# Patient Record
Sex: Female | Born: 1980 | Race: White | Hispanic: No | Marital: Married | Smoking: Current every day smoker
Health system: Southern US, Community
[De-identification: ages and names within clinical notes are randomized; demographics above are authoritative.]

## PROBLEM LIST (undated history)

## (undated) ENCOUNTER — Inpatient Hospital Stay (HOSPITAL_COMMUNITY): Payer: Self-pay

## (undated) DIAGNOSIS — I10 Essential (primary) hypertension: Secondary | ICD-10-CM

## (undated) DIAGNOSIS — F419 Anxiety disorder, unspecified: Secondary | ICD-10-CM

## (undated) DIAGNOSIS — F32A Depression, unspecified: Secondary | ICD-10-CM

## (undated) DIAGNOSIS — Z3490 Encounter for supervision of normal pregnancy, unspecified, unspecified trimester: Secondary | ICD-10-CM

## (undated) DIAGNOSIS — F909 Attention-deficit hyperactivity disorder, unspecified type: Secondary | ICD-10-CM

## (undated) DIAGNOSIS — N898 Other specified noninflammatory disorders of vagina: Principal | ICD-10-CM

## (undated) DIAGNOSIS — D0512 Intraductal carcinoma in situ of left breast: Secondary | ICD-10-CM

## (undated) DIAGNOSIS — F329 Major depressive disorder, single episode, unspecified: Secondary | ICD-10-CM

## (undated) DIAGNOSIS — Z87898 Personal history of other specified conditions: Secondary | ICD-10-CM

## (undated) DIAGNOSIS — B379 Candidiasis, unspecified: Secondary | ICD-10-CM

## (undated) HISTORY — DX: Candidiasis, unspecified: B37.9

## (undated) HISTORY — DX: Personal history of other specified conditions: Z87.898

## (undated) HISTORY — DX: Other specified noninflammatory disorders of vagina: N89.8

## (undated) HISTORY — DX: Essential (primary) hypertension: I10

## (undated) HISTORY — PX: CHOLECYSTECTOMY: SHX55

## (undated) HISTORY — DX: Encounter for supervision of normal pregnancy, unspecified, unspecified trimester: Z34.90

---

## 2001-06-03 ENCOUNTER — Emergency Department (HOSPITAL_COMMUNITY): Admission: EM | Admit: 2001-06-03 | Discharge: 2001-06-03 | Payer: Self-pay | Admitting: Internal Medicine

## 2001-06-03 ENCOUNTER — Inpatient Hospital Stay (HOSPITAL_COMMUNITY): Admission: EM | Admit: 2001-06-03 | Discharge: 2001-06-04 | Payer: Self-pay | Admitting: Emergency Medicine

## 2001-06-03 ENCOUNTER — Ambulatory Visit (HOSPITAL_COMMUNITY): Admission: RE | Admit: 2001-06-03 | Discharge: 2001-06-03 | Payer: Self-pay | Admitting: Internal Medicine

## 2001-06-03 ENCOUNTER — Encounter: Payer: Self-pay | Admitting: Internal Medicine

## 2006-06-26 ENCOUNTER — Emergency Department (HOSPITAL_COMMUNITY): Admission: EM | Admit: 2006-06-26 | Discharge: 2006-06-26 | Payer: Self-pay | Admitting: Emergency Medicine

## 2006-12-31 ENCOUNTER — Emergency Department (HOSPITAL_COMMUNITY): Admission: EM | Admit: 2006-12-31 | Discharge: 2007-01-01 | Payer: Self-pay | Admitting: Emergency Medicine

## 2007-06-21 ENCOUNTER — Ambulatory Visit: Payer: Self-pay | Admitting: Obstetrics and Gynecology

## 2007-06-21 ENCOUNTER — Inpatient Hospital Stay (HOSPITAL_COMMUNITY): Admission: AD | Admit: 2007-06-21 | Discharge: 2007-06-22 | Payer: Self-pay | Admitting: Obstetrics & Gynecology

## 2007-07-12 ENCOUNTER — Inpatient Hospital Stay (HOSPITAL_COMMUNITY): Admission: AD | Admit: 2007-07-12 | Discharge: 2007-07-13 | Payer: Self-pay | Admitting: Obstetrics and Gynecology

## 2007-07-14 ENCOUNTER — Inpatient Hospital Stay (HOSPITAL_COMMUNITY): Admission: AD | Admit: 2007-07-14 | Discharge: 2007-07-15 | Payer: Self-pay | Admitting: Obstetrics and Gynecology

## 2007-07-22 ENCOUNTER — Ambulatory Visit: Payer: Self-pay | Admitting: Family

## 2007-07-22 ENCOUNTER — Inpatient Hospital Stay (HOSPITAL_COMMUNITY): Admission: AD | Admit: 2007-07-22 | Discharge: 2007-07-24 | Payer: Self-pay | Admitting: Obstetrics & Gynecology

## 2007-08-03 ENCOUNTER — Inpatient Hospital Stay (HOSPITAL_COMMUNITY): Admission: AD | Admit: 2007-08-03 | Discharge: 2007-08-06 | Payer: Self-pay | Admitting: Obstetrics & Gynecology

## 2007-08-03 ENCOUNTER — Ambulatory Visit: Payer: Self-pay | Admitting: *Deleted

## 2007-08-04 ENCOUNTER — Encounter: Payer: Self-pay | Admitting: Obstetrics & Gynecology

## 2009-05-06 ENCOUNTER — Other Ambulatory Visit: Admission: RE | Admit: 2009-05-06 | Discharge: 2009-05-06 | Payer: Self-pay | Admitting: Obstetrics and Gynecology

## 2009-07-05 ENCOUNTER — Emergency Department (HOSPITAL_COMMUNITY): Admission: EM | Admit: 2009-07-05 | Discharge: 2009-07-06 | Payer: Self-pay | Admitting: Emergency Medicine

## 2009-08-17 ENCOUNTER — Ambulatory Visit: Payer: Self-pay | Admitting: Obstetrics and Gynecology

## 2009-08-17 ENCOUNTER — Inpatient Hospital Stay (HOSPITAL_COMMUNITY): Admission: AD | Admit: 2009-08-17 | Discharge: 2009-08-17 | Payer: Self-pay | Admitting: Obstetrics & Gynecology

## 2009-10-26 ENCOUNTER — Inpatient Hospital Stay (HOSPITAL_COMMUNITY): Admission: AD | Admit: 2009-10-26 | Discharge: 2009-10-26 | Payer: Self-pay | Admitting: Obstetrics and Gynecology

## 2009-11-22 ENCOUNTER — Ambulatory Visit: Payer: Self-pay | Admitting: Advanced Practice Midwife

## 2009-11-22 ENCOUNTER — Inpatient Hospital Stay (HOSPITAL_COMMUNITY): Admission: AD | Admit: 2009-11-22 | Discharge: 2009-11-23 | Payer: Self-pay | Admitting: Obstetrics & Gynecology

## 2009-11-30 ENCOUNTER — Emergency Department (HOSPITAL_COMMUNITY): Admission: EM | Admit: 2009-11-30 | Discharge: 2009-12-01 | Payer: Self-pay | Admitting: Emergency Medicine

## 2009-12-08 ENCOUNTER — Ambulatory Visit: Payer: Self-pay | Admitting: Obstetrics and Gynecology

## 2009-12-08 ENCOUNTER — Inpatient Hospital Stay (HOSPITAL_COMMUNITY): Admission: AD | Admit: 2009-12-08 | Discharge: 2009-12-08 | Payer: Self-pay | Admitting: Obstetrics & Gynecology

## 2009-12-15 ENCOUNTER — Inpatient Hospital Stay (HOSPITAL_COMMUNITY): Admission: AD | Admit: 2009-12-15 | Discharge: 2009-12-15 | Payer: Self-pay | Admitting: Obstetrics & Gynecology

## 2009-12-20 ENCOUNTER — Inpatient Hospital Stay (HOSPITAL_COMMUNITY): Admission: AD | Admit: 2009-12-20 | Discharge: 2009-12-22 | Payer: Self-pay | Admitting: Obstetrics and Gynecology

## 2009-12-20 ENCOUNTER — Ambulatory Visit: Payer: Self-pay | Admitting: Obstetrics and Gynecology

## 2010-08-30 LAB — URINE MICROSCOPIC-ADD ON

## 2010-08-30 LAB — BASIC METABOLIC PANEL
CO2: 24 mEq/L (ref 19–32)
Calcium: 8.8 mg/dL (ref 8.4–10.5)
Chloride: 106 mEq/L (ref 96–112)
GFR calc Af Amer: >60 mL/min (ref >60)
Sodium: 137 mEq/L (ref 135–145)

## 2010-08-30 LAB — RH IMMUNE GLOB WKUP(>/=20WKS)(NOT WOMEN'S HOSP): Fetal Screen: NEGATIVE

## 2010-08-30 LAB — URINALYSIS, ROUTINE W REFLEX MICROSCOPIC
Bilirubin Urine: NEGATIVE
Bilirubin Urine: NEGATIVE
Bilirubin Urine: NEGATIVE
Glucose, UA: NEGATIVE mg/dL
Glucose, UA: 100 mg/dL — AB
Ketones, ur: NEGATIVE mg/dL
Nitrite: NEGATIVE
Protein, ur: NEGATIVE mg/dL
Protein, ur: NEGATIVE mg/dL
Specific Gravity, Urine: 1.01 (ref 1.005–1.030)
Specific Gravity, Urine: 1.025 (ref 1.005–1.030)
Urobilinogen, UA: 0.2 mg/dL (ref 0.0–1.0)
Urobilinogen, UA: 0.2 mg/dL (ref 0.0–1.0)
Urobilinogen, UA: 0.2 mg/dL (ref 0.0–1.0)
pH: 6.5 (ref 5.0–8.0)
pH: 6.5 (ref 5.0–8.0)

## 2010-08-30 LAB — CBC
HCT: 36.8 % (ref 36.0–46.0)
Hemoglobin: 12.1 g/dL (ref 12.0–15.0)
Hemoglobin: 11.1 g/dL — ABNORMAL LOW (ref 12.0–15.0)
MCH: 27.6 pg (ref 26.0–34.0)
MCHC: 32.8 g/dL (ref 30.0–36.0)
MCHC: 33.1 g/dL (ref 30.0–36.0)
MCV: 84.2 fL (ref 78.0–100.0)
MCV: 84.6 fL (ref 78.0–100.0)
Platelets: 287 10*3/uL (ref 150–400)
RBC: 4.37 MIL/uL (ref 3.87–5.11)
RBC: 3.98 MIL/uL (ref 3.87–5.11)
RDW: 16.2 % — ABNORMAL HIGH (ref 11.5–15.5)
WBC: 11.5 10*3/uL — ABNORMAL HIGH (ref 4.0–10.5)
WBC: 14.1 10*3/uL — ABNORMAL HIGH (ref 4.0–10.5)

## 2010-08-30 LAB — DIFFERENTIAL
Basophils Relative: 0 % (ref 0–1)
Lymphs Abs: 2.6 10*3/uL (ref 0.7–4.0)
Monocytes Absolute: 1 10*3/uL (ref 0.1–1.0)
Monocytes Relative: 7 % (ref 3–12)
Neutro Abs: 10.4 10*3/uL — ABNORMAL HIGH (ref 1.7–7.7)

## 2010-08-30 LAB — RPR: RPR Ser Ql: NONREACTIVE

## 2010-08-31 LAB — URINALYSIS, ROUTINE W REFLEX MICROSCOPIC
Leukocytes, UA: NEGATIVE
Nitrite: NEGATIVE
Protein, ur: NEGATIVE mg/dL
Specific Gravity, Urine: 1.015 (ref 1.005–1.030)
Urobilinogen, UA: 1 mg/dL (ref 0.0–1.0)

## 2010-08-31 LAB — CBC
HCT: 32.8 % — ABNORMAL LOW (ref 36.0–46.0)
MCHC: 34.2 g/dL (ref 30.0–36.0)
MCV: 87.1 fL (ref 78.0–100.0)
Platelets: 275 10*3/uL (ref 150–400)

## 2010-08-31 LAB — URINE MICROSCOPIC-ADD ON

## 2010-09-06 LAB — CBC
Hemoglobin: 11.7 g/dL — ABNORMAL LOW (ref 12.0–15.0)
MCHC: 33.6 g/dL (ref 30.0–36.0)
Platelets: 338 10*3/uL (ref 150–400)
RDW: 13.4 % (ref 11.5–15.5)

## 2010-09-06 LAB — GLUCOSE, CAPILLARY: Glucose-Capillary: 92 mg/dL (ref 70–99)

## 2010-10-27 NOTE — Op Note (Signed)
[name], Marissa Reese          ACCOUNT NO.:  [id number]   MEDICAL RECORD NO.:  [id number]          PATIENT TYPE:  EMS   LOCATION:  MAJO                         FACILITY:  [facility]   PHYSICIAN:  Lucky Cowboy, MD         DATE OF BIRTH:  02-28-1981   DATE OF PROCEDURE:  12/31/2006  DATE OF DISCHARGE:                               OPERATIVE REPORT   PREOPERATIVE DIAGNOSIS:  Complex stellate forehead laceration.   POSTOPERATIVE DIAGNOSIS:  Complex stellate forehead laceration.   PROCEDURE:  Closure, complex stellate forehead laceration.   SURGEON:  Lucky Cowboy, MD   ANESTHESIA:  General.   ESTIMATED BLOOD LOSS:  Less than 30 mL.   SPECIMENS:  None.   COMPLICATIONS:  None.   INDICATIONS:  This patient is a 30 year old female who was hit in the  forehead with a foul ball at the [employer] game in the fourth inning  tonight.  There was no loss of consciousness.  She was sitting past  first base.  Dr. Margarita Grizzle, emergency room physician, requests  closure.   FINDINGS:  The wound was 2.5 to the right and 2.2 to the left, with an  inferior limb of 2 cm.  This made a Y-shaped laceration.   PROCEDURE:  The patient was seen in the emergency room.  The procedure  and anticipated scarring level was discussed with her.  The area was  first injected with 1% lidocaine with 1:100,000 of epinephrine.  The  total amount was 6 mL.  The wound was prepped with Betadine and draped  in the usual sterile fashion.  Saline irrigation was performed.  Monocryl 4-0 was then used in a simple interrupted buried fashion to  reapproximate the frontalis muscle.  In one location 4-0 Vicryl was  used.  The skin was closed in a simple interrupted fashion using 5-0  Prolene.  Bacitracin was applied.  The patient was discharged home.   DISCHARGE INSTRUCTIONS:  Tylenol as needed.  Check with obstetrician as  she is [redacted] weeks pregnant.  She needs to check with regard to the dose.  Keep wound dry until suture  removal.  Apply bacitracin ointment t.i.d.  elevate head and apply ice for the next 24 hours.  She will call the  office for suture removal in 5 days.      Lucky Cowboy, MD  Electronically Signed     SJ/MEDQ  D:  12/31/2006  T:  01/01/2007  Job:  [id number]   cc:   [facility]  Clifton Custard

## 2010-10-27 NOTE — H&P (Signed)
NAMEPIXIE, BURGENER          ACCOUNT NO.:  [id number]   MEDICAL RECORD NO.:  [id number]          PATIENT TYPE:  INP   LOCATION:  9151                          FACILITY:  [facility]   PHYSICIAN:  Tilda Burrow, M.D. DATE OF BIRTH:  12-Mar-1981   DATE OF ADMISSION:  07/22/2007  DATE OF DISCHARGE:  07/24/2007                              HISTORY & PHYSICAL   ADMISSION DIAGNOSES:  1. Pregnancy at [redacted] weeks gestation.  2. Gestational hypertension.  3. Cervical favorability.  4. Medically indicate induction of labor.   HISTORY OF PRESENT ILLNESS:  This 30 year old female gravida 1, para 0,  has been followed through our office with a pregnancy course notable for  the complication of gestational hypertension which was first noted at [redacted]  weeks gestation when blood pressures went from the first trimester  values of 112-120/60-72 to a high of 152/88 followed by blood pressures  of 140/80, 150/90, 158/100, etc., though none of them were accompanied  by proteinuria, headaches, scatoma or right upper quadrant pain.  She  has had 24 hour urine collections for total protein which have ranged  from 164 mg to 210 mg per day.  Early February, she did have  intermittent headaches and blurry vision, but liver function tests and  PIH labs remained normal.  At home, her blood pressures are consistently  90 diastolic.  The cervix has improved significantly.  It is now 2-3 cm,  20%, -2.  Case has been discussed by Dr. Silas Flood and myself.  I agree with  his opinion that delivery at this time is indicated.  The patient is  therefore scheduled for Foley bulb cervical ripening on Sunday, August 06, 2007 at which time she is [redacted] weeks gestation for Foley bulb cervical  ripening and pitocin induction of labor.  She has been seen in the  [facility] [place] once this week to rule out labor.   PAST MEDICAL HISTORY:  Benign.   PAST SURGICAL HISTORY:  Negative.   ALLERGIES:  NONE.   SOCIAL HISTORY:   Cigarettes:  1-2 cigarettes per day.  Alcohol and  recreational drugs denied.  She is married and unemployed.   LABORATORY DATA:  Prenatal labs as mentioned earlier include blood type  A- for which RhoGAM was managed through June 13, 2007, rubella  immune antigen present, hemoglobin 14, hematocrit 42.  Hepatitis, HIV,  RPR, GC and chlamydia all negative.  Repeat RPR negative x 2.  Glucose  tolerance test 138 mg percent.  As previously mentioned, PIH labs have  been checked multiple times over the past several weeks and have shown  normal proteinuria and normal liver function tests.  Platelets were  335,000 at last check.   PLAN:  Admit for induction at [redacted] weeks gestation due to concerns  associated with gestational hypertension progressing as she reaches her  due date.      Tilda Burrow, M.D.  Electronically Signed     JVF/MEDQ  D:  08/02/2007  T:  08/03/2007  Job:  [id number]   cc:   [facility]

## 2010-10-30 NOTE — Consult Note (Signed)
[facility]  Patient:    Marissa Reese, Marissa Reese Visit Number: [id number] MRN: [id number]          Service Type: EMS Location: ED Attending Physician:  Cassell Smiles. Dictated by:   Langley Gauss, M.D. Proc. Date: 06/03/01 Admit Date:  06/03/2001 Discharge Date: 06/03/2001   CC:         Patrica Duel, M.D.   Consultation Report  BRIEF HISTORY:  The patient is a 30 year old with a last menstrual period of May 14, 2001, not utilizing any birth control who presented to [facility] emergency room with the complaints of fever and flank pain.  She stated to me that this had been occurring x 5 days duration.  One episode of vomiting the day prior to admission.  Patients initial evaluation in the emergency room supported the diagnosis of pyelonephritis, however, notable also was a positive serum qualitative beta hCG which was obtained.  Thus the patient required a pelvic ultrasound to evaluate the status of the pregnancy in addition to a renal and gallbladder ultrasound.  Due to the nonavailability of [facility] to perform or provide any of these services on an after hours basis the patient was referred by ambulance to [facility] in [place], at which time the ultrasounds were performed as requested and then she was then returned to [facility] emergency room for continued care.  Review of the ER record indicates that the patient was placed in a treatment room at 01:25 a.m. at 03:00 due to the obtaining of a positive qualitative beta hCG. Dr. Sherwood Gambler discussed pregnancy with the family and possible infection of some kind.  At 03:10 Dr. Sherwood Gambler talked with radiology at [facility] to see about getting an ultrasound performed in the a.m.  At 06:32 communications to transfer the patient to [facility] for ultrasound and transfer back to [facility] were arranged.  The patient did complete paper work  for transfer to [facility] and back.  The patient had received IV fluids during the night as well as pain medication and thus pain was markedly diminished at this time.  The patient returned from [facility] at 10:30 a.m.  Travel from [facility] to [facility] 09:53 with arrival at 10:23.  The times states previously upon review indicate that the patient actually returned and arrived at [facility] emergency room at 10:23 a.m. with the report of a negative gallbladder and renal ultrasound and a hemorrhagic corpus luteum only identified on the pelvic ultrasound with no findings of pregnancy.  LABORATORY DATA:  Pertinent laboratory studies at this time included the previously mentioned qualitative serum hCG June 03, 2001, at 01:49 reported as positive.  Of note, at the same time June 03, 2001, at 01:49 a.m. collection qualitative beta hCG was reported as less than 2.  This was ordered at 10:31 a.m. on the blood which had been collected at 01:49 a.m.  Thus the two tests were run on the same blood sample with conflicting results. At that point in time when Dr. Nobie Putnam evaluated the patient in the emergency room it became somewhat apparent that she was not pregnant.  Prior to my seeing the patient on the third floor of the hospital another qualitative beta hCG was performed June 03, 2001, at 12:41, quantitative beta hCG was again less than 2.0.  This was reported at 13:23.  Due to the previously mentioned positive qualitative beta hCG now  conflicting results I was consulted to see the patient by Dr. Patrica Duel.  The patient was admitted by him with the diagnosis of pyelonephritis and sent to the third floor.  PAST MEDICAL HISTORY:  She denies any prior medical or surgical history.  She denies any medical illnesses.  CURRENT MEDICATIONS:  None.  ALLERGIES:  The patient states no known drug allergies.  SOCIAL HISTORY:  She is a  nonsmoker.  PHYSICAL EXAMINATION:  GENERAL:  Revealed a very lethargic sleepy patient on the third floor of the hospital.  VITAL SIGNS:  T-max had been 102.3, pulse of 101, respiratory rate 18, blood pressure 136/76.  HEENT:  Reveals no adenopathy.  NECK:  Supple.  Thyroid is nonpalpable.  Mucous membranes moist.  LUNGS/CARDIOVASCULAR:  Clear to auscultation.  Regular rate and rhythm of the heart.  No murmurs or gallops identified.  ABDOMEN:  Abdomen soft, nontender.  Likewise, pelvic area is soft and nontender with no pelvic or abdominal masses identified.  Very mild right adnexal tenderness suprapubically.  Right flank pain is identified.  LABORATORY DATA:  Quantitative and qualitative beta hCGs as discussed previously.  White blood count 10,500, 77 segs, 11 lymphs, 0 bands. Hemoglobin 13.5, hematocrit 38.7, electrolytes essentially within normal limits.  Urinalysis pertinent for rare epithelial cells.  Many bacteria. Trace esterase.  ASSESSMENT:  Initial presentation of the patient was very concerning for ectopic pregnancy particularly when the qualitative beta hCG was reported as positive.  The patient thereafter had a ultrasound revealing a hemorrhagic corpus luteum and no intrauterine pregnancy.  Again, markedly elevating our concern of an ectopic pregnancy, however, the quantitative beta hCG performed on the same blood obtained in the emergency room was now reported as less than 2.  Thus, completely ruling out an ectopic pregnancy.  However, due to conflicting results quantitative beta hCG was again requested by Dr. Patrica Duel and this was done on a new blood sample and again, was reported as less than 2.  Thus, predominance of the data specifically 2 quantitatives which were negative and 1 qualitative of positive indicates most likely patient is not pregnant, thus completely ruling out the ectopic pregnancy.  Patient, however,  is noted to have right  pyelonephritis with the nausea and vomiting and unable to tolerate p.o. thus she was admitted by Dr. Patrica Duel for intravenous fluid hydration as well as treatment with treatment with Rocephin.  When I was consulted and saw the patient on June 03, 2001, I agreed with that diagnosis and informed the patient of the conflicting laboratory results and gave her my final conclusion that she was not pregnant.  The patient did require treatment at that time for flank pain with p.o. Tylox for which she did very well.  The patient did well throughout the evening.  Did very well taking the p.o. Tylox for pain relief.  She was noted to have fever and chills that evening when I evaluated her.  HOSPITAL DAY #1: Subjective:  The patient is noted to be sleeping very soundly in the room stating that her pain is markedly improved with minimal right flank pain and ability to sleep very easily.  The patient denies any difficulty with voiding.  She denies any vaginal bleeding or vaginal discharge.  OBJECTIVE:  Temperature at 04:00 97.5 on June 03, 2001.  On December 22,2002, temperature 98.7, pulse 72, respiratory rate 18, blood pressure 101/59.  ABDOMEN: Soft, nontender.  No pelvic masses, no pelvic tenderness or abdominal tenderness elicited.  Markedly decreased  right flank pain.  LABORATORY DATA:  Accu-Chek 90.  I had requested that this a.m. a qualitative beta hCG be performed and if this was positive a quantitative beta hCG to be performed, however, these orders were not followed, rather only the results for a quantitative beta hCG are available which again reveal this to be less than 2.  Thus the third negative pregnancy test reveals the predominance of the available data to support the diagnosis that the patient is not pregnant thus intrauterine pregnancy and no possibility of an ectopic pregnancy.  Thus the diagnosis remains pyelonephritis.  Patient responding very well to the  IV Rocephin as well as the intravenous fluids for hydration therapy and flushing of the kidneys.  From a gynecological or obstetrical standpoint the patient is ready for discharge when her pyelonephritis is treatable as an outpatient.  Patient can follow up in our office if she desires for ongoing gynecological care and discussion of birth control if she would like to initiate its use.  I thank Dr. Nobie Putnam for asking me to consult on Jeremy Mclamb. Dictated by:   Langley Gauss, M.D. Attending Physician:  Cassell Smiles DD:  06/04/01 TD:  06/05/01 Job: [id number]

## 2010-10-30 NOTE — H&P (Signed)
[facility]  Patient:    Marissa Reese, Marissa Reese Visit Number: [id number] MRN: [id number]          Service Type: MED Location: 3A A313 01 Attending Physician:  Herbert Seta Dictated by:   Patrica Duel, M.D. Admit Date:  06/03/2001                           History and Physical  CHIEF COMPLAINT:  Fever and flank pain.  HISTORY OF PRESENT ILLNESS:  This is a 30 year old female with a completely negative past history.  The patient presented to the emergency department in the early morning hours complaining of right flank and back pain x 5 days.  She also had one episode of vomiting the day prior to admission and fever.  Preliminary evaluation revealed minimal pyuria but associated with a few rare epithelial cells and many bacteria on urinalysis.  She also had a minimal leukocytosis and a positive serum hCG.  Dr. Sherwood Gambler noticed a significant amount of right CVA tenderness.  She was sent to [facility] for emergent ultrasound of her liver, kidneys, gallbladder, and pelvis.  A verbal report received by Dr. Hulan Saas reported a hemorrhagic ovarian cyst on the right only.  Kidneys, ureters, liver, and gallbladder normal.  A quantitative hCG has been done which is less than 2, which brings in question the positive serum pregnancy test.  The patient denies significant urinary tract symptoms, vaginal discharge, chest pain, shortness of breath, headache, any neurologic deficits, syncope, melena, hematemesis, hematochezia.  The patient is admitted for apparent pyelonephritis and gynecologic evaluation.  PAST MEDICAL HISTORY:  As noted above.  ALLERGIES:  None known.  FAMILY HISTORY:  Noncontributory.  REVIEW OF SYSTEMS:  Negative except as mentioned.  PHYSICAL EXAMINATION:  GENERAL:  Very pleasant, alert female in no acute distress.  VITAL SIGNS:  At presentation temperature was 102.9, pulse 112, respirations 24, blood pressure 133/74, O2  saturation 98%.  Currently, temperature is 100 degrees, and other vital signs are stable.  HEENT:  Normocephalic, atraumatic.  Pupils are equal.  Ears, nose, throat benign.  NECK:  Supple.  No bruits, thyromegaly, or lymphadenopathy.  LUNGS:  Clear to A&P.  HEART:  Heart sounds are normal.  No murmurs, rubs, or gallops.  ABDOMEN:  Soft.  There is significant right CVA tenderness.  There is mild tenderness in the right suprapubic region.  PELVIC:  Deferred to Dr. Lisette Grinder.  EXTREMITIES:  No clubbing, cyanosis, or edema.  NEUROLOGIC:  Within normal limits.  LABORATORY DATA:  White count 10,500 with 77 segs, 11 lymphs, no bands.  H&H 13.5, 38.7.  Sodium 134, potassium 3.4, glucose 120, BUN 4, calcium 8.3, total protein 7.6, albumin 3.5.  Normal liver functions.  Urinalysis as noted. There is a small amount of hemoglobin, negative bilirubin.  The hCGs are as noted above.  ASSESSMENT:  Probable pyelonephritis with conflicting beta hCG results. Repeat quantitative hCG is currently pending.  Dr. Lisette Grinder has been notified.  PLAN:  Admit for IV Rocephin empirically.  Appropriate cultures have been obtained.  Will follow and treat expectantly. Dictated by:   Patrica Duel, M.D. Attending Physician:  Herbert Seta DD:  06/03/01 TD:  06/03/01 Job: [id number]

## 2010-10-30 NOTE — Discharge Summary (Signed)
NAMESHERREL, PLOCH          ACCOUNT NO.:  [id number]   MEDICAL RECORD NO.:  [id number]          PATIENT TYPE:  INP   LOCATION:  9151                          FACILITY:  [facility]   PHYSICIAN:  Lazaro Arms, M.D.   DATE OF BIRTH:  November 30, 1980   DATE OF ADMISSION:  07/22/2007  DATE OF DISCHARGE:  07/24/2007                               DISCHARGE SUMMARY   DISCHARGE DIAGNOSES:  1. Intrauterine pregnancy at 35+ weeks gestation.  2. Pregnancy-induced pure gestational hypertension.  3. False labor.   Please refer to the antepartum chart and the history and physical note  for details from this hospital.   HOSPITAL COURSE:  The patient was admitted having contractions fairly  regularly, but she got some pain medicine, they actually stopped and  with IV hydration, they also stopped.  Her cervix was checked multiple  times, it did not change.  She remained 2-3, 75% effaced and -2.  Her  blood pressure was elevated, which it had been, but her proteinuria has  been insignificant.  Her laboratory data was also normal.  We followed  her for now for 6-8 weeks with what appears to be true gestational  hypertension at this point.  Keep looking for the development of  preeclampsia, but she is not at that point yet.  As a result, she was  discharged to home.  She is to follow up in the office in 2 days.  She  will bring another 24-hour urine at that time.  She was given  instruction and precautions to return prior to that time.      Lazaro Arms, M.D.  Electronically Signed     LHE/MEDQ  D:  08/30/2007  T:  08/31/2007  Job:  [id number]

## 2010-10-30 NOTE — Discharge Summary (Signed)
[facility]  Patient:    Marissa Reese, Marissa Reese Visit Number: [id number] MRN: [id number]          Service Type: EMS Location: ED Attending Physician:  Cassell Smiles. Dictated by:   Patrica Duel, M.D. Admit Date:  06/03/2001 Discharge Date: 06/03/2001                             Discharge Summary  DISCHARGE DIAGNOSES: 1. Pyelonephritis, marked improvement. 2. Hemorrhagic ovarian cyst on right documented by ultrasound. 3. False positive pregnancy test.  HISTORY OF PRESENT ILLNESS:  For details regarding admission, please refer to admitting note.  Briefly, this 30 year old female with a completely benign past history presented to the emergency department complaining of a five day history of right flank and back pain.  She had had one episode of nausea and vomiting the day prior.  She also had intermittent fever.  The preliminary evaluation revealed minimal pyuria, but many bacteria on UA.  She had minimal leukocytosis and a positive serum hCG.  The emergency room physician noted a significant amount of right CVA tenderness.  Due to the unavailability of ultrasound capabilities during the weekend hours, the patient was transferred to [facility] for a STAT ultrasound of her liver, kidneys, and pelvis.  The results were entirely benign except for a right hemorrhagic ovarian cyst.  She was brought back to the emergency room by ambulance.  Of note, the serum pregnancy test was positive initially.  A quantitative hCG was obtained which was less than 2.  This was repeated and verified.  Dr. Lisette Grinder was contacted who felt the patient was not pregnant.  HOSPITAL COURSE:  Patient did very well with IV Rocephin and fluids and currently is eating and strongly requesting discharge.  Aside from minimal right CVA tenderness, the patient is doing well.  She has no fever.  Vital signs are stable.  She is ready for discharge to be followed as an outpatient and  readmitted if necessary.  DISPOSITION:  Cipro 500 mg p.o. b.i.d.  Phenergan p.r.n.  Fluids.  She is to return promptly to the hospital or call me should any further problems arise. Follow up one week in the office otherwise. Dictated by:   Patrica Duel, M.D. Attending Physician:  Cassell Smiles DD:  06/04/01 TD:  06/05/01 Job: [id number]

## 2010-12-13 ENCOUNTER — Emergency Department (HOSPITAL_COMMUNITY)
Admission: EM | Admit: 2010-12-13 | Discharge: 2010-12-13 | Disposition: A | Discharge status: Home / Self Care | Payer: BC Managed Care – PPO | Attending: Emergency Medicine | Admitting: Emergency Medicine

## 2010-12-13 ENCOUNTER — Emergency Department (HOSPITAL_COMMUNITY): Payer: BC Managed Care – PPO

## 2010-12-13 DIAGNOSIS — R51 Headache: Secondary | ICD-10-CM | POA: Insufficient documentation

## 2010-12-13 DIAGNOSIS — R079 Chest pain, unspecified: Secondary | ICD-10-CM | POA: Insufficient documentation

## 2011-02-04 ENCOUNTER — Emergency Department (HOSPITAL_COMMUNITY): Payer: BC Managed Care – PPO

## 2011-02-04 ENCOUNTER — Emergency Department (HOSPITAL_COMMUNITY)
Admission: EM | Admit: 2011-02-04 | Discharge: 2011-02-05 | Disposition: A | Discharge status: Home / Self Care | Payer: BC Managed Care – PPO | Attending: Emergency Medicine | Admitting: Emergency Medicine

## 2011-02-04 ENCOUNTER — Encounter: Payer: Self-pay | Admitting: Emergency Medicine

## 2011-02-04 DIAGNOSIS — R509 Fever, unspecified: Secondary | ICD-10-CM | POA: Insufficient documentation

## 2011-02-04 DIAGNOSIS — R1011 Right upper quadrant pain: Secondary | ICD-10-CM | POA: Insufficient documentation

## 2011-02-04 DIAGNOSIS — R112 Nausea with vomiting, unspecified: Secondary | ICD-10-CM | POA: Insufficient documentation

## 2011-02-04 DIAGNOSIS — F17200 Nicotine dependence, unspecified, uncomplicated: Secondary | ICD-10-CM | POA: Insufficient documentation

## 2011-02-04 DIAGNOSIS — R110 Nausea: Secondary | ICD-10-CM

## 2011-02-04 LAB — CBC
HCT: 44.3 % (ref 36.0–46.0)
Hemoglobin: 14.3 g/dL (ref 12.0–15.0)
MCV: 89.9 fL (ref 78.0–100.0)
WBC: 10.9 10*3/uL — ABNORMAL HIGH (ref 4.0–10.5)

## 2011-02-04 LAB — URINALYSIS, ROUTINE W REFLEX MICROSCOPIC
Bilirubin Urine: NEGATIVE
Nitrite: NEGATIVE
Protein, ur: NEGATIVE mg/dL
Specific Gravity, Urine: 1.01 (ref 1.005–1.030)
Urobilinogen, UA: 0.2 mg/dL (ref 0.0–1.0)

## 2011-02-04 LAB — URINE MICROSCOPIC-ADD ON

## 2011-02-04 LAB — COMPREHENSIVE METABOLIC PANEL
Alkaline Phosphatase: 130 U/L — ABNORMAL HIGH (ref 39–117)
BUN: 7 mg/dL (ref 6–23)
Chloride: 103 mEq/L (ref 96–112)
Creatinine, Ser: 0.68 mg/dL (ref 0.50–1.10)
GFR calc Af Amer: >60 mL/min (ref >60)
GFR calc non Af Amer: >60 mL/min (ref >60)
Glucose, Bld: 86 mg/dL (ref 70–99)
Potassium: 4 mEq/L (ref 3.5–5.1)
Total Bilirubin: 0.3 mg/dL (ref 0.3–1.2)

## 2011-02-04 LAB — PREGNANCY, URINE: Preg Test, Ur: NEGATIVE

## 2011-02-04 MED ORDER — SODIUM CHLORIDE 0.9 % IV BOLUS (SEPSIS)
1000.0000 mL | Freq: Once | INTRAVENOUS | Status: AC
  Administered 2011-02-04: 1000 mL via INTRAVENOUS

## 2011-02-04 MED ORDER — ACETAMINOPHEN 325 MG PO TABS
975.0000 mg | ORAL_TABLET | Freq: Four times a day (QID) | ORAL | Status: DC | PRN
  Administered 2011-02-04: 975 mg via ORAL
  Filled 2011-02-04: qty 3

## 2011-02-04 MED ORDER — ONDANSETRON HCL 4 MG/2ML IJ SOLN
4.0000 mg | Freq: Once | INTRAMUSCULAR | Status: AC
  Administered 2011-02-04: 4 mg via INTRAVENOUS
  Filled 2011-02-04: qty 2

## 2011-02-04 NOTE — ED Notes (Signed)
Pt also complaining of headache - offered ice pack but pt states she'll just wait for MD

## 2011-02-04 NOTE — ED Notes (Signed)
Patient c/o fever since last night; states grandmother gave her Motrin and she vomited after taking it.

## 2011-02-04 NOTE — ED Provider Notes (Signed)
History   Chart scribed for Lyanne Co, MD by Lyanne Co; the patient was seen in room APA03/APA03; this patient's care was started at 10:23 PM.    CSN: [id number] Arrival date & time: 02/04/2011  9:18 PM  Chief Complaint  Patient presents with  . Fever  . Emesis   HPI Marissa Reese is a 30 y.o. female who presents to the Emergency Department complaining of persistent fever. Pt reports fever yesterday, highest of 104 last night, with associated back pain and decreased appetite; approx 2 hours ago nausea onset with several episodes of nonbloody and nonbilious vomiting and now also c/o headache. Has tried motrin with no relief. No diarrhea, dysuria, abd pain. Reports productive cough x 1 week. No cp or sob. No rash. Nothing improves symptoms. Nothing worsens  History reviewed. No pertinent past medical history.  History reviewed. No pertinent past surgical history.  No family history on file.  History  Substance Use Topics  . Smoking status: Current Everyday Smoker -- 1.0 packs/day    Types: Cigarettes  . Smokeless tobacco: Not on file  . Alcohol Use: Yes     occ    OB History    Grav Para Term Preterm Abortions TAB SAB Ect Mult Living                  Review of Systems 10 Systems reviewed and are negative for acute change except as noted in the HPI.  Physical Exam  BP 112/65  Pulse 93  Temp(Src) 99.5 F (37.5 C) (Oral)  Resp 19  Ht 5' (1.524 m)  Wt 150 lb (68.04 kg)  BMI 29.30 kg/m2  SpO2 98%  LMP 01/28/2011  Physical Exam  Nursing note and vitals reviewed. Constitutional: She is oriented to person, place, and time. She appears well-developed and well-nourished. No distress.  HENT:  Head: Normocephalic.  Mouth/Throat: Mucous membranes are normal.  Eyes: EOM are normal.       Normal appearance  Neck: Normal range of motion. Neck supple.  Cardiovascular: Normal rate and regular rhythm.  Exam reveals no gallop and no friction rub.   No murmur  heard. Pulmonary/Chest: Effort normal and breath sounds normal. She has no wheezes. She has no rhonchi. She has no rales.  Abdominal: Soft. There is no tenderness.       Tenderness RUQ; without guarding or rebound  Musculoskeletal: Normal range of motion. She exhibits no edema and no tenderness.       Normal appearance  Neurological: She is alert and oriented to person, place, and time.       Motor intact in all extremities  Skin: Skin is warm and dry. No rash noted.       Color normal  Psychiatric: She has a normal mood and affect.   Procedures - none  OTHER DATA REVIEWED: Nursing notes and vital signs reviewed. Prior records reviewed.   LABS / RADIOLOGY: I reviewed all labs and imaging completed today. I personally reviewed the images.   Dg Chest 2 View  02/04/2011  *RADIOLOGY REPORT*  Clinical Data: Persistent fever; back pain and decreased appetite. Nausea and vomiting.  Productive cough.  CHEST - 2 VIEW  Comparison: Chest radiograph performed 12/13/2010  Findings: The lungs are well-aerated.  Chronic peribronchial thickening is noted.  There is no evidence of focal opacification, pleural effusion or pneumothorax.  The heart is normal in size; the mediastinal contour is within normal limits.  No acute osseous abnormalities are seen.  IMPRESSION:  Chronic peribronchial thickening noted; lungs otherwise clear.  Original Report Authenticated By: Tonia Ghent, M.D.    Results for orders placed during the hospital encounter of 02/04/11  URINALYSIS, ROUTINE W REFLEX MICROSCOPIC      Component Value Range   Color, Urine AMBER (*) YELLOW    Appearance CLEAR  CLEAR    Specific Gravity, Urine 1.010  1.005 - 1.030    pH 7.0  5.0 - 8.0    Glucose, UA NEGATIVE  NEGATIVE (mg/dL)   Hgb urine dipstick MODERATE (*) NEGATIVE    Bilirubin Urine NEGATIVE  NEGATIVE    Ketones, ur NEGATIVE  NEGATIVE (mg/dL)   Protein, ur NEGATIVE  NEGATIVE (mg/dL)   Urobilinogen, UA 0.2  0.0 - 1.0 (mg/dL)    Nitrite NEGATIVE  NEGATIVE    Leukocytes, UA SMALL (*) NEGATIVE   URINE MICROSCOPIC-ADD ON      Component Value Range   Squamous Epithelial / LPF MANY (*) RARE    WBC, UA 3-6  <3 (WBC/hpf)   RBC / HPF 7-10  <3 (RBC/hpf)   Bacteria, UA MANY (*) RARE   PREGNANCY, URINE      Component Value Range   Preg Test, Ur NEGATIVE    CBC      Component Value Range   WBC 10.9 (*) 4.0 - 10.5 (K/uL)   RBC 4.93  3.87 - 5.11 (MIL/uL)   Hemoglobin 14.3  12.0 - 15.0 (g/dL)   HCT 72.5  36.6 - 44.0 (%)   MCV 89.9  78.0 - 100.0 (fL)   MCH 29.0  26.0 - 34.0 (pg)   MCHC 32.3  30.0 - 36.0 (g/dL)   RDW 34.7  42.5 - 95.6 (%)   Platelets 325  150 - 400 (K/uL)  COMPREHENSIVE METABOLIC PANEL      Component Value Range   Sodium 140  135 - 145 (mEq/L)   Potassium 4.0  3.5 - 5.1 (mEq/L)   Chloride 103  96 - 112 (mEq/L)   CO2 27  19 - 32 (mEq/L)   Glucose, Bld 86  70 - 99 (mg/dL)   BUN 7  6 - 23 (mg/dL)   Creatinine, Ser 3.87  0.50 - 1.10 (mg/dL)   Calcium 9.4  8.4 - 56.4 (mg/dL)   Total Protein 8.0  6.0 - 8.3 (g/dL)   Albumin 4.0  3.5 - 5.2 (g/dL)   AST 15  0 - 37 (U/L)   ALT 18  0 - 35 (U/L)   Alkaline Phosphatase 130 (*) 39 - 117 (U/L)   Total Bilirubin 0.3  0.3 - 1.2 (mg/dL)   GFR calc non Af Amer >60  >60 (mL/min)   GFR calc Af Amer >60  >60 (mL/min)  LIPASE, BLOOD      Component Value Range   Lipase 32  11 - 59 (U/L)     MDM: Pt with fever and nausea and vomiting. No significant complaints of abdominal pain, however the pt is tender in her RUQ. CT scan pending to evaluate for cholecystitis, however, her labs look pretty good. My suspicion is low, however without another source for the fever I think this is a very reasonable. Pt reports she feels much better now (12:40 AM). She looks much better. Will recheck her vitals at this time. Pt has returned from CT. Will await results  Care to Dr Colon Branch at approx 1250pm   SCRIBE ATTESTATION: I personally performed the services described in this  documentation, which was scribed in my presence. The recorded  information has been reviewed and considered. No att. providers found        Lyanne Co, MD 02/05/11 [phone]

## 2011-02-05 ENCOUNTER — Emergency Department (HOSPITAL_COMMUNITY): Payer: BC Managed Care – PPO

## 2011-02-05 MED ORDER — SODIUM CHLORIDE 0.9 % IV BOLUS (SEPSIS)
1000.0000 mL | Freq: Once | INTRAVENOUS | Status: AC
  Administered 2011-02-05: 1000 mL via INTRAVENOUS

## 2011-02-05 MED ORDER — IOHEXOL 300 MG/ML  SOLN
100.0000 mL | Freq: Once | INTRAMUSCULAR | Status: AC | PRN
  Administered 2011-02-05: 100 mL via INTRAVENOUS

## 2011-02-05 MED ORDER — ONDANSETRON HCL 4 MG PO TABS: 4.0000 mg | ORAL_TABLET | Freq: Four times a day (QID) | ORAL | Status: AC

## 2011-02-05 NOTE — Progress Notes (Signed)
61 Assumed care/disposition of patient. Patient presented with fever. PE significant for mild RUQ abdominal pain. CT to r/o abscess or possible GB dz. CT negative. Reviewed results with patient.Pt feels improved after observation and/or treatment in ED.

## 2011-02-06 ENCOUNTER — Encounter (HOSPITAL_COMMUNITY): Payer: Self-pay

## 2011-02-06 ENCOUNTER — Emergency Department (HOSPITAL_COMMUNITY)
Admission: EM | Admit: 2011-02-06 | Discharge: 2011-02-07 | Disposition: A | Discharge status: Home / Self Care | Payer: BC Managed Care – PPO | Attending: Emergency Medicine | Admitting: Emergency Medicine

## 2011-02-06 DIAGNOSIS — IMO0001 Reserved for inherently not codable concepts without codable children: Secondary | ICD-10-CM | POA: Insufficient documentation

## 2011-02-06 DIAGNOSIS — F17200 Nicotine dependence, unspecified, uncomplicated: Secondary | ICD-10-CM | POA: Insufficient documentation

## 2011-02-06 DIAGNOSIS — F341 Dysthymic disorder: Secondary | ICD-10-CM | POA: Insufficient documentation

## 2011-02-06 DIAGNOSIS — R1110 Vomiting, unspecified: Secondary | ICD-10-CM | POA: Insufficient documentation

## 2011-02-06 DIAGNOSIS — I10 Essential (primary) hypertension: Secondary | ICD-10-CM | POA: Insufficient documentation

## 2011-02-06 DIAGNOSIS — R509 Fever, unspecified: Secondary | ICD-10-CM | POA: Insufficient documentation

## 2011-02-06 HISTORY — DX: Depression, unspecified: F32.A

## 2011-02-06 HISTORY — DX: Major depressive disorder, single episode, unspecified: F32.9

## 2011-02-06 HISTORY — DX: Essential (primary) hypertension: I10

## 2011-02-06 HISTORY — DX: Anxiety disorder, unspecified: F41.9

## 2011-02-06 LAB — DIFFERENTIAL
Lymphs Abs: 1.5 10*3/uL (ref 0.7–4.0)
Monocytes Relative: 10 % (ref 3–12)
Neutro Abs: 7.6 10*3/uL (ref 1.7–7.7)
Neutrophils Relative %: 75 % (ref 43–77)

## 2011-02-06 LAB — CBC
Hemoglobin: 13.3 g/dL (ref 12.0–15.0)
Platelets: 292 10*3/uL (ref 150–400)
RBC: 4.58 MIL/uL (ref 3.87–5.11)
WBC: 10.2 10*3/uL (ref 4.0–10.5)

## 2011-02-06 LAB — URINALYSIS, ROUTINE W REFLEX MICROSCOPIC
Bilirubin Urine: NEGATIVE
Nitrite: NEGATIVE
Specific Gravity, Urine: 1.02 (ref 1.005–1.030)
pH: 7 (ref 5.0–8.0)

## 2011-02-06 LAB — URINE MICROSCOPIC-ADD ON

## 2011-02-06 LAB — URINE CULTURE

## 2011-02-06 MED ORDER — SODIUM CHLORIDE 0.9 % IV BOLUS (SEPSIS)
1000.0000 mL | Freq: Once | INTRAVENOUS | Status: AC
  Administered 2011-02-06: 1000 mL via INTRAVENOUS

## 2011-02-06 MED ORDER — ONDANSETRON 4 MG PO TBDP
4.0000 mg | ORAL_TABLET | Freq: Once | ORAL | Status: AC
  Administered 2011-02-06: 4 mg via ORAL
  Filled 2011-02-06: qty 1

## 2011-02-06 MED ORDER — ACETAMINOPHEN 325 MG PO TABS: 650.0000 mg | ORAL_TABLET | Freq: Once | ORAL | Status: DC

## 2011-02-06 MED ORDER — ACETAMINOPHEN 500 MG PO TABS
ORAL_TABLET | ORAL | Status: AC
  Administered 2011-02-06: 1000 mg via ORAL
  Filled 2011-02-06: qty 2

## 2011-02-06 NOTE — ED Provider Notes (Signed)
History     CSN: [id number] Arrival date & time: 02/06/2011  9:05 PM  Chief Complaint  Patient presents with  . Fever  . Emesis   Patient is a 30 y.o. female presenting with fever and vomiting. The history is provided by the patient.  Fever Primary symptoms of the febrile illness include fever, vomiting and myalgias. Primary symptoms do not include headaches, shortness of breath, abdominal pain, dysuria or rash. The current episode started 3 to 5 days ago. This is a recurrent problem. Progression since onset: symptoms on and off was better now returning.  The fever began today. The fever has been unchanged since its onset. The maximum temperature recorded prior to her arrival was 103 to 104 F.  Associated with: no ABx, no tick exposure, has pets at home, no sick contacts, no travel.   Emesis  Associated symptoms include a fever and myalgias. Pertinent negatives include no abdominal pain, no chills and no headaches.  PT was seen here for the same a few days ago and had labs and Ct scan that did not reveal an infectious source.    Past Medical History  Diagnosis Date  . Hypertension   . Headache   . Depression   . Anxiety     History reviewed. No pertinent past surgical history.  No family history on file.  History  Substance Use Topics  . Smoking status: Current Everyday Smoker -- 1.0 packs/day    Types: Cigarettes  . Smokeless tobacco: Not on file  . Alcohol Use: Yes     occ    OB History    Grav Para Term Preterm Abortions TAB SAB Ect Mult Living                  Review of Systems  Constitutional: Positive for fever. Negative for chills.  HENT: Negative for neck pain and neck stiffness.   Eyes: Negative for pain.  Respiratory: Negative for shortness of breath.   Cardiovascular: Negative for chest pain.  Gastrointestinal: Positive for vomiting. Negative for abdominal pain.  Genitourinary: Negative for dysuria.  Musculoskeletal: Positive for myalgias. Negative for  back pain.  Skin: Negative for rash.  Neurological: Negative for headaches.  All other systems reviewed and are negative.    Physical Exam  BP 113/68  Pulse 114  Temp(Src) 100.9 F (38.3 C) (Oral)  Resp 26  Ht 5\' 1"  (1.549 m)  Wt 150 lb (68.04 kg)  BMI 28.34 kg/m2  SpO2 95%  LMP 01/28/2011  Physical Exam  Constitutional: She is oriented to person, place, and time. She appears well-developed and well-nourished.  HENT:  Head: Normocephalic and atraumatic.  Eyes: Conjunctivae and EOM are normal. Pupils are equal, round, and reactive to light.  Neck: Trachea normal and full passive range of motion without pain. Neck supple. No thyromegaly present.       No meningismus  Cardiovascular: Normal rate, regular rhythm, S1 normal, S2 normal, intact distal pulses and normal pulses.     No systolic murmur is present   No diastolic murmur is present  Pulses:      Radial pulses are 2+ on the right side, and 2+ on the left side.  Pulmonary/Chest: Effort normal and breath sounds normal. She has no wheezes. She has no rhonchi. She has no rales. She exhibits no tenderness.  Abdominal: Soft. Normal appearance and bowel sounds are normal. There is no tenderness. There is no CVA tenderness and negative Murphy's sign.  Musculoskeletal: Normal range of motion.  BLE:s Calves nontender, no cords or erythema, negative Homans sign  Neurological: She is alert and oriented to person, place, and time. She has normal strength and normal reflexes. No cranial nerve deficit or sensory deficit. She displays a negative Romberg sign. GCS eye subscore is 4. GCS verbal subscore is 5. GCS motor subscore is 6.       Normal Gait  Skin: Skin is warm and dry. No rash noted. She is not diaphoretic. No cyanosis. Nails show no clubbing.  Psychiatric: She has a normal mood and affect. Her speech is normal and behavior is normal.       Cooperative and appropriate    ED Course  Procedures  MDM Symptoms suggest  viral syndrome, no tick bites or evidence of RMSF, no meningismus, no pharyngitis, no ABD pain or peritonitis serial exams, no rash. Prior CXR and Ct reviewed. IVFs and tylenol for symptoms. On recheck is feeling better, nausea resolved, myalgias improving, has PCP follow up and is stable for discharge home.   Results for orders placed during the hospital encounter of 02/06/11 (from the past 24 hour(s))  CBC     Status: Normal   Collection Time   02/06/11 11:12 PM      Component Value Range   WBC 10.2  4.0 - 10.5 (K/uL)   RBC 4.58  3.87 - 5.11 (MIL/uL)   Hemoglobin 13.3  12.0 - 15.0 (g/dL)   HCT 40.9  81.1 - 91.4 (%)   MCV 89.3  78.0 - 100.0 (fL)   MCH 29.0  26.0 - 34.0 (pg)   MCHC 32.5  30.0 - 36.0 (g/dL)   RDW 78.2  95.6 - 21.3 (%)   Platelets 292  150 - 400 (K/uL)  DIFFERENTIAL     Status: Normal   Collection Time   02/06/11 11:12 PM      Component Value Range   Neutrophils Relative 75  43 - 77 (%)   Neutro Abs 7.6  1.7 - 7.7 (K/uL)   Lymphocytes Relative 15  12 - 46 (%)   Lymphs Abs 1.5  0.7 - 4.0 (K/uL)   Monocytes Relative 10  3 - 12 (%)   Monocytes Absolute 1.0  0.1 - 1.0 (K/uL)   Eosinophils Relative 0  0 - 5 (%)   Eosinophils Absolute 0.0  0.0 - 0.7 (K/uL)   Basophils Relative 0  0 - 1 (%)   Basophils Absolute 0.0  0.0 - 0.1 (K/uL)  COMPREHENSIVE METABOLIC PANEL     Status: Abnormal   Collection Time   02/06/11 11:12 PM      Component Value Range   Sodium 134 (*) 135 - 145 (mEq/L)   Potassium 3.4 (*) 3.5 - 5.1 (mEq/L)   Chloride 101  96 - 112 (mEq/L)   CO2 22  19 - 32 (mEq/L)   Glucose, Bld 103 (*) 70 - 99 (mg/dL)   BUN 7  6 - 23 (mg/dL)   Creatinine, Ser 0.86  0.50 - 1.10 (mg/dL)   Calcium 8.6  8.4 - 57.8 (mg/dL)   Total Protein 7.4  6.0 - 8.3 (g/dL)   Albumin 3.3 (*) 3.5 - 5.2 (g/dL)   AST 25  0 - 37 (U/L)   ALT 27  0 - 35 (U/L)   Alkaline Phosphatase 115  39 - 117 (U/L)   Total Bilirubin 0.3  0.3 - 1.2 (mg/dL)   GFR calc non Af Amer >60  >60 (mL/min)   GFR  calc Af Amer >60  >  60 (mL/min)  LIPASE, BLOOD     Status: Normal   Collection Time   02/06/11 11:12 PM      Component Value Range   Lipase 22  11 - 59 (U/L)  URINALYSIS, ROUTINE W REFLEX MICROSCOPIC     Status: Abnormal   Collection Time   02/06/11 11:33 PM      Component Value Range   Color, Urine YELLOW  YELLOW    Appearance CLEAR  CLEAR    Specific Gravity, Urine 1.020  1.005 - 1.030    pH 7.0  5.0 - 8.0    Glucose, UA 100 (*) NEGATIVE (mg/dL)   Hgb urine dipstick MODERATE (*) NEGATIVE    Bilirubin Urine NEGATIVE  NEGATIVE    Ketones, ur TRACE (*) NEGATIVE (mg/dL)   Protein, ur TRACE (*) NEGATIVE (mg/dL)   Urobilinogen, UA 0.2  0.0 - 1.0 (mg/dL)   Nitrite NEGATIVE  NEGATIVE    Leukocytes, UA SMALL (*) NEGATIVE   URINE MICROSCOPIC-ADD ON     Status: Abnormal   Collection Time   02/06/11 11:33 PM      Component Value Range   Squamous Epithelial / LPF MANY (*) RARE    WBC, UA 3-6  <3 (WBC/hpf)   RBC / HPF 7-10  <3 (RBC/hpf)   Bacteria, UA MANY (*) RARE    Dg Chest 2 View  02/04/2011  *RADIOLOGY REPORT*  Clinical Data: Persistent fever; back pain and decreased appetite. Nausea and vomiting.  Productive cough.  CHEST - 2 VIEW  Comparison: Chest radiograph performed 12/13/2010  Findings: The lungs are well-aerated.  Chronic peribronchial thickening is noted.  There is no evidence of focal opacification, pleural effusion or pneumothorax.  The heart is normal in size; the mediastinal contour is within normal limits.  No acute osseous abnormalities are seen.  IMPRESSION: Chronic peribronchial thickening noted; lungs otherwise clear.  Original Report Authenticated By: Tonia Ghent, M.D.   Ct Abdomen Pelvis W Contrast  02/05/2011  *RADIOLOGY REPORT*  Clinical Data: Right upper quadrant abdominal pain, nausea, vomiting and fever.  Back pain.  Decreased appetite.  CT ABDOMEN AND PELVIS WITH CONTRAST  Technique:  Multidetector CT imaging of the abdomen and pelvis was performed following the  standard protocol during bolus administration of intravenous contrast.  Contrast: 100 mL of Omnipaque 300 IV contrast  Comparison: None.  Findings: The visualized lung bases are clear.  The liver and spleen are unremarkable in appearance.  The gallbladder is partially decompressed and within normal limits. The pancreas and adrenal glands are unremarkable.  The kidneys are within normal limits bilaterally; no hydronephrosis or perinephric stranding is seen.  No renal or ureteral stones are identified.  There is mild nonspecific prominence of the proximal right ureter, without evidence of distal obstruction.  No free fluid is identified.  The small bowel is unremarkable in appearance.  The stomach is within normal limits.  No acute vascular abnormalities are seen.  The appendix within normal in caliber, without evidence for appendicitis.  The colon is partially filled with stool and is unremarkable in appearance.  The bladder is significantly distended and within normal limits. The uterus is unremarkable in appearance.  The ovaries are relatively symmetric; the right ovary is noted at the midline, superior to the bladder.  No suspicious adnexal masses are seen. No inguinal lymphadenopathy is seen.  No acute osseous abnormalities are identified.  Mild benign- appearing sclerosis is noted at the right side of the pubic symphysis.  IMPRESSION: Unremarkable contrast CT  of the abdomen and pelvis.  Original Report Authenticated By: Tonia Ghent, M.D.       Sunnie Nielsen, MD 02/07/11 [phone]

## 2011-02-06 NOTE — ED Notes (Signed)
Fever since Wednesday night, was seen in er for same Thursday, has not followed up w/ pmd, has not vomited since Thursday. Cont. To have fever. Unsure of last time she has something for fever.

## 2011-02-06 NOTE — ED Notes (Signed)
MD informed of Tylenol administration when pt arrived

## 2011-02-07 LAB — COMPREHENSIVE METABOLIC PANEL
ALT: 27 U/L (ref 0–35)
Albumin: 3.3 g/dL — ABNORMAL LOW (ref 3.5–5.2)
Alkaline Phosphatase: 115 U/L (ref 39–117)
BUN: 7 mg/dL (ref 6–23)
Chloride: 101 mEq/L (ref 96–112)
GFR calc Af Amer: >60 mL/min (ref >60)
Glucose, Bld: 103 mg/dL — ABNORMAL HIGH (ref 70–99)
Potassium: 3.4 mEq/L — ABNORMAL LOW (ref 3.5–5.1)
Sodium: 134 mEq/L — ABNORMAL LOW (ref 135–145)
Total Bilirubin: 0.3 mg/dL (ref 0.3–1.2)

## 2011-02-07 LAB — LIPASE, BLOOD: Lipase: 22 U/L (ref 11–59)

## 2011-03-04 LAB — COMPREHENSIVE METABOLIC PANEL
ALT: 13
ALT: 12
AST: 14
Albumin: 2.6 — ABNORMAL LOW
Alkaline Phosphatase: 95
Alkaline Phosphatase: 134 — ABNORMAL HIGH
BUN: 2 — ABNORMAL LOW
BUN: 3 — ABNORMAL LOW
CO2: 26
CO2: 25
Calcium: 9.4
Calcium: 9
Chloride: 105
Creatinine, Ser: 0.37 — ABNORMAL LOW
GFR calc Af Amer: >60
GFR calc non Af Amer: >60
GFR calc non Af Amer: >60
Glucose, Bld: 91
Glucose, Bld: 84
Potassium: 3.5
Potassium: 3.4 — ABNORMAL LOW
Potassium: 4.1
Sodium: 137
Sodium: 138
Total Bilirubin: 0.5
Total Protein: 5.9 — ABNORMAL LOW

## 2011-03-04 LAB — URINALYSIS, ROUTINE W REFLEX MICROSCOPIC
Bilirubin Urine: NEGATIVE
Bilirubin Urine: NEGATIVE
Ketones, ur: NEGATIVE
Ketones, ur: NEGATIVE
Nitrite: NEGATIVE
Protein, ur: NEGATIVE
Protein, ur: NEGATIVE
Urobilinogen, UA: 0.2
pH: 7

## 2011-03-04 LAB — URINALYSIS, DIPSTICK ONLY
Glucose, UA: NEGATIVE
Ketones, ur: NEGATIVE
Leukocytes, UA: NEGATIVE
Protein, ur: NEGATIVE
Urobilinogen, UA: 0.2

## 2011-03-04 LAB — CBC
HCT: 35.3 — ABNORMAL LOW
HCT: 36
Hemoglobin: 12.1
Hemoglobin: 12.5
MCHC: 34.8
MCHC: 34.6
MCV: 88.4
Platelets: 329
RBC: 3.9
RBC: 4.12
RDW: 13.3
WBC: 13.3 — ABNORMAL HIGH

## 2011-03-04 LAB — URIC ACID
Uric Acid, Serum: 3.7
Uric Acid, Serum: 3.4

## 2011-03-04 LAB — LACTATE DEHYDROGENASE: LDH: 108

## 2011-03-04 LAB — URINE MICROSCOPIC-ADD ON

## 2011-03-05 LAB — URINALYSIS, ROUTINE W REFLEX MICROSCOPIC
Glucose, UA: 100 — AB
Ketones, ur: NEGATIVE
Protein, ur: NEGATIVE
Protein, ur: NEGATIVE
Specific Gravity, Urine: 1.015
Urobilinogen, UA: 0.2
pH: 6.5

## 2011-03-05 LAB — HEPATIC FUNCTION PANEL
AST: 14
Albumin: 2.7 — ABNORMAL LOW
Total Bilirubin: 0.6

## 2011-03-05 LAB — COMPREHENSIVE METABOLIC PANEL
Alkaline Phosphatase: 149 — ABNORMAL HIGH
BUN: 3 — ABNORMAL LOW
CO2: 21
Chloride: 106
Creatinine, Ser: 0.43
GFR calc non Af Amer: >60
Total Bilirubin: 0.6

## 2011-03-05 LAB — URINE MICROSCOPIC-ADD ON: RBC / HPF: NONE SEEN

## 2011-03-05 LAB — RH IMMUNE GLOB WKUP(>/=20WKS)(NOT WOMEN'S HOSP): Fetal Screen: NEGATIVE

## 2011-03-05 LAB — DIFFERENTIAL
Basophils Absolute: 0
Basophils Relative: 0
Eosinophils Absolute: 0.1
Monocytes Relative: 8
Neutro Abs: 8.3 — ABNORMAL HIGH
Neutrophils Relative %: 71

## 2011-03-05 LAB — LACTATE DEHYDROGENASE: LDH: 117

## 2011-03-05 LAB — CBC
HCT: 37.4
Hemoglobin: 12.8
MCHC: 34.8
MCV: 88.6
MCV: 86.8
Platelets: 338
RBC: 4.22
RDW: 13.5
WBC: 11.7 — ABNORMAL HIGH

## 2011-03-05 LAB — ABO/RH: ABO/RH(D): A NEG

## 2011-03-05 LAB — RPR: RPR Ser Ql: NONREACTIVE

## 2011-03-05 LAB — URIC ACID: Uric Acid, Serum: 3.5

## 2011-03-05 LAB — CREATININE CLEARANCE, URINE, 24 HOUR
Creatinine, 24H Ur: 1207
Creatinine, Urine: 40.9
Creatinine: 0.43
Urine Total Volume-CRCL: 2950

## 2011-03-05 LAB — PROTEIN, URINE, 24 HOUR: Collection Interval-UPROT: 24

## 2012-02-19 ENCOUNTER — Encounter (HOSPITAL_COMMUNITY): Payer: Self-pay | Admitting: Emergency Medicine

## 2012-02-19 ENCOUNTER — Emergency Department (HOSPITAL_COMMUNITY)
Admission: EM | Admit: 2012-02-19 | Discharge: 2012-02-19 | Disposition: A | Discharge status: Home / Self Care | Payer: BC Managed Care – PPO | Attending: Emergency Medicine | Admitting: Emergency Medicine

## 2012-02-19 ENCOUNTER — Emergency Department (HOSPITAL_COMMUNITY): Payer: BC Managed Care – PPO

## 2012-02-19 DIAGNOSIS — I10 Essential (primary) hypertension: Secondary | ICD-10-CM | POA: Insufficient documentation

## 2012-02-19 DIAGNOSIS — N390 Urinary tract infection, site not specified: Secondary | ICD-10-CM | POA: Insufficient documentation

## 2012-02-19 DIAGNOSIS — J069 Acute upper respiratory infection, unspecified: Secondary | ICD-10-CM | POA: Insufficient documentation

## 2012-02-19 DIAGNOSIS — F329 Major depressive disorder, single episode, unspecified: Secondary | ICD-10-CM | POA: Insufficient documentation

## 2012-02-19 DIAGNOSIS — F411 Generalized anxiety disorder: Secondary | ICD-10-CM | POA: Insufficient documentation

## 2012-02-19 DIAGNOSIS — F3289 Other specified depressive episodes: Secondary | ICD-10-CM | POA: Insufficient documentation

## 2012-02-19 DIAGNOSIS — R509 Fever, unspecified: Secondary | ICD-10-CM | POA: Insufficient documentation

## 2012-02-19 DIAGNOSIS — F17200 Nicotine dependence, unspecified, uncomplicated: Secondary | ICD-10-CM | POA: Insufficient documentation

## 2012-02-19 LAB — URINE MICROSCOPIC-ADD ON

## 2012-02-19 LAB — URINALYSIS, ROUTINE W REFLEX MICROSCOPIC
Glucose, UA: NEGATIVE mg/dL
Nitrite: NEGATIVE
Specific Gravity, Urine: 1.015 (ref 1.005–1.030)
pH: 7 (ref 5.0–8.0)

## 2012-02-19 MED ORDER — IBUPROFEN 400 MG PO TABS
400.0000 mg | ORAL_TABLET | Freq: Once | ORAL | Status: AC
  Administered 2012-02-19: 400 mg via ORAL
  Filled 2012-02-19: qty 1

## 2012-02-19 MED ORDER — ACETAMINOPHEN 500 MG PO TABS
1000.0000 mg | ORAL_TABLET | Freq: Once | ORAL | Status: AC
  Administered 2012-02-19: 1000 mg via ORAL
  Filled 2012-02-19: qty 2

## 2012-02-19 MED ORDER — CEPHALEXIN 500 MG PO CAPS: 500.0000 mg | ORAL_CAPSULE | Freq: Four times a day (QID) | ORAL | Status: AC

## 2012-02-19 MED ORDER — CEPHALEXIN 500 MG PO CAPS
500.0000 mg | ORAL_CAPSULE | Freq: Once | ORAL | Status: AC
  Administered 2012-02-19: 500 mg via ORAL
  Filled 2012-02-19: qty 1

## 2012-02-19 NOTE — ED Notes (Signed)
Pt discharged. Pt stable at time of discharge. Medications reviewed pt has no questions regarding discharge at this time. Pt voiced understanding of discharge instructions.  

## 2012-02-19 NOTE — ED Provider Notes (Signed)
History     CSN: [id number]  Arrival date & time 02/19/12  1807   First MD Initiated Contact with Patient 02/19/12 1857      Chief Complaint  Patient presents with  . Back Pain  . Abdominal Pain  . Generalized Body Aches     HPI Pt was seen at 1935.  Per pt, c/o gradual onset and persistence of constant generalized body aches/fatigue, ears and sinus congestion, sore throat, cough, and home fevers since this morning.  Denies rash, no CP/SOB, no flank pain, no abd pain, no N/V/D.     Past Medical History  Diagnosis Date  . Hypertension   . Headache   . Depression   . Anxiety     History reviewed. No pertinent past surgical history.   History  Substance Use Topics  . Smoking status: Current Everyday Smoker -- 1.0 packs/day    Types: Cigarettes  . Smokeless tobacco: Not on file  . Alcohol Use: Yes     occ    Review of Systems ROS: Statement: All systems negative except as marked or noted in the HPI; Constitutional: +generalized body aches/fatigue. +fever and chills. ; ; Eyes: Negative for eye pain, redness and discharge. ; ; ENMT: +ear pain, nasal congestion, sinus pressure and sore throat. ; ; Cardiovascular: Negative for chest pain, palpitations, diaphoresis, dyspnea and peripheral edema. ; ; Respiratory: +cough. Negative for wheezing and stridor. ; ; Gastrointestinal: Negative for nausea, vomiting, diarrhea, abdominal pain, blood in stool, hematemesis, jaundice and rectal bleeding. . ; ; Genitourinary: Negative for dysuria, flank pain and hematuria. ; ; Musculoskeletal: Negative for back pain and neck pain. Negative for swelling and trauma.; ; Skin: Negative for pruritus, rash, abrasions, blisters, bruising and skin lesion.; ; Neuro: Negative for headache, lightheadedness and neck stiffness. Negative for weakness, altered level of consciousness , altered mental status, extremity weakness, paresthesias, involuntary movement, seizure and syncope.       Allergies  Zolpidem  tartrate and Latex  Home Medications   Current Outpatient Rx  Name Route Sig Dispense Refill  . ESCITALOPRAM OXALATE 10 MG PO TABS Oral Take 10 mg by mouth daily.    . IBUPROFEN 200 MG PO TABS Oral Take 800 mg by mouth every 8 (eight) hours as needed. For pain      BP 124/75  Pulse 98  Temp 103.2 F (39.6 C) (Oral)  Resp 20  Ht 5\' 3"  (1.6 m)  Wt 130 lb (58.968 kg)  BMI 23.03 kg/m2  SpO2 98%  LMP 02/13/2012  Physical Exam 1940: Physical examination:  Nursing notes reviewed; Vital signs and O2 SAT reviewed; +febrile. Constitutional: Well developed, Well nourished, Well hydrated, In no acute distress; Head:  Normocephalic, atraumatic; Eyes: EOMI, PERRL, No scleral icterus; ENMT: TM's clear bilat. +edemetous nasal turbinates bilat with clear rhinorrhea. Mouth and pharynx normal, Mucous membranes moist; Neck: Supple, Full range of motion, No lymphadenopathy, no meningeal signs; Cardiovascular: Tachycardic rate and rhythm, No gallop; Respiratory: Breath sounds clear & equal bilaterally, No wheezes.  Speaking full sentences with ease, Normal respiratory effort/excursion; Chest: Nontender, Movement normal; Abdomen: Soft, Nontender, Nondistended, Normal bowel sounds;; Extremities: Pulses normal, No tenderness, No edema, No calf edema or asymmetry.; Neuro: AA&Ox3, Major CN grossly intact.  Speech clear. No gross focal motor or sensory deficits in extremities.; Skin: Color normal, Warm, Dry, no rash.   ED Course  Procedures    MDM  MDM Reviewed: nursing note and vitals Interpretation: labs and x-ray     Results  for orders placed during the hospital encounter of 02/19/12  URINALYSIS, ROUTINE W REFLEX MICROSCOPIC      Component Value Range   Color, Urine YELLOW  YELLOW   APPearance CLOUDY (*) CLEAR   Specific Gravity, Urine 1.015  1.005 - 1.030   pH 7.0  5.0 - 8.0   Glucose, UA NEGATIVE  NEGATIVE mg/dL   Hgb urine dipstick LARGE (*) NEGATIVE   Bilirubin Urine NEGATIVE  NEGATIVE    Ketones, ur >80 (*) NEGATIVE mg/dL   Protein, ur NEGATIVE  NEGATIVE mg/dL   Urobilinogen, UA 0.2  0.0 - 1.0 mg/dL   Nitrite NEGATIVE  NEGATIVE   Leukocytes, UA LARGE (*) NEGATIVE  PREGNANCY, URINE      Component Value Range   Preg Test, Ur NEGATIVE  NEGATIVE  URINE MICROSCOPIC-ADD ON      Component Value Range   Squamous Epithelial / LPF FEW (*) RARE   WBC, UA TOO NUMEROUS TO COUNT  <3 WBC/hpf   RBC / HPF 11-20  <3 RBC/hpf   Bacteria, UA MANY (*) RARE  RAPID STREP SCREEN      Component Value Range   Streptococcus, Group A Screen (Direct) NEGATIVE  NEGATIVE   Dg Chest 2 View 02/19/2012  *RADIOLOGY REPORT*  Clinical Data: 31 year old female cough shortness of breath fever and fatigue.  CHEST - 2 VIEW  Comparison: 02/04/2011.  Findings: Semi upright AP and lateral views of the chest.  Lower lung volumes.  Streaky opacity at the left base most resembles atelectasis.  No pleural effusion or consolidation.  Upper lobes are clear.  Cardiac size and mediastinal contours are within normal limits.  Visualized tracheal air column is within normal limits. No acute osseous abnormality identified.  IMPRESSION: Lower lung volumes with streaky opacity at the left lung base favored to reflect atelectasis.  Follow-up films recommended if symptoms persist.   Original Report Authenticated By: Harley Hallmark, M.D.      2140:  Feels better after tylenol/motrin.  Wants to go home now.  Dx testing d/w pt and family.  Questions answered.  Verb understanding, agreeable to d/c home with outpt f/u.       Laray Anger, DO 02/21/12 1501

## 2012-02-19 NOTE — ED Notes (Signed)
Pt c/o back/abd and body aches that started this am.

## 2012-10-03 ENCOUNTER — Telehealth: Payer: Self-pay | Admitting: Obstetrics and Gynecology

## 2012-10-03 ENCOUNTER — Ambulatory Visit: Payer: Self-pay | Admitting: Adult Health

## 2012-10-03 NOTE — Telephone Encounter (Signed)
C/o "alot of greenish vaginal discharge, started period this morning and "very heavy with cramps." Transferred call to front staff to make appt to see provider.

## 2012-10-04 ENCOUNTER — Ambulatory Visit: Payer: Self-pay | Admitting: Obstetrics & Gynecology

## 2012-10-11 ENCOUNTER — Encounter: Payer: Self-pay | Admitting: *Deleted

## 2012-10-11 DIAGNOSIS — F329 Major depressive disorder, single episode, unspecified: Secondary | ICD-10-CM

## 2012-10-11 DIAGNOSIS — F419 Anxiety disorder, unspecified: Secondary | ICD-10-CM

## 2012-10-12 ENCOUNTER — Ambulatory Visit: Payer: Self-pay | Admitting: Obstetrics & Gynecology

## 2013-05-21 ENCOUNTER — Other Ambulatory Visit: Payer: Self-pay | Admitting: Adult Health

## 2014-04-15 ENCOUNTER — Encounter: Payer: Self-pay | Admitting: *Deleted

## 2014-06-01 ENCOUNTER — Emergency Department (HOSPITAL_COMMUNITY)
Admission: EM | Admit: 2014-06-01 | Discharge: 2014-06-01 | Disposition: A | Discharge status: Home / Self Care | Payer: BC Managed Care – PPO | Attending: Emergency Medicine | Admitting: Emergency Medicine

## 2014-06-01 ENCOUNTER — Emergency Department (HOSPITAL_COMMUNITY): Payer: BC Managed Care – PPO

## 2014-06-01 ENCOUNTER — Encounter (HOSPITAL_COMMUNITY): Payer: Self-pay | Admitting: Emergency Medicine

## 2014-06-01 DIAGNOSIS — Z79899 Other long term (current) drug therapy: Secondary | ICD-10-CM | POA: Insufficient documentation

## 2014-06-01 DIAGNOSIS — I10 Essential (primary) hypertension: Secondary | ICD-10-CM | POA: Diagnosis not present

## 2014-06-01 DIAGNOSIS — Z91040 Latex allergy status: Secondary | ICD-10-CM | POA: Insufficient documentation

## 2014-06-01 DIAGNOSIS — Z720 Tobacco use: Secondary | ICD-10-CM | POA: Diagnosis not present

## 2014-06-01 DIAGNOSIS — B349 Viral infection, unspecified: Secondary | ICD-10-CM

## 2014-06-01 DIAGNOSIS — F419 Anxiety disorder, unspecified: Secondary | ICD-10-CM | POA: Insufficient documentation

## 2014-06-01 DIAGNOSIS — H109 Unspecified conjunctivitis: Secondary | ICD-10-CM | POA: Diagnosis not present

## 2014-06-01 DIAGNOSIS — Z3202 Encounter for pregnancy test, result negative: Secondary | ICD-10-CM | POA: Diagnosis not present

## 2014-06-01 DIAGNOSIS — F329 Major depressive disorder, single episode, unspecified: Secondary | ICD-10-CM | POA: Insufficient documentation

## 2014-06-01 DIAGNOSIS — J029 Acute pharyngitis, unspecified: Secondary | ICD-10-CM | POA: Diagnosis present

## 2014-06-01 LAB — URINALYSIS, ROUTINE W REFLEX MICROSCOPIC
Bilirubin Urine: NEGATIVE
Glucose, UA: NEGATIVE mg/dL
Ketones, ur: NEGATIVE mg/dL
NITRITE: NEGATIVE
PH: 6.5 (ref 5.0–8.0)
Protein, ur: NEGATIVE mg/dL
SPECIFIC GRAVITY, URINE: 1.015 (ref 1.005–1.030)
UROBILINOGEN UA: 0.2 mg/dL (ref 0.0–1.0)

## 2014-06-01 LAB — I-STAT CHEM 8, ED
BUN: 7 mg/dL (ref 6–23)
CALCIUM ION: 1.11 mmol/L — AB (ref 1.12–1.23)
CREATININE: 0.8 mg/dL (ref 0.50–1.10)
Chloride: 105 mEq/L (ref 96–112)
Glucose, Bld: 91 mg/dL (ref 70–99)
HCT: 46 % (ref 36.0–46.0)
HEMOGLOBIN: 15.6 g/dL — AB (ref 12.0–15.0)
Potassium: 4 mEq/L (ref 3.7–5.3)
SODIUM: 139 meq/L (ref 137–147)
TCO2: 21 mmol/L (ref 0–100)

## 2014-06-01 LAB — URINE MICROSCOPIC-ADD ON

## 2014-06-01 LAB — PREGNANCY, URINE: Preg Test, Ur: NEGATIVE

## 2014-06-01 LAB — I-STAT TROPONIN, ED: TROPONIN I, POC: 0 ng/mL (ref 0.00–0.08)

## 2014-06-01 LAB — RAPID STREP SCREEN (MED CTR MEBANE ONLY): STREPTOCOCCUS, GROUP A SCREEN (DIRECT): NEGATIVE

## 2014-06-01 MED ORDER — ACETAMINOPHEN 500 MG PO TABS
1000.0000 mg | ORAL_TABLET | Freq: Once | ORAL | Status: AC
  Administered 2014-06-01: 1000 mg via ORAL
  Filled 2014-06-01: qty 2

## 2014-06-01 MED ORDER — IBUPROFEN 400 MG PO TABS
400.0000 mg | ORAL_TABLET | Freq: Once | ORAL | Status: AC
  Administered 2014-06-01: 400 mg via ORAL
  Filled 2014-06-01: qty 1

## 2014-06-01 MED ORDER — ERYTHROMYCIN 5 MG/GM OP OINT
TOPICAL_OINTMENT | Freq: Four times a day (QID) | OPHTHALMIC | Status: DC
  Administered 2014-06-01: 17:00:00 via OPHTHALMIC
  Filled 2014-06-01: qty 3.5

## 2014-06-01 MED ORDER — ONDANSETRON 8 MG PO TBDP
8.0000 mg | ORAL_TABLET | Freq: Once | ORAL | Status: AC
  Administered 2014-06-01: 8 mg via ORAL
  Filled 2014-06-01: qty 1

## 2014-06-01 MED ORDER — ONDANSETRON 4 MG PO TBDP: 4.0000 mg | ORAL_TABLET | Freq: Three times a day (TID) | ORAL | Status: DC | PRN

## 2014-06-01 MED ORDER — ONDANSETRON HCL 4 MG PO TABS: 4.0000 mg | ORAL_TABLET | Freq: Once | ORAL | Status: DC

## 2014-06-01 NOTE — Discharge Instructions (Signed)
°Emergency Department Resource Guide °1) Find a Doctor and Pay Out of Pocket °Although you won't have to find out who is covered by your insurance plan, it is a good idea to ask around and get recommendations. You will then need to call the office and see if the doctor you have chosen will accept you as a new patient and what types of options they offer for patients who are self-pay. Some doctors offer discounts or will set up payment plans for their patients who do not have insurance, but you will need to ask so you aren't surprised when you get to your appointment. ° °2) Contact Your Local Health Department °Not all health departments have doctors that can see patients for sick visits, but many do, so it is worth a call to see if yours does. If you don't know where your local health department is, you can check in your phone book. The CDC also has a tool to help you locate your state's health department, and many state websites also have listings of all of their local health departments. ° °3) Find a Walk-in Clinic °If your illness is not likely to be very severe or complicated, you may want to try a walk in clinic. These are popping up all over the country in pharmacies, drugstores, and shopping centers. They're usually staffed by nurse practitioners or physician assistants that have been trained to treat common illnesses and complaints. They're usually fairly quick and inexpensive. However, if you have serious medical issues or chronic medical problems, these are probably not your best option. ° °No Primary Care Doctor: °- Call [facility] Connect at  832-8000 - they can help you locate a primary care doctor that  accepts your insurance, provides certain services, etc. °- Physician Referral Service- 1-800-533-3463 ° °Chronic Pain Problems: °Organization         Address  Phone   Notes  °[facility]  (336) 297-2271 Patients need to be referred by their primary care doctor.  ° °Medication  Assistance: °Organization         Address  Phone   Notes  °[place] [facility] [address] °[place], [place] [address] (336) 641-8030 --Must be a resident of [place] °-- Must have NO insurance coverage whatsoever (no Medicaid/ Medicare, etc.) °-- The pt. MUST have a primary care doctor that directs their care regularly and follows them in the community °  °[facility]  (866) 331-1348   °[employer]  (888) 892-1162   ° °Agencies that provide inexpensive medical care: °Organization         Address  Phone   Notes  °[facility]  (336) 832-8035   °[facility]    (336) 832-7272   °[facility] [address] °[place], [place] [address] (336) 832-4777   °[facility] [address], °[place] (336) 271-4999   °[facility]    (336) 373-0678   °[facility]    (336) 272-1050   °[facility] ° [address], [place] Phone:  (336) 832-4444, Fax:  (336) 832-4440 Hours of Operation:  9 am - 6 pm, M-F.  Also accepts Medicaid/Medicare and self-pay.  °[facility] ° [address], [place] Phone: (336) [phone], Fax: (336) [phone]. Hours of Operation:  8:30 am - 5:30 pm, M-F.  Also accepts Medicaid and self-pay.  °[facility] [place] [address], [place] Phone: (336) 878-6027   °[facility] [address], [place], [place] (336)723-1848, Ext. 123 Mondays & Thursdays: 7-9 AM.  First 15 patients are seen on a first come, first serve basis. °  ° °Medicaid-accepting [place] Providers: ° °Organization         Address  Phone   Notes  °[facility] [address], [place] (336) 641-2100 Also accepts self-pay patients.  °[facility] [address], [place] ° (336) 856-9996   °[facility] [address], [place]  (336) 288-8857   °[facility] [address] [place] [address], [place] (336) 299-7000   °Veita Bland [address], [place]  ° (336) 373-1557 Only accepts [place] Access Medicaid patients after they have their name applied to their card.  ° °Self-Pay (no insurance) in [place]: ° °Organization         Address  Phone   Notes  °Sickle Cell Patients, [facility] [address], [place] (336) 832-1970   °[facility] [address], [place] (336) 832-4400   °[facility] ° [address], [place] (336) 992-4800   °[facility]/Dr. Osei-Bonsu ° [address] [place] [address], [place] or [address], [place] (336) [phone] Phone number for both [place] and [place] locations is the same.  °[facility] [address], [place] (336) 299-0000   °[facility] [address] [place] [address], [place] or [address] (336) 852-7530 °(336) 878-2260   °[facility] [address], [place] (336) 350-1642, phone; (336) 294-5005, fax Sees patients 1st and 3rd Saturday of every month.  Must not qualify for public or private insurance (i.e. Medicaid, Medicare, [place] Health Choice, Veterans' Benefits) • Household income should be no more than 200% of the poverty level •The clinic cannot treat you if you are pregnant or think you are pregnant • Sexually transmitted diseases are not treated at the clinic.  ° ° °Dental Care: °Organization         Address  Phone  Notes  °[place] [facility] [address], [place] (336) 641-6152 Accepts children up to age 21 who are enrolled in Medicaid or [place] Health Choice; pregnant women with a Medicaid card; and children who have applied for Medicaid or [place] Health Choice, but were declined, whose parents can pay a reduced fee at time of service.  °[place]  [facility] [place]  [address], [place] (336) 641-7733 Accepts children up to age 21 who are enrolled in Medicaid or [place] Health Choice; pregnant women with a Medicaid card; and children who have applied for Medicaid or [place] Health Choice, but were declined, whose parents can pay a reduced fee at time of service.  °[facility] ° [address], [place] (336) 641-4533 Patients are seen by appointment only. Walk-ins are not accepted. [facility] will see patients 18 years of age and older. °Monday - Tuesday (8am-5pm) °Most Wednesdays (8:30-5pm) °$30 per visit, cash only  °[facility] ° [address], [place] (336) 641-4533 Patients are seen by appointment only. Walk-ins are not accepted. [facility] will see patients 18 years of age and older. °One   Wednesday Evening (Monthly: Volunteer Based).  $30 per visit, cash only  °[facility]  (919) 537-3737 for adults; Children under age 4, call Graduate Pediatric Dentistry at ([phone]) [phone]. Children aged 4-14, please call ([phone]) [phone] to request a pediatric application. ° Dental services are provided in all areas of dental care including fillings, crowns and bridges, complete and partial dentures, implants, gum treatment, root canals, and extractions. Preventive care is also provided. Treatment is provided to both adults and children. °Patients are selected via a lottery and there is often a waiting list. °  °[facility] [address], °[place] ° (336) 763-8833 [email or web address] °  °[facility] [address], [place], [place] (336)723-1848, Ext. 123 Second and Fourth Thursday of each month, opens at 6:30 AM; Clinic ends at 9 AM.  Patients are seen on a first-come first-served basis, and a limited number are seen during each clinic.  ° °[facility] ° [address], [place], [place] (336) 723-7904    Eligibility Requirements °You must have lived in [place], [place], or [place] counties for at least the last three months. °  You cannot be eligible for state or federal sponsored healthcare insurance, including Veterans Administration, Medicaid, or Medicare. °  You generally cannot be eligible for healthcare insurance through your employer.  °  How to apply: °Eligibility screenings are held every Tuesday and Wednesday afternoon from 1:00 pm until 4:00 pm. You do not need an appointment for the interview!  °[facility] [address], [place], [place] 336-631-2330   °[facility]  336-342-8273   °[facility]  336-703-3100   °[facility]  336-570-6415   ° °Behavioral Health Resources in the Community: °Intensive Outpatient Programs °Organization         Address  Phone  Notes  °[facility] [address], [place], [place] 336-878-6098   °[facility] [address], [place], [place] 336-832-9800   °[facility] [address], [place], [place] ° 336-882-2125   °[facility] [address],  °[place], [place] 1-800-853-5163 or 336-641-4981   °Substance Abuse Resources °Organization         Address  Phone  Notes  °[facility]  336-882-2125   °[facility]  336-784-9470   °[facility]  336-285-9073   °[facility]  336-845-3988   °Residential & Outpatient Substance Abuse Program  1-800-659-3381   °Psychological Services °Organization         Address  Phone  Notes  °[facility]  336- 832-9600   °[facility]  336- 378-7881   °[facility] [address], [place] 1-800-853-5163 or 336-641-4981   ° °Mobile Crisis Teams °Organization         Address  Phone  Notes  °[facility], Mobile Crisis Care Unit  1-877-626-1772   °Assertive °[facility] ° [address]  [place], [place] 336-834-9664   °Sharon DeEsch [address] °[place] 336-554-5454   ° °Self-Help/Support Groups °Organization         Address  Phone             Notes  °[facility]  - variety of support groups  336- 373-1402 Call for more information  °Narcotics Anonymous (NA), [facility] [address], °[place]  2 meetings at this location  ° °  Residential Treatment Programs Organization         Address  Phone  Notes  [facility] [address],    [place]  [phone]   [facility]  [address], [place] [phone], [place], [place] [phone]   [facility] [address], [place] [phone] Admissions: 8am-3pm M-F  [facility] [address]    [place], [place] [phone]   [facility] [address], [place], [place] [phone]   [facility] [address]  [place], [place] [phone]   [facility] Intensive Outpatient [address], [place], [place] [phone]   [facility] [address]  [place], [place] [phone])[phone] or [phone]   [facility] ([facility] [address] [place], [place] [phone] Accepts Medicaid  [facility] [address]  [place] [phone] Substance Abuse/Addiction Treatment   [facility]         Address  Phone  Notes  [facility]  [phone](888) ([phone])[phone]   Angie FavaJulie Brannon, PhD [address], [address] [place], [place]   (970) [phone](336) [phone] or [phone](336) ([phone]   [facility]   [address] [place], [place] [phone](336) [phone]   [facility] [address], [place], [place] [phone](336) [phone] Insurance/Medicaid/sponsorship through [facility]  [facility] [address]                                    [place], [place] [phone](336) [phone] Therapy/tele-psych/case    [facility] [address]   [place], [place] [phone](336) [phone]    Dr. Lolly MustacheArfeen  [phone]) [phone]   [facility]  [employer] [facility] 1) [address],  2) [address], [place] 3)  [address], [place] [phone](336) [phone](336) [phone]  [phone](336) [phone]   [facility] [place] [place] Child Abuse Hot[facility] 323-135-1738(336) [phone] or (867)[phone](336) [phone] (After Hours)      Take over the counter tylenol and ibuprofen, as directed on packaging, as needed for discomfort.  Gargle with warm water several times per day to help with discomfort.  May also use over the counter sore throat pain medicines such as chloraseptic or sucrets, as directed on packaging, as needed for discomfort. Eat a bland diet as tolerated, and increase your fluids for the next several days. Call your regular medical doctor on Monday to schedule a follow up appointment this week.  Return to the Emergency Department immediately if worsening.

## 2014-06-01 NOTE — ED Notes (Signed)
Patient c/o generalized weakness that started this morning and has progressively gotten worse. Patient also reports yellow drainage and irritation to left eye, nasal congestion, and headache. Patient unsure of any fevers. Denies any cough. Per patient took Mucinex DM with no relief.

## 2014-06-01 NOTE — ED Notes (Signed)
Discharge instructions given, pt demonstrated teach back and verbal understanding. No concerns voiced.  

## 2014-06-01 NOTE — ED Provider Notes (Signed)
CSN: [id number]     Arrival date & time 06/01/14  1602 History   First MD Initiated Contact with Patient 06/01/14 1612     Chief Complaint  Patient presents with  . Fatigue  . Cough  . URI  . Generalized Body Aches      HPI Pt was seen at 1525.  Per pt, c/o gradual onset and persistence of constant sore throat, runny/stuffy nose, sinus congestion, generalized body aches/fatigue, "chills," and cough for the past several hours. Pt states her left eyelashes were "crusted shut" this morning with "yellow drainage." Pt also c/o constant mid-sternal chest "pain" for the past 1 week. Describes the CP as "aching," which improves "when I press on it." Pt has taken mucinex for her symptoms without relief. Denies eye pain, no eye injury, no visual changes. Denies objective fevers, no rash, no SOB, no palpitations, no N/V/D, no abd pain. Pt's husband states both children had similar symptoms this past week.     Past Medical History  Diagnosis Date  . Hypertension   . Headache(784.0)   . Depression   . Anxiety    History reviewed. No pertinent past surgical history.   Family History  Problem Relation Age of Onset  . Stroke Other   . Diabetes Other   . Cancer Other   . Mental illness Other   . Hypertension Other    History  Substance Use Topics  . Smoking status: Current Every Day Smoker -- 1.00 packs/day for 17 years    Types: Cigarettes  . Smokeless tobacco: Never Used  . Alcohol Use: Yes     Comment: occ   OB History    Gravida Para Term Preterm AB TAB SAB Ectopic Multiple Living   2 2 2       2      Review of Systems ROS: Statement: All systems negative except as marked or noted in the HPI; Constitutional: Negative for objective home fever. +chills, generalized body aches/fatigue. ; ; Eyes: Negative for eye pain, +left eye redness and discharge. ; ; ENMT: Negative for ear pain, hoarseness, +nasal congestion, sinus pressure and sore throat. ; ; Cardiovascular: Negative for  palpitations, diaphoresis, dyspnea and peripheral edema. ; ; Respiratory: +cough. Negative for wheezing and stridor. ; ; Gastrointestinal: Negative for nausea, vomiting, diarrhea, abdominal pain, blood in stool, hematemesis, jaundice and rectal bleeding. . ; ; Genitourinary: Negative for dysuria, flank pain and hematuria. ; ; Musculoskeletal: +chest wall pain. Negative for back pain and neck pain. Negative for swelling and trauma.; ; Skin: Negative for pruritus, rash, abrasions, blisters, bruising and skin lesion.; ; Neuro: Negative for headache, lightheadedness and neck stiffness. Negative for weakness, altered level of consciousness , altered mental status, extremity weakness, paresthesias, involuntary movement, seizure and syncope.      Allergies  Zolpidem tartrate and Latex  Home Medications   Prior to Admission medications   Medication Sig Start Date End Date Taking? Authorizing Provider  escitalopram (LEXAPRO) 20 MG tablet TAKE ONE (1) TABLET EACH DAY 05/21/13   Adline PotterJennifer A Griffin, NP  ibuprofen (ADVIL,MOTRIN) 200 MG tablet Take 800 mg by mouth every 8 (eight) hours as needed. For pain    Historical Provider, MD   BP 158/105 mmHg  Pulse 120  Temp(Src) 100.5 F (38.1 C) (Oral)  Resp 20  SpO2 100%  LMP 05/09/2014   . 16:32 Orthostatic Vital Signs JM  Orthostatic Lying  - BP- Lying: 136/85 mmHg ; Pulse- Lying: 96  Orthostatic Sitting - BP- Sitting:  137/97 mmHg ; Pulse- Sitting: 98  Orthostatic Standing at 0 minutes - BP- Standing at 0 minutes: 128/92 mmHg ; Pulse- Standing at 0 minutes: 107     Physical Exam  1630: Physical examination:  Nursing notes reviewed; Vital signs and O2 SAT reviewed;  Constitutional: Well developed, Well nourished, Well hydrated, Crying.; Head:  Normocephalic, atraumatic; Eyes: EOMI without pain. PERRL, No scleral icterus. +mild left conjunctival injection with yellow crusting on lower lashes. No obvious FB, hyphema or hypopyon. No periorbital rash. No  proptosis. No chemosis.; ENMT: TM's clear bilat. +edemetous nasal turbinates bilat with clear rhinorrhea. Mouth and pharynx without lesions. No tonsillar exudates. No intra-oral edema. No submandibular or sublingual edema. No hoarse voice, no drooling, no stridor. No pain with manipulation of larynx. No trismus. Mouth and pharynx normal, Mucous membranes moist; Neck: Supple, Full range of motion, No lymphadenopathy. No meningeal signs.; Cardiovascular: Tachycardic rate and rhythm, No gallop; Respiratory: Breath sounds clear & equal bilaterally, No rales, rhonchi, wheezes.  Speaking full sentences with ease, Normal respiratory effort/excursion; Chest: Nontender, Movement normal; Abdomen: Soft, Nontender, Nondistended, Normal bowel sounds; Genitourinary: No CVA tenderness; Extremities: Pulses normal, No tenderness, No edema, No calf edema or asymmetry.; Neuro: AA&Ox3, Major CN grossly intact.  Speech clear. No gross focal motor or sensory deficits in extremities.; Skin: Color normal, Warm, Dry.; Psych:  Anxious, crying.     ED Course  Procedures     EKG Interpretation   Date/Time:  Saturday June 01 2014 16:19:22 EST Ventricular Rate:  108 PR Interval:  146 QRS Duration: 64 QT Interval:  311 QTC Calculation: 417 R Axis:   41 Text Interpretation:  Sinus tachycardia When compared with ECG of 12/13/2010  Rate faster Otherwise no significant change Confirmed by [facility]  MD,  Nicholos JohnsKATHLEEN [phone]([id number]) on 06/01/2014 4:48:35 PM      MDM  MDM Reviewed: previous chart, nursing note and vitals Reviewed previous: labs and ECG Interpretation: labs, ECG and x-ray      Results for orders placed or performed during the hospital encounter of 06/01/14  Rapid strep screen  Result Value Ref Range   Streptococcus, Group A Screen (Direct) NEGATIVE NEGATIVE  Urinalysis, Routine w reflex microscopic  Result Value Ref Range   Color, Urine YELLOW YELLOW   APPearance CLEAR CLEAR   Specific Gravity, Urine 1.015  1.005 - 1.030   pH 6.5 5.0 - 8.0   Glucose, UA NEGATIVE NEGATIVE mg/dL   Hgb urine dipstick SMALL (A) NEGATIVE   Bilirubin Urine NEGATIVE NEGATIVE   Ketones, ur NEGATIVE NEGATIVE mg/dL   Protein, ur NEGATIVE NEGATIVE mg/dL   Urobilinogen, UA 0.2 0.0 - 1.0 mg/dL   Nitrite NEGATIVE NEGATIVE   Leukocytes, UA TRACE (A) NEGATIVE  Pregnancy, urine  Result Value Ref Range   Preg Test, Ur NEGATIVE NEGATIVE  Urine microscopic-add on  Result Value Ref Range   Squamous Epithelial / LPF FEW (A) RARE   WBC, UA 3-6 <3 WBC/hpf   RBC / HPF 3-6 <3 RBC/hpf   Bacteria, UA FEW (A) RARE  I-stat Chem 8, ED  Result Value Ref Range   Sodium 139 137 - 147 mEq/L   Potassium 4.0 3.7 - 5.3 mEq/L   Chloride 105 96 - 112 mEq/L   BUN 7 6 - 23 mg/dL   Creatinine, Ser 4.090.80 0.50 - 1.10 mg/dL   Glucose, Bld 91 70 - 99 mg/dL   Calcium, Ion 8.111.11 (L) 1.12 - 1.23 mmol/L   TCO2 21 0 - 100 mmol/L  Hemoglobin 15.6 (H) 12.0 - 15.0 g/dL   HCT 16.1 09.6 - 04.5 %  I-stat troponin, ED  Result Value Ref Range   Troponin i, poc 0.00 0.00 - 0.08 ng/mL   Comment 3           Dg Chest 2 View 06/01/2014   CLINICAL DATA:  Fatigue, midchest pain, nonradiating, congestion with fever. Denies shortness of breath or cough. Initial encounter.  EXAM: CHEST  2 VIEW  COMPARISON:  02/19/2012  FINDINGS: The heart size and mediastinal contours are within normal limits. Both lungs are clear. The visualized skeletal structures are unremarkable.  IMPRESSION: No active cardiopulmonary disease.   Electronically Signed   By: Elige Ko   On: 06/01/2014 18:03    1925:  Pt has tol PO well without N/V. No stooling while in the ED. Doubt PE as cause for symptoms with low risk [place].  Doubt ACS as cause for symptoms with normal troponin and otherwise unchanged EKG from previous after 1 week of constant symptoms. Workup reassuring. Tx conjunctivitis with erythromycin ointment. Tx viral illness symptomatically at this time. Pt states she wants to go  home now. Dx and testing d/w pt and family.  Questions answered.  Verb understanding, agreeable to d/c home with outpt f/u.    Samuel Jester, DO 06/03/14 2103

## 2014-06-03 LAB — CULTURE, GROUP A STREP

## 2014-06-15 ENCOUNTER — Other Ambulatory Visit: Payer: Self-pay | Admitting: Adult Health

## 2014-08-18 ENCOUNTER — Other Ambulatory Visit: Payer: Self-pay | Admitting: Adult Health

## 2014-09-26 ENCOUNTER — Emergency Department (HOSPITAL_COMMUNITY): Payer: BLUE CROSS/BLUE SHIELD

## 2014-09-26 ENCOUNTER — Encounter (HOSPITAL_COMMUNITY): Payer: Self-pay | Admitting: *Deleted

## 2014-09-26 ENCOUNTER — Emergency Department (HOSPITAL_COMMUNITY)
Admission: EM | Admit: 2014-09-26 | Discharge: 2014-09-26 | Disposition: A | Discharge status: Home / Self Care | Payer: BLUE CROSS/BLUE SHIELD | Attending: Emergency Medicine | Admitting: Emergency Medicine

## 2014-09-26 DIAGNOSIS — R059 Cough, unspecified: Secondary | ICD-10-CM

## 2014-09-26 DIAGNOSIS — R509 Fever, unspecified: Secondary | ICD-10-CM | POA: Diagnosis not present

## 2014-09-26 DIAGNOSIS — Z79899 Other long term (current) drug therapy: Secondary | ICD-10-CM | POA: Diagnosis not present

## 2014-09-26 DIAGNOSIS — I10 Essential (primary) hypertension: Secondary | ICD-10-CM | POA: Diagnosis not present

## 2014-09-26 DIAGNOSIS — R0981 Nasal congestion: Secondary | ICD-10-CM | POA: Diagnosis not present

## 2014-09-26 DIAGNOSIS — Z3202 Encounter for pregnancy test, result negative: Secondary | ICD-10-CM | POA: Insufficient documentation

## 2014-09-26 DIAGNOSIS — R05 Cough: Secondary | ICD-10-CM | POA: Insufficient documentation

## 2014-09-26 DIAGNOSIS — F419 Anxiety disorder, unspecified: Secondary | ICD-10-CM | POA: Diagnosis not present

## 2014-09-26 DIAGNOSIS — Z720 Tobacco use: Secondary | ICD-10-CM | POA: Insufficient documentation

## 2014-09-26 DIAGNOSIS — R062 Wheezing: Secondary | ICD-10-CM | POA: Diagnosis not present

## 2014-09-26 DIAGNOSIS — M791 Myalgia: Secondary | ICD-10-CM | POA: Insufficient documentation

## 2014-09-26 DIAGNOSIS — Z91040 Latex allergy status: Secondary | ICD-10-CM | POA: Diagnosis not present

## 2014-09-26 DIAGNOSIS — F329 Major depressive disorder, single episode, unspecified: Secondary | ICD-10-CM | POA: Insufficient documentation

## 2014-09-26 DIAGNOSIS — R0602 Shortness of breath: Secondary | ICD-10-CM | POA: Insufficient documentation

## 2014-09-26 LAB — POC URINE PREG, ED: PREG TEST UR: NEGATIVE

## 2014-09-26 MED ORDER — ALBUTEROL SULFATE HFA 108 (90 BASE) MCG/ACT IN AERS
2.0000 | INHALATION_SPRAY | Freq: Once | RESPIRATORY_TRACT | Status: AC
  Administered 2014-09-26: 2 via RESPIRATORY_TRACT
  Filled 2014-09-26: qty 6.7

## 2014-09-26 MED ORDER — AZITHROMYCIN 250 MG PO TABS: 250.0000 mg | ORAL_TABLET | Freq: Every day | ORAL | Status: DC

## 2014-09-26 MED ORDER — GUAIFENESIN-CODEINE 100-10 MG/5ML PO SYRP: 10.0000 mL | ORAL_SOLUTION | Freq: Three times a day (TID) | ORAL | Status: DC | PRN

## 2014-09-26 NOTE — Discharge Instructions (Signed)
Cough, Adult   A cough is a reflex. It helps you clear your throat and airways. A cough can help heal your body. A cough can last 2 or 3 weeks (acute) or may last more than 8 weeks (chronic). Some common causes of a cough can include an infection, allergy, or a cold.  HOME CARE  · Only take medicine as told by your doctor.  · If given, take your medicines (antibiotics) as told. Finish them even if you start to feel better.  · Use a cold steam vaporizer or humidifier in your home. This can help loosen thick spit (secretions).  · Sleep so you are almost sitting up (semi-upright). Use pillows to do this. This helps reduce coughing.  · Rest as needed.  · Stop smoking if you smoke.  GET HELP RIGHT AWAY IF:  · You have yellowish-white fluid (pus) in your thick spit.  · Your cough gets worse.  · Your medicine does not reduce coughing, and you are losing sleep.  · You cough up blood.  · You have trouble breathing.  · Your pain gets worse and medicine does not help.  · You have a fever.  MAKE SURE YOU:   · Understand these instructions.  · Will watch your condition.  · Will get help right away if you are not doing well or get worse.  Document Released: 02/11/2011 Document Revised: 10/15/2013 Document Reviewed: 02/11/2011  ExitCare® Patient Information ©2015 [employer]. This information is not intended to replace advice given to you by your health care provider. Make sure you discuss any questions you have with your health care provider.

## 2014-09-26 NOTE — ED Notes (Signed)
Cough,feels sob, NP cough. Fever.

## 2014-09-26 NOTE — ED Notes (Signed)
Patient transported to X-ray 

## 2014-09-26 NOTE — ED Notes (Signed)
RT with pt

## 2014-09-29 NOTE — ED Provider Notes (Signed)
CSN: [id number]     Arrival date & time 09/26/14  1353 History   First MD Initiated Contact with Patient 09/26/14 1441     Chief Complaint  Patient presents with  . Cough     (Consider location/radiation/quality/duration/timing/severity/associated sxs/prior Treatment) HPI   Marissa Reese is a 34 y.o. female who presents to the Emergency Department complaining of cough, fever and generalized body aches.  She reports sx's for several days.  She reports mild shortness of breath after excessive cough.  Cough has been mostly  Non-productive.  She also reports nasal congestion. She has been taking ibuprofen for symptoms.  She denies bloody sputum, chest pain, abdominal pain, vomiting or diarrhea.  Past Medical History  Diagnosis Date  . Hypertension   . Headache(784.0)   . Depression   . Anxiety    History reviewed. No pertinent past surgical history. Family History  Problem Relation Age of Onset  . Stroke Other   . Diabetes Other   . Cancer Other   . Mental illness Other   . Hypertension Other    History  Substance Use Topics  . Smoking status: Current Every Day Smoker -- 1.00 packs/day for 17 years    Types: Cigarettes  . Smokeless tobacco: Never Used  . Alcohol Use: Yes     Comment: occ   OB History    Gravida Para Term Preterm AB TAB SAB Ectopic Multiple Living   2 2 2       2      Review of Systems  Constitutional: Positive for fever. Negative for chills, activity change and appetite change.  HENT: Positive for congestion. Negative for facial swelling, rhinorrhea, sore throat and trouble swallowing.   Eyes: Negative for visual disturbance.  Respiratory: Positive for cough and shortness of breath. Negative for wheezing and stridor.   Cardiovascular: Negative for chest pain.  Gastrointestinal: Negative for nausea, vomiting and abdominal pain.  Musculoskeletal: Positive for myalgias. Negative for neck pain and neck stiffness.  Skin: Negative.   Neurological:  Negative for dizziness, weakness, numbness and headaches.  Hematological: Negative for adenopathy.  Psychiatric/Behavioral: Negative for confusion.  All other systems reviewed and are negative.     Allergies  Zolpidem tartrate and Latex  Home Medications   Prior to Admission medications   Medication Sig Start Date End Date Taking? Authorizing Provider  buPROPion (WELLBUTRIN SR) 150 MG 12 hr tablet Take 150 mg by mouth daily.   Yes Historical Provider, MD  Vitamin D, Ergocalciferol, (DRISDOL) 50000 UNITS CAPS capsule Take 50,000 Units by mouth every 7 (seven) days.   Yes Historical Provider, MD  azithromycin (ZITHROMAX) 250 MG tablet Take 1 tablet (250 mg total) by mouth daily. Take first 2 tablets together, then 1 every day until finished. 09/26/14   Khushboo Chuck, PA-C  escitalopram (LEXAPRO) 20 MG tablet TAKE ONE (1) TABLET EACH DAY Patient not taking: Reported on 09/26/2014 08/19/14   Adline PotterJennifer A Griffin, NP  guaiFENesin-codeine (ROBITUSSIN AC) 100-10 MG/5ML syrup Take 10 mLs by mouth 3 (three) times daily as needed. 09/26/14   Fulton Merry, PA-C  ibuprofen (ADVIL,MOTRIN) 200 MG tablet Take 800 mg by mouth every 8 (eight) hours as needed. For pain    Historical Provider, MD  ondansetron (ZOFRAN ODT) 4 MG disintegrating tablet Take 1 tablet (4 mg total) by mouth every 8 (eight) hours as needed for nausea or vomiting. Patient not taking: Reported on 09/26/2014 06/01/14   Samuel JesterKathleen McManus, DO   BP 133/77 mmHg  Pulse 94  Temp(Src) 98.6 F (37 C) (Oral)  Resp 18  Ht 5' (1.524 m)  Wt 150 lb (68.04 kg)  BMI 29.30 kg/m2  SpO2 100%  LMP 09/12/2014 Physical Exam  Constitutional: She is oriented to person, place, and time. She appears well-developed and well-nourished. No distress.  HENT:  Head: Normocephalic and atraumatic.  Right Ear: Tympanic membrane and ear canal normal.  Left Ear: Tympanic membrane and ear canal normal.  Mouth/Throat: Uvula is midline, oropharynx is clear and moist  and mucous membranes are normal. No oropharyngeal exudate.  Eyes: EOM are normal. Pupils are equal, round, and reactive to light.  Neck: Normal range of motion, full passive range of motion without pain and phonation normal. Neck supple.  Cardiovascular: Normal rate, regular rhythm, normal heart sounds and intact distal pulses.   No murmur heard. Pulmonary/Chest: Effort normal. No stridor. No respiratory distress. She has wheezes. She has no rales. She exhibits no tenderness.  Coarse lungs sounds bilaterally.  Few inspiratory wheezes  Musculoskeletal: She exhibits no edema.  Lymphadenopathy:    She has no cervical adenopathy.  Neurological: She is alert and oriented to person, place, and time. She exhibits normal muscle tone. Coordination normal.  Skin: Skin is warm and dry.  Nursing note and vitals reviewed.   ED Course  Procedures (including critical care time) Labs Review Labs Reviewed  POC URINE PREG, ED  POC URINE PREG, ED    Imaging Review Dg Chest 2 View  09/26/2014   CLINICAL DATA:  Cough, shortness of breath for 3 days.  EXAM: CHEST  2 VIEW  COMPARISON:  June 01, 2014.  FINDINGS: The heart size and mediastinal contours are within normal limits. Both lungs are clear. No pneumothorax or pleural effusion is noted. The visualized skeletal structures are unremarkable.  IMPRESSION: No active cardiopulmonary disease.   Electronically Signed   By: Lupita Raider, M.D.   On: 09/26/2014 15:01      EKG Interpretation None      MDM   Final diagnoses:  Cough    Pt is well appearing.  No hypoxia.  Previous tachycardia improved.   Lung sounds improved after albuterol.  Pt appears stable for d/c and agrees to arrange PMD f/u or return here for any worsening sx's  Appears stable for d/c  Pauline Aus, PA-C 09/29/14 2017  Bethann Berkshire, MD 09/30/14 [phone]

## 2015-03-17 ENCOUNTER — Encounter (HOSPITAL_COMMUNITY): Payer: Self-pay | Admitting: Emergency Medicine

## 2015-03-17 ENCOUNTER — Emergency Department (HOSPITAL_COMMUNITY): Payer: BLUE CROSS/BLUE SHIELD

## 2015-03-17 ENCOUNTER — Emergency Department (HOSPITAL_COMMUNITY)
Admission: EM | Admit: 2015-03-17 | Discharge: 2015-03-17 | Disposition: A | Discharge status: Home / Self Care | Payer: BLUE CROSS/BLUE SHIELD | Attending: Emergency Medicine | Admitting: Emergency Medicine

## 2015-03-17 DIAGNOSIS — R61 Generalized hyperhidrosis: Secondary | ICD-10-CM | POA: Insufficient documentation

## 2015-03-17 DIAGNOSIS — R079 Chest pain, unspecified: Secondary | ICD-10-CM | POA: Insufficient documentation

## 2015-03-17 DIAGNOSIS — F329 Major depressive disorder, single episode, unspecified: Secondary | ICD-10-CM | POA: Insufficient documentation

## 2015-03-17 DIAGNOSIS — Z91040 Latex allergy status: Secondary | ICD-10-CM | POA: Insufficient documentation

## 2015-03-17 DIAGNOSIS — R1013 Epigastric pain: Secondary | ICD-10-CM | POA: Diagnosis not present

## 2015-03-17 DIAGNOSIS — Z720 Tobacco use: Secondary | ICD-10-CM | POA: Insufficient documentation

## 2015-03-17 DIAGNOSIS — Z3202 Encounter for pregnancy test, result negative: Secondary | ICD-10-CM | POA: Diagnosis not present

## 2015-03-17 DIAGNOSIS — Z79899 Other long term (current) drug therapy: Secondary | ICD-10-CM | POA: Diagnosis not present

## 2015-03-17 DIAGNOSIS — I10 Essential (primary) hypertension: Secondary | ICD-10-CM | POA: Insufficient documentation

## 2015-03-17 DIAGNOSIS — R0602 Shortness of breath: Secondary | ICD-10-CM | POA: Diagnosis not present

## 2015-03-17 DIAGNOSIS — F419 Anxiety disorder, unspecified: Secondary | ICD-10-CM | POA: Diagnosis not present

## 2015-03-17 LAB — TROPONIN I

## 2015-03-17 LAB — PREGNANCY, URINE: PREG TEST UR: NEGATIVE

## 2015-03-17 LAB — COMPREHENSIVE METABOLIC PANEL
ALT: 17 U/L (ref 14–54)
AST: 23 U/L (ref 15–41)
Albumin: 3.8 g/dL (ref 3.5–5.0)
Alkaline Phosphatase: 83 U/L (ref 38–126)
Anion gap: 8 (ref 5–15)
BILIRUBIN TOTAL: 0.1 mg/dL — AB (ref 0.3–1.2)
BUN: 5 mg/dL — ABNORMAL LOW (ref 6–20)
CALCIUM: 8.6 mg/dL — AB (ref 8.9–10.3)
CHLORIDE: 106 mmol/L (ref 101–111)
CO2: 24 mmol/L (ref 22–32)
CREATININE: 1.21 mg/dL — AB (ref 0.44–1.00)
GFR, EST NON AFRICAN AMERICAN: 58 mL/min — AB (ref >60)
Glucose, Bld: 122 mg/dL — ABNORMAL HIGH (ref 65–99)
Potassium: 3.4 mmol/L — ABNORMAL LOW (ref 3.5–5.1)
Sodium: 138 mmol/L (ref 135–145)
TOTAL PROTEIN: 7 g/dL (ref 6.5–8.1)

## 2015-03-17 LAB — URINALYSIS, ROUTINE W REFLEX MICROSCOPIC
BILIRUBIN URINE: NEGATIVE
Glucose, UA: NEGATIVE mg/dL
KETONES UR: NEGATIVE mg/dL
Leukocytes, UA: NEGATIVE
NITRITE: NEGATIVE
Protein, ur: NEGATIVE mg/dL
Specific Gravity, Urine: 1.006 (ref 1.005–1.030)
UROBILINOGEN UA: 0.2 mg/dL (ref 0.0–1.0)
pH: 6.5 (ref 5.0–8.0)

## 2015-03-17 LAB — CBC WITH DIFFERENTIAL/PLATELET
BASOS ABS: 0 10*3/uL (ref 0.0–0.1)
BASOS PCT: 0 %
EOS ABS: 0 10*3/uL (ref 0.0–0.7)
EOS PCT: 1 %
HCT: 40.2 % (ref 36.0–46.0)
Hemoglobin: 12.7 g/dL (ref 12.0–15.0)
LYMPHS PCT: 24 %
Lymphs Abs: 1.8 10*3/uL (ref 0.7–4.0)
MCH: 27 pg (ref 26.0–34.0)
MCHC: 31.6 g/dL (ref 30.0–36.0)
MCV: 85.4 fL (ref 78.0–100.0)
MONO ABS: 0.5 10*3/uL (ref 0.1–1.0)
Monocytes Relative: 6 %
Neutro Abs: 5.2 10*3/uL (ref 1.7–7.7)
Neutrophils Relative %: 69 %
PLATELETS: 383 10*3/uL (ref 150–400)
RBC: 4.71 MIL/uL (ref 3.87–5.11)
RDW: 15.6 % — AB (ref 11.5–15.5)
WBC: 7.5 10*3/uL (ref 4.0–10.5)

## 2015-03-17 LAB — URINE MICROSCOPIC-ADD ON

## 2015-03-17 LAB — D-DIMER, QUANTITATIVE (NOT AT ARMC)

## 2015-03-17 LAB — I-STAT TROPONIN, ED: TROPONIN I, POC: 0 ng/mL (ref 0.00–0.08)

## 2015-03-17 LAB — LIPASE, BLOOD: LIPASE: 132 U/L — AB (ref 22–51)

## 2015-03-17 MED ORDER — IOHEXOL 300 MG/ML  SOLN
100.0000 mL | Freq: Once | INTRAMUSCULAR | Status: AC | PRN
  Administered 2015-03-17: 100 mL via INTRAVENOUS

## 2015-03-17 MED ORDER — ONDANSETRON 4 MG PO TBDP: ORAL_TABLET | ORAL | Status: DC

## 2015-03-17 NOTE — ED Notes (Signed)
Pt arrives via [facility] for c/o mid sternal chest pressure with diaphoresis and sob. Pt reports she had some intermittent pains last night. Pt received  asa and 1 SL nitro via ems prior to arrival. Pt denies pain at this time. Pt is alert and oriented, nad. Pt with hx of anxiety that is usually controlled by medication.

## 2015-03-17 NOTE — ED Provider Notes (Signed)
CSN: [id number]     Arrival date & time 03/17/15  1458 History   First MD Initiated Contact with Patient 03/17/15 1506     Chief Complaint  Patient presents with  . Chest Pain     (Consider location/radiation/quality/duration/timing/severity/associated sxs/prior Treatment) HPI Patient reports that she had an episode of pain yesterday evening that was more mild. She was having pains up through the center of her chest. Today at work it got much more intense with tight pain and shortness of breath with sweatiness. She reports that it worsened after the nurse at work suggested she take the ambulance to the hospital. She thought might be due to anxiety but she denies any history of anxiety. She denies she's had swelling or pain in her legs. She does not have history of pulmonary embolus. Past Medical History  Diagnosis Date  . Hypertension   . Headache(784.0)   . Depression   . Anxiety    History reviewed. No pertinent past surgical history. Family History  Problem Relation Age of Onset  . Stroke Other   . Diabetes Other   . Cancer Other   . Mental illness Other   . Hypertension Other    Social History  Substance Use Topics  . Smoking status: Current Every Day Smoker -- 1.00 packs/day for 17 years    Types: Cigarettes  . Smokeless tobacco: Never Used  . Alcohol Use: Yes     Comment: occ   OB History    Gravida Para Term Preterm AB TAB SAB Ectopic Multiple Living   Review of Systems  10 Systems reviewed and are negative for acute change except as noted in the HPI.   Allergies  Zolpidem tartrate and Latex  Home Medications   Prior to Admission medications   Medication Sig Start Date End Date Taking? Authorizing Provider  buPROPion (WELLBUTRIN XL) 300 MG 24 hr tablet Take 300 mg by mouth daily.   Yes Historical Provider, MD  ibuprofen (ADVIL,MOTRIN) 200 MG tablet Take 800 mg by mouth every 8 (eight) hours as needed. For pain   Yes Historical Provider,  MD  azithromycin (ZITHROMAX) 250 MG tablet Take 1 tablet (250 mg total) by mouth daily. Take first 2 tablets together, then 1 every day until finished. Patient not taking: Reported on 03/17/2015 09/26/14   Tammy Triplett, PA-C  escitalopram (LEXAPRO) 20 MG tablet TAKE ONE (1) TABLET EACH DAY Patient not taking: Reported on 09/26/2014 08/19/14   Adline Potter, NP  guaiFENesin-codeine (ROBITUSSIN AC) 100-10 MG/5ML syrup Take 10 mLs by mouth 3 (three) times daily as needed. Patient not taking: Reported on 03/17/2015 09/26/14   Tammy Triplett, PA-C  ondansetron (ZOFRAN ODT) 4 MG disintegrating tablet Take 1 tablet (4 mg total) by mouth every 8 (eight) hours as needed for nausea or vomiting. Patient not taking: Reported on 09/26/2014 06/01/14   Samuel Jester, DO  ondansetron (ZOFRAN ODT) 4 MG disintegrating tablet 1 tablet sublingual q6h PRN nausea/vomiting 03/17/15   Elwin Mocha, MD   BP 118/66 mmHg  Pulse 85  Temp(Src) 98.5 F (36.9 C) (Oral)  Resp 16  SpO2 98%  LMP 03/17/2015 (Exact Date) Physical Exam  Constitutional: She is oriented to person, place, and time. She appears well-developed and well-nourished.  HENT:  Head: Normocephalic and atraumatic.  Eyes: EOM are normal. Pupils are equal, round, and reactive to light.  Neck: Neck supple.  Cardiovascular: Normal rate, regular rhythm,  normal heart sounds and intact distal pulses.   Pulmonary/Chest: Effort normal and breath sounds normal.  Abdominal: Soft. Bowel sounds are normal. She exhibits no distension. There is tenderness.  Moderate epigastric discomfort palpation  Musculoskeletal: Normal range of motion. She exhibits no edema or tenderness.  Neurological: She is alert and oriented to person, place, and time. She has normal strength. No cranial nerve deficit. She exhibits normal muscle tone. Coordination normal. GCS eye subscore is 4. GCS verbal subscore is 5. GCS motor subscore is 6.  Skin: Skin is warm, dry and intact.   Psychiatric: She has a normal mood and affect.    ED Course  Procedures (including critical care time) Labs Review Labs Reviewed  COMPREHENSIVE METABOLIC PANEL - Abnormal; Notable for the following:    Potassium 3.4 (*)    Glucose, Bld 122 (*)    BUN 5 (*)    Creatinine, Ser 1.21 (*)    Calcium 8.6 (*)    Total Bilirubin 0.1 (*)    GFR calc non Af Amer 58 (*)    All other components within normal limits  LIPASE, BLOOD - Abnormal; Notable for the following:    Lipase 132 (*)    All other components within normal limits  CBC WITH DIFFERENTIAL/PLATELET - Abnormal; Notable for the following:    RDW 15.6 (*)    All other components within normal limits  URINALYSIS, ROUTINE W REFLEX MICROSCOPIC (NOT AT [facility]) - Abnormal; Notable for the following:    Hgb urine dipstick MODERATE (*)    All other components within normal limits  D-DIMER, QUANTITATIVE (NOT AT [facility])  PREGNANCY, URINE  URINE MICROSCOPIC-ADD ON  TROPONIN I  I-STAT TROPOININ, ED    Imaging Review No results found. I have personally reviewed and evaluated these images and lab results as part of my medical decision-making.   EKG Interpretation   Date/Time:  Monday March 17 2015 15:11:18 EDT Ventricular Rate:  102 PR Interval:  144 QRS Duration: 80 QT Interval:  332 QTC Calculation: 432 R Axis:   41 Text Interpretation:  Sinus tachycardia Low voltage, precordial leads no  STEMI. no change Confirmed by Donnald Garre, MD, Lebron Conners [phone]) on 03/17/2015  5:14:57 PM      MDM   Final diagnoses:  Chest pain, unspecified chest pain type  Epigastric pain   Diagnostic workup does not indicate acute ischemic type of chest pain. Patient stable upon presentation. There is some history of anxiety however the patient does not feel she was anxious at the time of onset of symptoms. With age and risk factors, she has lower risk of coronary artery disease. CT chest will be obtained to rule out for PE. Pending return of negative CT  chest I do feel patient stable for discharge and continued outpatient management and follow-up with her family physician provider.    Arby Barrette, MD 03/27/15 [phone]

## 2015-03-17 NOTE — ED Notes (Signed)
EKG delayed due to patient's need to use the restroom, patient stating she is on her menstrual cycle and could not wait until EKG was performed to use the restroom.

## 2015-03-17 NOTE — ED Notes (Signed)
Patient transported to CT 

## 2015-03-17 NOTE — Discharge Instructions (Signed)

## 2015-03-18 NOTE — ED Provider Notes (Signed)
Awaiting imaging results, care from Dr. Donnald Garre.  Imaging normal. Stable for discharge.  Chest pain, unspecified chest pain type  Epigastric pain    Marissa Mocha, MD 03/18/15 0200

## 2015-04-04 ENCOUNTER — Other Ambulatory Visit (HOSPITAL_COMMUNITY): Payer: Self-pay | Admitting: Internal Medicine

## 2015-04-04 DIAGNOSIS — R1010 Upper abdominal pain, unspecified: Secondary | ICD-10-CM

## 2015-04-04 DIAGNOSIS — R110 Nausea: Secondary | ICD-10-CM

## 2015-04-07 ENCOUNTER — Ambulatory Visit (HOSPITAL_COMMUNITY)
Admission: RE | Admit: 2015-04-07 | Discharge: 2015-04-07 | Disposition: A | Discharge status: Home / Self Care | Payer: BLUE CROSS/BLUE SHIELD | Source: Ambulatory Visit | Attending: Internal Medicine | Admitting: Internal Medicine

## 2015-04-07 DIAGNOSIS — K824 Cholesterolosis of gallbladder: Secondary | ICD-10-CM | POA: Diagnosis not present

## 2015-04-07 DIAGNOSIS — I10 Essential (primary) hypertension: Secondary | ICD-10-CM | POA: Insufficient documentation

## 2015-04-07 DIAGNOSIS — R110 Nausea: Secondary | ICD-10-CM | POA: Insufficient documentation

## 2015-04-07 DIAGNOSIS — R1010 Upper abdominal pain, unspecified: Secondary | ICD-10-CM | POA: Diagnosis not present

## 2015-04-11 ENCOUNTER — Emergency Department (HOSPITAL_COMMUNITY)
Admission: EM | Admit: 2015-04-11 | Discharge: 2015-04-11 | Disposition: A | Discharge status: Home / Self Care | Payer: BLUE CROSS/BLUE SHIELD | Attending: Emergency Medicine | Admitting: Emergency Medicine

## 2015-04-11 ENCOUNTER — Encounter (HOSPITAL_COMMUNITY): Payer: Self-pay

## 2015-04-11 ENCOUNTER — Emergency Department (HOSPITAL_COMMUNITY): Payer: BLUE CROSS/BLUE SHIELD

## 2015-04-11 DIAGNOSIS — Z3202 Encounter for pregnancy test, result negative: Secondary | ICD-10-CM | POA: Insufficient documentation

## 2015-04-11 DIAGNOSIS — I10 Essential (primary) hypertension: Secondary | ICD-10-CM | POA: Diagnosis not present

## 2015-04-11 DIAGNOSIS — Z79899 Other long term (current) drug therapy: Secondary | ICD-10-CM | POA: Insufficient documentation

## 2015-04-11 DIAGNOSIS — K279 Peptic ulcer, site unspecified, unspecified as acute or chronic, without hemorrhage or perforation: Secondary | ICD-10-CM | POA: Insufficient documentation

## 2015-04-11 DIAGNOSIS — Z720 Tobacco use: Secondary | ICD-10-CM | POA: Insufficient documentation

## 2015-04-11 DIAGNOSIS — F419 Anxiety disorder, unspecified: Secondary | ICD-10-CM | POA: Insufficient documentation

## 2015-04-11 DIAGNOSIS — R079 Chest pain, unspecified: Secondary | ICD-10-CM | POA: Diagnosis present

## 2015-04-11 DIAGNOSIS — K8050 Calculus of bile duct without cholangitis or cholecystitis without obstruction: Secondary | ICD-10-CM | POA: Insufficient documentation

## 2015-04-11 DIAGNOSIS — Z91040 Latex allergy status: Secondary | ICD-10-CM | POA: Insufficient documentation

## 2015-04-11 DIAGNOSIS — F329 Major depressive disorder, single episode, unspecified: Secondary | ICD-10-CM | POA: Insufficient documentation

## 2015-04-11 DIAGNOSIS — R1013 Epigastric pain: Secondary | ICD-10-CM

## 2015-04-11 LAB — URINALYSIS, ROUTINE W REFLEX MICROSCOPIC
Bilirubin Urine: NEGATIVE
GLUCOSE, UA: NEGATIVE mg/dL
HGB URINE DIPSTICK: NEGATIVE
Ketones, ur: NEGATIVE mg/dL
LEUKOCYTES UA: NEGATIVE
Nitrite: NEGATIVE
PH: 5.5 (ref 5.0–8.0)
Protein, ur: NEGATIVE mg/dL
Urobilinogen, UA: 0.2 mg/dL (ref 0.0–1.0)

## 2015-04-11 LAB — BASIC METABOLIC PANEL
Anion gap: 8 (ref 5–15)
BUN: 8 mg/dL (ref 6–20)
CALCIUM: 8.9 mg/dL (ref 8.9–10.3)
CHLORIDE: 106 mmol/L (ref 101–111)
CO2: 25 mmol/L (ref 22–32)
CREATININE: 0.83 mg/dL (ref 0.44–1.00)
Glucose, Bld: 102 mg/dL — ABNORMAL HIGH (ref 65–99)
Potassium: 3.4 mmol/L — ABNORMAL LOW (ref 3.5–5.1)
SODIUM: 139 mmol/L (ref 135–145)

## 2015-04-11 LAB — HEPATIC FUNCTION PANEL
ALT: 18 U/L (ref 14–54)
AST: 19 U/L (ref 15–41)
Albumin: 4.2 g/dL (ref 3.5–5.0)
Alkaline Phosphatase: 82 U/L (ref 38–126)
Bilirubin, Direct: <0.1 mg/dL — ABNORMAL LOW (ref 0.1–0.5)
Total Bilirubin: 0.4 mg/dL (ref 0.3–1.2)
Total Protein: 7.4 g/dL (ref 6.5–8.1)

## 2015-04-11 LAB — CBC
HCT: 43.2 % (ref 36.0–46.0)
Hemoglobin: 13.7 g/dL (ref 12.0–15.0)
MCH: 27.7 pg (ref 26.0–34.0)
MCHC: 31.7 g/dL (ref 30.0–36.0)
MCV: 87.3 fL (ref 78.0–100.0)
Platelets: 366 10*3/uL (ref 150–400)
RBC: 4.95 MIL/uL (ref 3.87–5.11)
RDW: 14.7 % (ref 11.5–15.5)
WBC: 9.5 10*3/uL (ref 4.0–10.5)

## 2015-04-11 LAB — PREGNANCY, URINE: Preg Test, Ur: NEGATIVE

## 2015-04-11 LAB — TROPONIN I: Troponin I: <0.03 ng/mL (ref <0.031)

## 2015-04-11 LAB — LIPASE, BLOOD: Lipase: 44 U/L (ref 11–51)

## 2015-04-11 MED ORDER — ONDANSETRON 4 MG PO TBDP: 4.0000 mg | ORAL_TABLET | Freq: Three times a day (TID) | ORAL | Status: DC | PRN

## 2015-04-11 MED ORDER — OMEPRAZOLE 20 MG PO CPDR: 20.0000 mg | DELAYED_RELEASE_CAPSULE | Freq: Two times a day (BID) | ORAL | Status: DC

## 2015-04-11 MED ORDER — HYDROCODONE-ACETAMINOPHEN 5-325 MG PO TABS: 2.0000 | ORAL_TABLET | ORAL | Status: DC | PRN

## 2015-04-11 MED ORDER — FENTANYL CITRATE (PF) 100 MCG/2ML IJ SOLN
50.0000 ug | INTRAMUSCULAR | Status: DC | PRN
  Administered 2015-04-11: 50 ug via INTRAVENOUS
  Filled 2015-04-11: qty 2

## 2015-04-11 MED ORDER — ONDANSETRON HCL 4 MG/2ML IJ SOLN
4.0000 mg | Freq: Once | INTRAMUSCULAR | Status: AC
  Administered 2015-04-11: 4 mg via INTRAVENOUS
  Filled 2015-04-11: qty 2

## 2015-04-11 NOTE — Discharge Instructions (Signed)
Bland and, complete no fat diet. (Google "Diet for gallstones"). Keep your appointment with Dr. Lovell Sheehan, the surgeon. Continue Prilosec until your symptoms have resolved. No alcohol, tobacco, caffeine, or anti-inflammatory such as aspirin or ibuprofen.    Abdominal Pain, Adult Many things can cause abdominal pain. Usually, abdominal pain is not caused by a disease and will improve without treatment. It can often be observed and treated at home. Your health care provider will do a physical exam and possibly order blood tests and X-rays to help determine the seriousness of your pain. However, in many cases, more time must pass before a clear cause of the pain can be found. Before that point, your health care provider may not know if you need more testing or further treatment. HOME CARE INSTRUCTIONS Monitor your abdominal pain for any changes. The following actions may help to alleviate any discomfort you are experiencing:  Only take over-the-counter or prescription medicines as directed by your health care provider.  Do not take laxatives unless directed to do so by your health care provider.  Try a clear liquid diet (broth, tea, or water) as directed by your health care provider. Slowly move to a bland diet as tolerated. SEEK MEDICAL CARE IF:  You have unexplained abdominal pain.  You have abdominal pain associated with nausea or diarrhea.  You have pain when you urinate or have a bowel movement.  You experience abdominal pain that wakes you in the night.  You have abdominal pain that is worsened or improved by eating food.  You have abdominal pain that is worsened with eating fatty foods.  You have a fever. SEEK IMMEDIATE MEDICAL CARE IF:  Your pain does not go away within 2 hours.  You keep throwing up (vomiting).  Your pain is felt only in portions of the abdomen, such as the right side or the left lower portion of the abdomen.  You pass bloody or black tarry stools. MAKE  SURE YOU:  Understand these instructions.  Will watch your condition.  Will get help right away if you are not doing well or get worse.   This information is not intended to replace advice given to you by your health care provider. Make sure you discuss any questions you have with your health care provider.   Document Released: 03/10/2005 Document Revised: 02/19/2015 Document Reviewed: 02/07/2013 Elsevier Interactive Patient Education 2016 [employer]  HIDA (Hepatobiliary) Scan A hepatobiliary scan, also known as a HIDA scan, is a type of imaging test that evaluates the function of your liver, gallbladder, and their ducts. These parts make up your hepatobiliary system. You may need this scan if you have symptoms of liver or gallbladder disease.  A HIDA scan uses a special camera that detects radioactive energy (gamma rays). For this exam, you will be given a radioactive substance, called a radiotracer, through an IV tube inserted in your hand or arm. As the radiotracer moves through your hepatobiliary system, the camera and a computer detect the gamma rays and form them into images that show how well your system is working. LET [facility] CARE PROVIDER KNOW ABOUT:  Any possibility of pregnancy or if you are breastfeeding.  Any allergies you have.  All medicines you are taking, including vitamins, herbs, eye drops, creams, and over-the-counter medicines.  Previous problems you or members of your family have had with the use of anesthetics.  Any blood disorders you have.  Previous surgeries you have had.  Medical conditions you have. BEFORE THE  PROCEDURE  Take medicines only as directed by your health care provider.  Your health care provider will let you know when you should start fasting. You may not be able to eat or drink anything after midnight on the night before the procedure or for at least four hours before the test.  Do not wear jewelry to the exam.  Wear loose,  comfortable clothing. You may be asked to put on a gown for the procedure. PROCEDURE   An IV tube will be inserted into a vein in your hand or arm. It will remain in place throughout the exam.  A small amount of the radiotracer material will be injected through your IV tube.  You may feel a cold sensation as the material runs through your IV tube.  While you are lying down, a technician will place the gamma camera over your abdomen.  The camera may rotate around your body. You may be asked to stay still or move into a certain position.  You will then be given a medicine called cholecystokinin (CCK) through your IV tube. This will make your gallbladder empty. You may feel nauseous or have cramping for a short time.  Additional images will be taken after CCK is given.  After all the images have been taken, your IV tube will be removed. AFTER THE PROCEDURE  You may resume normal activities and diet as directed by your health care provider.  The radiotracer will leave your body over the next few days. There are no long-term side effects from the radiotracer. Drink lots of fluids to help flush it out of your body.  A health care provider who specializes in nuclear medicine will read the scan and send a report to your regular health care provider.   This information is not intended to replace advice given to you by your health care provider. Make sure you discuss any questions you have with your health care provider.   Document Released: 05/28/2000 Document Revised: 06/21/2014 Document Reviewed: 10/03/2013 Elsevier Interactive Patient Education 2016 [employer]  Peptic Ulcer A peptic ulcer is a sore in the lining of your esophagus (esophageal ulcer), stomach (gastric ulcer), or in the first part of your small intestine (duodenal ulcer). The ulcer causes erosion into the deeper tissue. CAUSES  Normally, the lining of the stomach and the small intestine protects itself from the acid  that digests food. The protective lining can be damaged by:  An infection caused by a bacterium called Helicobacter pylori (H. pylori).  Regular use of nonsteroidal anti-inflammatory drugs (NSAIDs), such as ibuprofen or aspirin.  Smoking tobacco. Other risk factors include being older than 50, drinking alcohol excessively, and having a family history of ulcer disease.  SYMPTOMS   Burning pain or gnawing in the area between the chest and the belly button.  Heartburn.  Nausea and vomiting.  Bloating. The pain can be worse on an empty stomach and at night. If the ulcer results in bleeding, it can cause:  Black, tarry stools.  Vomiting of bright red blood.  Vomiting of coffee-ground-looking materials. DIAGNOSIS  A diagnosis is usually made based upon your history and an exam. Other tests and procedures may be performed to find the cause of the ulcer. Finding a cause will help determine the best treatment. Tests and procedures may include:  Blood tests, stool tests, or breath tests to check for the bacterium H. pylori.  An upper gastrointestinal (GI) series of the esophagus, stomach, and small intestine.  An endoscopy  to examine the esophagus, stomach, and small intestine.  A biopsy. TREATMENT  Treatment may include:  Eliminating the cause of the ulcer, such as smoking, NSAIDs, or alcohol.  Medicines to reduce the amount of acid in your digestive tract.  Antibiotic medicines if the ulcer is caused by the H. pylori bacterium.  An upper endoscopy to treat a bleeding ulcer.  Surgery if the bleeding is severe or if the ulcer created a hole somewhere in the digestive system. HOME CARE INSTRUCTIONS   Avoid tobacco, alcohol, and caffeine. Smoking can increase the acid in the stomach, and continued smoking will impair the healing of ulcers.  Avoid foods and drinks that seem to cause discomfort or aggravate your ulcer.  Only take medicines as directed by your caregiver. Do not  substitute over-the-counter medicines for prescription medicines without talking to your caregiver.  Keep any follow-up appointments and tests as directed. SEEK MEDICAL CARE IF:   Your do not improve within 7 days of starting treatment.  You have ongoing indigestion or heartburn. SEEK IMMEDIATE MEDICAL CARE IF:   You have sudden, sharp, or persistent abdominal pain.  You have bloody or dark black, tarry stools.  You vomit blood or vomit that looks like coffee grounds.  You become light-headed, weak, or feel faint.  You become sweaty or clammy. MAKE SURE YOU:   Understand these instructions.  Will watch your condition.  Will get help right away if you are not doing well or get worse.   This information is not intended to replace advice given to you by your health care provider. Make sure you discuss any questions you have with your health care provider.   Document Released: 05/28/2000 Document Revised: 06/21/2014 Document Reviewed: 12/29/2011 Elsevier Interactive Patient Education [employer]

## 2015-04-11 NOTE — ED Provider Notes (Signed)
CSN: [id number]     Arrival date & time 04/11/15  1800 History   First MD Initiated Contact with Patient 04/11/15 1806     Chief Complaint  Patient presents with  . Chest Pain     HPI  Patient presents for evaluation of abdominal pain. She has had intermittent episodes of epigastric and right upper quadrant pain over the last several weeks. Underwent extensive evaluation for chest pain including enzymes, EKG, and CT angiogram on the third of this month. Since and has noticed that her pain is clearly more abdominal. She does have some pain awakening at night. Most often precipitated by food. Unfortunately ate a fried pork chop yesterday and states she had the worst pain she is a refill for 2 hours as pain today but was still with abdominal and upper quadrant pain and nausea and presents here.  She does smoke. Occasional alcohol, occasionally to inflammatory's, daily caffeine.  Past Medical History  Diagnosis Date  . Hypertension   . Headache(784.0)   . Depression   . Anxiety    History reviewed. No pertinent past surgical history. Family History  Problem Relation Age of Onset  . Stroke Other   . Diabetes Other   . Cancer Other   . Mental illness Other   . Hypertension Other    Social History  Substance Use Topics  . Smoking status: Current Every Day Smoker -- 1.00 packs/day for 17 years    Types: Cigarettes  . Smokeless tobacco: Never Used  . Alcohol Use: Yes     Comment: occ   OB History    Gravida Para Term Preterm AB TAB SAB Ectopic Multiple Living   2 2 2       2      Review of Systems  Constitutional: Negative for fever, chills, diaphoresis, appetite change and fatigue.  HENT: Negative for mouth sores, sore throat and trouble swallowing.   Eyes: Negative for visual disturbance.  Respiratory: Negative for cough, chest tightness, shortness of breath and wheezing.   Cardiovascular: Negative for chest pain.  Gastrointestinal: Positive for nausea and abdominal pain.  Negative for vomiting, diarrhea and abdominal distention.  Endocrine: Negative for polydipsia, polyphagia and polyuria.  Genitourinary: Negative for dysuria, frequency and hematuria.  Musculoskeletal: Negative for gait problem.  Skin: Negative for color change, pallor and rash.  Neurological: Negative for dizziness, syncope, light-headedness and headaches.  Hematological: Does not bruise/bleed easily.  Psychiatric/Behavioral: Negative for behavioral problems and confusion.      Allergies  Zolpidem tartrate and Latex  Home Medications   Prior to Admission medications   Medication Sig Start Date End Date Taking? Authorizing Provider  buPROPion (WELLBUTRIN XL) 300 MG 24 hr tablet Take 300 mg by mouth daily.   Yes Historical Provider, MD  ibuprofen (ADVIL,MOTRIN) 200 MG tablet Take 800 mg by mouth every 8 (eight) hours as needed. For pain   Yes Historical Provider, MD  lisinopril (PRINIVIL,ZESTRIL) 5 MG tablet Take 5 mg by mouth daily.   Yes Historical Provider, MD  HYDROcodone-acetaminophen (NORCO/VICODIN) 5-325 MG tablet Take 2 tablets by mouth every 4 (four) hours as needed. 04/11/15   Rolland PorterMark Kit Brubacher, MD  omeprazole (PRILOSEC) 20 MG capsule Take 1 capsule (20 mg total) by mouth 2 (two) times daily. 04/11/15   Rolland PorterMark Miara Emminger, MD  ondansetron (ZOFRAN ODT) 4 MG disintegrating tablet Take 1 tablet (4 mg total) by mouth every 8 (eight) hours as needed for nausea. 04/11/15   Rolland PorterMark Azizi Bally, MD   BP 126/85 mmHg  Pulse 103  Temp(Src) 98 F (36.7 C) (Oral)  Resp 24  Ht 5' (1.524 m)  Wt 155 lb (70.308 kg)  BMI 30.27 kg/m2  SpO2 100%  LMP 03/17/2015 (Exact Date) Physical Exam  Constitutional: She is oriented to person, place, and time. She appears well-developed and well-nourished. No distress.  HENT:  Head: Normocephalic.  Eyes: Conjunctivae are normal. Pupils are equal, round, and reactive to light. No scleral icterus.  Neck: Normal range of motion. Neck supple. No thyromegaly present.    Cardiovascular: Normal rate and regular rhythm.  Exam reveals no gallop and no friction rub.   No murmur heard. Pulmonary/Chest: Effort normal and breath sounds normal. No respiratory distress. She has no wheezes. She has no rales.  Abdominal: Soft. Bowel sounds are normal. She exhibits no distension. There is no tenderness. There is no rebound.    Musculoskeletal: Normal range of motion.  Neurological: She is alert and oriented to person, place, and time.  Skin: Skin is warm and dry. No rash noted.  Psychiatric: She has a normal mood and affect. Her behavior is normal.    ED Course  Procedures (including critical care time) Labs Review Labs Reviewed  BASIC METABOLIC PANEL - Abnormal; Notable for the following:    Potassium 3.4 (*)    Glucose, Bld 102 (*)    All other components within normal limits  URINALYSIS, ROUTINE W REFLEX MICROSCOPIC (NOT AT [facility]) - Abnormal; Notable for the following:    Specific Gravity, Urine >1.030 (*)    All other components within normal limits  HEPATIC FUNCTION PANEL - Abnormal; Notable for the following:    Bilirubin, Direct <0.1 (*)    All other components within normal limits  CBC  TROPONIN I  PREGNANCY, URINE  LIPASE, BLOOD    Imaging Review No results found. I have personally reviewed and evaluated these images and lab results as part of my medical decision-making.   EKG Interpretation None      MDM   Final diagnoses:  Epigastric pain  Biliary colic  Peptic ulcer    Her symptoms have improved. She has no elevation of white blood cell count or hepatobiliary enzymes. I think this is very likely dysfunctional gallbladder. However she does have some pain at night. The other differential diagnosis still on the table would be an acid related phenomenon such as gastric or duodenal ulcer. She has pending surgical consultation. She is appropriate for outpatient discharge tonight. Thus her to stop alcohol, tobacco, caffeine, and  anti-inflammatory medications. I given her prescription for Prilosec, Vicodin and Zofran. Vascular to follow a low-fat bland "gallbladder" diet. Recheck here with any worsening or failure to improve outpatient.    Rolland Porter, MD 04/11/15 2046

## 2015-04-11 NOTE — ED Notes (Signed)
Discharge instructions given, pt demonstrated teach back and verbal understanding. No concerns voiced.  

## 2015-04-11 NOTE — ED Notes (Signed)
Patient requesting pregnancy test prior to x-ray.

## 2015-04-11 NOTE — ED Notes (Signed)
Pt complain of chest pain that has been going on for two to three weeks. States she thinks it is her gallbladder because she had these symptoms before and it was her gallbladder. States she is scheduled for a consultation with Dr. Lovell SheehanJenkins on November 15th to have her gallbladder removed. Also, complain of nausea. States she took one of her husbands Zofran prior to coming to the ED

## 2015-04-11 NOTE — ED Notes (Signed)
MD at bedside. 

## 2015-05-05 ENCOUNTER — Other Ambulatory Visit: Payer: Self-pay | Admitting: General Surgery

## 2015-06-15 NOTE — L&D Delivery Note (Signed)
35 y.o. G3P2002 at 6238w5d delivered a viable female infant in cephalic, ROA position. No nuchal cord. Left anterior shoulder delivered with ease. 45 sec delayed cord clamping, stopped due to brisk vaginal bleeding. Cord clamped x2 and cut. Placenta delivered spontaneously intact, with 3VC. Fundus firm on exam with massage and pitocin, given 1000mcg cytotec. Good hemostasis noted.  Laceration: None Suture: N/A Good hemostasis noted.  Mom and baby recovering in LDR.    Apgars: 9/9 Weight: pending, skin to skin EBL: 400cc    Jen MowElizabeth Mumaw, DO [facility] 03/07/2016, 8:46 AM

## 2015-07-07 ENCOUNTER — Encounter (HOSPITAL_COMMUNITY): Payer: Self-pay | Admitting: *Deleted

## 2015-07-07 ENCOUNTER — Emergency Department (HOSPITAL_COMMUNITY): Payer: BLUE CROSS/BLUE SHIELD

## 2015-07-07 ENCOUNTER — Emergency Department (HOSPITAL_COMMUNITY)
Admission: EM | Admit: 2015-07-07 | Discharge: 2015-07-07 | Disposition: A | Discharge status: Home / Self Care | Payer: BLUE CROSS/BLUE SHIELD | Attending: Emergency Medicine | Admitting: Emergency Medicine

## 2015-07-07 DIAGNOSIS — F17210 Nicotine dependence, cigarettes, uncomplicated: Secondary | ICD-10-CM | POA: Diagnosis not present

## 2015-07-07 DIAGNOSIS — Z91040 Latex allergy status: Secondary | ICD-10-CM | POA: Diagnosis not present

## 2015-07-07 DIAGNOSIS — R1013 Epigastric pain: Secondary | ICD-10-CM | POA: Insufficient documentation

## 2015-07-07 DIAGNOSIS — Z79899 Other long term (current) drug therapy: Secondary | ICD-10-CM | POA: Diagnosis not present

## 2015-07-07 DIAGNOSIS — R079 Chest pain, unspecified: Secondary | ICD-10-CM | POA: Insufficient documentation

## 2015-07-07 DIAGNOSIS — F329 Major depressive disorder, single episode, unspecified: Secondary | ICD-10-CM | POA: Diagnosis not present

## 2015-07-07 DIAGNOSIS — I10 Essential (primary) hypertension: Secondary | ICD-10-CM | POA: Insufficient documentation

## 2015-07-07 DIAGNOSIS — F419 Anxiety disorder, unspecified: Secondary | ICD-10-CM | POA: Diagnosis not present

## 2015-07-07 LAB — CBC WITH DIFFERENTIAL/PLATELET
BASOS ABS: 0 10*3/uL (ref 0.0–0.1)
BASOS PCT: 0 %
Eosinophils Absolute: 0.1 10*3/uL (ref 0.0–0.7)
Eosinophils Relative: 1 %
HEMATOCRIT: 42.5 % (ref 36.0–46.0)
HEMOGLOBIN: 13.5 g/dL (ref 12.0–15.0)
LYMPHS PCT: 29 %
Lymphs Abs: 3.4 10*3/uL (ref 0.7–4.0)
MCH: 27.9 pg (ref 26.0–34.0)
MCHC: 31.8 g/dL (ref 30.0–36.0)
MCV: 87.8 fL (ref 78.0–100.0)
MONO ABS: 0.8 10*3/uL (ref 0.1–1.0)
MONOS PCT: 7 %
NEUTROS ABS: 7.6 10*3/uL (ref 1.7–7.7)
NEUTROS PCT: 63 %
Platelets: 365 10*3/uL (ref 150–400)
RBC: 4.84 MIL/uL (ref 3.87–5.11)
RDW: 14.7 % (ref 11.5–15.5)
WBC: 12 10*3/uL — ABNORMAL HIGH (ref 4.0–10.5)

## 2015-07-07 LAB — COMPREHENSIVE METABOLIC PANEL
ALBUMIN: 4.2 g/dL (ref 3.5–5.0)
ALK PHOS: 94 U/L (ref 38–126)
ALT: 19 U/L (ref 14–54)
AST: 19 U/L (ref 15–41)
Anion gap: 7 (ref 5–15)
BILIRUBIN TOTAL: 0.5 mg/dL (ref 0.3–1.2)
BUN: 8 mg/dL (ref 6–20)
CALCIUM: 8.8 mg/dL — AB (ref 8.9–10.3)
CO2: 23 mmol/L (ref 22–32)
Chloride: 107 mmol/L (ref 101–111)
Creatinine, Ser: 0.75 mg/dL (ref 0.44–1.00)
GFR calc Af Amer: >60 mL/min (ref >60)
GLUCOSE: 98 mg/dL (ref 65–99)
POTASSIUM: 3.8 mmol/L (ref 3.5–5.1)
Sodium: 137 mmol/L (ref 135–145)
TOTAL PROTEIN: 7.4 g/dL (ref 6.5–8.1)

## 2015-07-07 LAB — LIPASE, BLOOD: Lipase: 45 U/L (ref 11–51)

## 2015-07-07 LAB — TROPONIN I: Troponin I: <0.03 ng/mL (ref <0.031)

## 2015-07-07 MED ORDER — TRAMADOL HCL 50 MG PO TABS
50.0000 mg | ORAL_TABLET | Freq: Once | ORAL | Status: AC
  Administered 2015-07-07: 50 mg via ORAL
  Filled 2015-07-07: qty 1

## 2015-07-07 MED ORDER — TRAMADOL HCL 50 MG PO TABS: 50.0000 mg | ORAL_TABLET | Freq: Four times a day (QID) | ORAL | Status: DC | PRN

## 2015-07-07 MED ORDER — PANTOPRAZOLE SODIUM 40 MG IV SOLR
40.0000 mg | Freq: Once | INTRAVENOUS | Status: AC
  Administered 2015-07-07: 40 mg via INTRAVENOUS
  Filled 2015-07-07: qty 40

## 2015-07-07 NOTE — ED Notes (Signed)
MD at bedside. 

## 2015-07-07 NOTE — Discharge Instructions (Signed)
Start taking your stomach acid medicine and  Follow up with your md later this week.

## 2015-07-07 NOTE — ED Notes (Addendum)
Pt c/o mid center chest pain that is described as a dullness that started this am, pt reports that she had one episode similar earlier in the week and it went away on its own, pain is non radiating, associated with nausea, pt had gallbladder surgery in November 2016

## 2015-07-07 NOTE — ED Provider Notes (Signed)
CSN: [id number]     Arrival date & time 07/07/15  4098 History   First MD Initiated Contact with Patient 07/07/15 [phone]     Chief Complaint  Patient presents with  . Chest Pain     (Consider location/radiation/quality/duration/timing/severity/associated sxs/prior Treatment) Patient is a 35 y.o. female presenting with chest pain. The history is provided by the patient (The patient states that she awoke this morning with epigastric pain. This lasted for about an hour).  Chest Pain Pain location:  Epigastric Pain quality: aching   Pain radiates to:  Lower back Pain radiates to the back: yes   Pain severity:  Moderate Onset quality:  Sudden Timing:  Constant Progression:  Resolved Chronicity:  New Context: not breathing   Relieved by:  Nothing Associated symptoms: no abdominal pain, no back pain, no cough, no fatigue and no headache     Past Medical History  Diagnosis Date  . Hypertension   . Headache(784.0)   . Depression   . Anxiety    Past Surgical History  Procedure Laterality Date  . Cholecystectomy     Family History  Problem Relation Age of Onset  . Stroke Other   . Diabetes Other   . Cancer Other   . Mental illness Other   . Hypertension Other    Social History  Substance Use Topics  . Smoking status: Current Every Day Smoker -- 1.00 packs/day for 17 years    Types: Cigarettes  . Smokeless tobacco: Never Used  . Alcohol Use: Yes     Comment: occ   OB History    Gravida Para Term Preterm AB TAB SAB Ectopic Multiple Living   Review of Systems  Constitutional: Negative for appetite change and fatigue.  HENT: Negative for congestion, ear discharge and sinus pressure.   Eyes: Negative for discharge.  Respiratory: Negative for cough.   Cardiovascular: Positive for chest pain.  Gastrointestinal: Negative for abdominal pain and diarrhea.  Genitourinary: Negative for frequency and hematuria.  Musculoskeletal: Negative for back pain.   Skin: Negative for rash.  Neurological: Negative for seizures and headaches.  Psychiatric/Behavioral: Negative for hallucinations.      Allergies  Zolpidem tartrate and Latex  Home Medications   Prior to Admission medications   Medication Sig Start Date End Date Taking? Authorizing Provider  buPROPion (WELLBUTRIN XL) 300 MG 24 hr tablet Take 300 mg by mouth daily.   Yes Historical Provider, MD  busPIRone (BUSPAR) 7.5 MG tablet Take 7.5 mg by mouth daily as needed (anxiety).   Yes Historical Provider, MD  ibuprofen (ADVIL,MOTRIN) 200 MG tablet Take 800 mg by mouth every 8 (eight) hours as needed. For pain   Yes Historical Provider, MD  lisinopril (PRINIVIL,ZESTRIL) 5 MG tablet Take 5 mg by mouth daily.   Yes Historical Provider, MD  HYDROcodone-acetaminophen (NORCO/VICODIN) 5-325 MG tablet Take 2 tablets by mouth every 4 (four) hours as needed. Patient not taking: Reported on 07/07/2015 04/11/15   Rolland Porter, MD  omeprazole (PRILOSEC) 20 MG capsule Take 1 capsule (20 mg total) by mouth 2 (two) times daily. Patient not taking: Reported on 07/07/2015 04/11/15   Rolland Porter, MD  ondansetron (ZOFRAN ODT) 4 MG disintegrating tablet Take 1 tablet (4 mg total) by mouth every 8 (eight) hours as needed for nausea. Patient not taking: Reported on 07/07/2015 04/11/15   Rolland Porter, MD  traMADol (ULTRAM) 50 MG tablet Take 1 tablet (50 mg  total) by mouth every 6 (six) hours as needed. 07/07/15   Bethann Berkshire, MD   BP 104/66 mmHg  Pulse 82  Temp(Src) 99.1 F (37.3 C) (Oral)  Resp 17  Ht 5' (1.524 m)  Wt 150 lb (68.04 kg)  BMI 29.30 kg/m2  SpO2 100%  LMP 06/10/2015 Physical Exam  Constitutional: She is oriented to person, place, and time. She appears well-developed.  HENT:  Head: Normocephalic.  Eyes: Conjunctivae and EOM are normal. No scleral icterus.  Neck: Neck supple. No thyromegaly present.  Cardiovascular: Normal rate and regular rhythm.  Exam reveals no gallop and no friction rub.    No murmur heard. Pulmonary/Chest: No stridor. She has no wheezes. She has no rales. She exhibits no tenderness.  Abdominal: She exhibits no distension. There is no tenderness. There is no rebound.  Musculoskeletal: Normal range of motion. She exhibits no edema.  Lymphadenopathy:    She has no cervical adenopathy.  Neurological: She is oriented to person, place, and time. She exhibits normal muscle tone. Coordination normal.  Skin: No rash noted. No erythema.  Psychiatric: She has a normal mood and affect. Her behavior is normal.    ED Course  Procedures (including critical care time) Labs Review Labs Reviewed  CBC WITH DIFFERENTIAL/PLATELET - Abnormal; Notable for the following:    WBC 12.0 (*)    All other components within normal limits  COMPREHENSIVE METABOLIC PANEL - Abnormal; Notable for the following:    Calcium 8.8 (*)    All other components within normal limits  TROPONIN I  LIPASE, BLOOD  TROPONIN I    Imaging Review Dg Chest 2 View  07/07/2015  CLINICAL DATA:  Chest pain EXAM: CHEST  2 VIEW COMPARISON:  Chest radiograph and chest CT March 17, 2015 FINDINGS: Lungs are clear. Heart size and pulmonary vascularity are normal. No adenopathy. No pneumothorax. No bone lesions. IMPRESSION: No edema or consolidation. Electronically Signed   By: Bretta Bang III M.D.   On: 07/07/2015 07:10   I have personally reviewed and evaluated these images and lab results as part of my medical decision-making.   EKG Interpretation   Date/Time:  Monday July 07 2015 06:29:36 EST Ventricular Rate:  92 PR Interval:  145 QRS Duration: 92 QT Interval:  352 QTC Calculation: 435 R Axis:   30 Text Interpretation:  Sinus rhythm Low voltage, extremity and precordial  leads Baseline wander in lead(s) II V6 No significant change since last  tracing other than rate is slower Confirmed by WARD,  DO, KRISTEN ([id number])  on 07/07/2015 6:37:10 AM      MDM   Final diagnoses:  Chest pain  at rest    Labs including 2 troponins chest x-ray EKG all unremarkable. Patient's symptoms improved on their own. Patient is a start taking proton she is given prescription of Ultram she'll follow-up with her PCP this week    Bethann Berkshire, MD 07/07/15 [phone]

## 2015-07-14 ENCOUNTER — Encounter: Payer: Self-pay | Admitting: Cardiology

## 2015-07-14 ENCOUNTER — Ambulatory Visit (INDEPENDENT_AMBULATORY_CARE_PROVIDER_SITE_OTHER): Payer: BLUE CROSS/BLUE SHIELD | Admitting: Cardiology

## 2015-07-14 VITALS — BP 116/76 | HR 90 | Ht 60.0 in | Wt 147.0 lb

## 2015-07-14 DIAGNOSIS — K219 Gastro-esophageal reflux disease without esophagitis: Secondary | ICD-10-CM | POA: Diagnosis not present

## 2015-07-14 DIAGNOSIS — Z8249 Family history of ischemic heart disease and other diseases of the circulatory system: Secondary | ICD-10-CM

## 2015-07-14 DIAGNOSIS — R072 Precordial pain: Secondary | ICD-10-CM

## 2015-07-14 DIAGNOSIS — I10 Essential (primary) hypertension: Secondary | ICD-10-CM

## 2015-07-14 NOTE — Progress Notes (Signed)
Cardiology Office Note  Date: 07/14/2015   ID: Marissa Reese, DOB 24-May-1981, MRN [id number]  PCP: Dwana Melena, MD  Consulting Cardiologist: Nona Dell, MD   Chief Complaint  Patient presents with  . Chest Pain    History of Present Illness: Marissa Reese is a 35 y.o. female referred for cardiology consultation by Dr. Dwana Melena. She was seen recently in the ER with chest pain and assessed by Dr. Estell Harpin. Troponin I levels were normal. I reviewed her ECG from 07/07/2015 which showed normal sinus rhythm with borderline low voltage. She states that she did follow-up with Dr. Margo Aye after the ER visit, but comes in today remaining concerned about her symptoms.  She tells me that she has had 2 episodes of chest discomfort. One occurred about a week before the one that prompted her ER visit. She describes a feeling of moderate tightness/pressure in her chest, the first episode occurred hours after eating a sausage biscuit. The second episode prompting ER visit occurred spontaneously, woke her up at nighttime, was associated with nausea and diaphoresis. Symptoms lasted for several minutes to greater than hour. Shee does not report any recurring chest tightness with exercise, reports NYHA class II dyspnea.  Her main concern in regards to these symptoms is the fact that she has premature CAD in her family. Her father had a heart attack at age 42. She is also worried because she has recently found out that she is about 5-[redacted] weeks pregnant, her third pregnancy. She states that she has been on antihypertensive medication since November 2016, but did have elevations in blood pressure with previous pregnancies.  Past Medical History  Diagnosis Date  . Essential hypertension     Medication since 04/2015  . History of headache   . Depression   . Anxiety     Past Surgical History  Procedure Laterality Date  . Cholecystectomy      Current Outpatient Prescriptions  Medication Sig  Dispense Refill  . buPROPion (WELLBUTRIN XL) 300 MG 24 hr tablet Take 300 mg by mouth daily.    . busPIRone (BUSPAR) 7.5 MG tablet Take 7.5 mg by mouth daily as needed (anxiety).    Marland Kitchen ibuprofen (ADVIL,MOTRIN) 200 MG tablet Take 800 mg by mouth every 8 (eight) hours as needed. For pain    . lisinopril (PRINIVIL,ZESTRIL) 5 MG tablet Take 5 mg by mouth daily. Reported on 07/14/2015     No current facility-administered medications for this visit.   Allergies:  Zolpidem tartrate and Latex   Social History: The patient  reports that she quit smoking yesterday. Her smoking use included Cigarettes. She started smoking about 24 years ago. She has a 17 pack-year smoking history. She has never used smokeless tobacco. She reports that she drinks alcohol. She reports that she does not use illicit drugs.   Family History: The patient's family history includes Diabetes in her father; Heart attack in her father; Hyperlipidemia in her mother; Hypertension in her father and mother.   ROS:  Please see the history of present illness. Otherwise, complete review of systems is positive for NYHA class II dyspnea with typical activities.  All other systems are reviewed and negative.   Physical Exam: VS:  BP 116/76 mmHg  Pulse 90  Ht 5' (1.524 m)  Wt 147 lb (66.679 kg)  BMI 28.71 kg/m2  SpO2 99%  LMP 06/10/2015, BMI Body mass index is 28.71 kg/(m^2).  Wt Readings from Last 3 Encounters:  07/14/15 147 lb (66.679  kg)  07/07/15 150 lb (68.04 kg)  04/11/15 155 lb (70.308 kg)    General: Young woman, appears comfortable at rest. HEENT: Conjunctiva and lids normal, oropharynx clear. Neck: Supple, no elevated JVP or carotid bruits, no thyromegaly. Lungs: Clear to auscultation, nonlabored breathing at rest. Cardiac: Regular rate and rhythm, no S3, soft systolic murmur, no pericardial rub. Abdomen: Soft, nontender, bowel sounds present, no guarding or rebound. Extremities: No pitting edema, distal pulses 2+. Skin:  Warm and dry. Musculoskeletal: No kyphosis. Neuropsychiatric: Alert and oriented x3, affect grossly appropriate.  ECG: ECG is not ordered today.  Recent Labwork: 07/07/2015: ALT 19; AST 19; BUN 8; Creatinine, Ser 0.75; Hemoglobin 13.5; Platelets 365; Potassium 3.8; Sodium 137   Other Studies Reviewed Today:  Chest x-ray 07/07/2015: FINDINGS: Lungs are clear. Heart size and pulmonary vascularity are normal. No adenopathy. No pneumothorax. No bone lesions.  IMPRESSION: No edema or consolidation.  CT of the chest/abdomen/pelvis 03/17/2015: FINDINGS: CT CHEST FINDINGS  Mediastinum/Lymph Nodes: Residual thymic tissue in the anterior superior mediastinum. No pathologic mediastinal, hilar or axillary lymphadenopathy. Normal heart size. No visible coronary atherosclerosis. Normal appearing thoracic aorta and proximal great vessels.  Lungs/Pleura: Pulmonary parenchyma clear without localized airspace consolidation, interstitial disease, or parenchymal nodules or masses. Central airways patent without significant bronchial wall thickening. No pleural effusions.  Musculoskeletal: Degenerative disc disease and spondylosis with endplate sclerosis at T7-8, T8-9, T9-10 and T10-11. 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  Other: Normal appearing thyroid gland.  CT ABDOMEN PELVIS FINDINGS  Hepatobiliary: Very small (less than 5 mm) cyst in the medial segment left lobe of liver, unchanged. No new or significant abnormality involving the liver. Gallbladder contracted (as there is food in the stomach) but normal in appearance. No biliary ductal dilation.  Pancreas: Normal.  Spleen: Normal.  Adrenals/Urinary Tract: Normal adrenal glands. Normal kidneys. No urinary tract calculi. Normal-appearing urinary bladder.  Stomach/Bowel: Stomach normal in appearance for degree of distention. Normal-appearing small bowel. Moderate stool burden throughout normal appearing  colon. Normal appendix in the right upper pelvis.  Vascular/Lymphatic: No visible aortoiliofemoral atherosclerosis. Widely patent visceral arteries. No pathologic lymphadenopathy in the abdomen or pelvis.  Reproductive: Tampon within the vagina. Normal-appearing uterus. Enlarged right ovary measuring approximately 4.0 x 2.7 x 2.6 cm, increased in size since 2012, without evidence of simple ovarian cysts. Normal-appearing left ovary.  Other: No ascites. Very small umbilical hernia containing fat.  Musculoskeletal: Regional skeleton intact.  IMPRESSION: 1. No acute cardiopulmonary disease. 2. No acute abnormalities involving the abdomen or pelvis. 3. Enlarged right ovary, a new finding since 2012, without evidence of simple cysts. Transabdominal and transvaginal pelvic ultrasound may be helpful in further evaluation, and this examination could be performed as an outpatient.  Assessment and Plan:  1. Recent episodes of precordial pain, typical and atypical features from an ischemic perspective, although CAD would be unusual at her age. She does have a history of premature CAD in her family, father had a heart attack at age 38. She also has personal history of hypertension. ECG done recently was nonspecific and troponin I levels were normal. She is very worried about her cardiac status, particularly now that she is embarking on her third pregnancy. We discussed option of either continued observation with further testing if symptoms recur, versus proceeding with an exercise echocardiogram for surveillance testing. She has opted for the latter choice, and testing will be arranged, hopefully to provide additional reassurance.  2. Essential hypertension by history, she has not been  taking lisinopril since she found out that she is pregnant. Keep follow-up with obstetrics and Dr. Margo Aye.  3. History of reflux, has been on PPIs an outpatient. Symptoms could be GI in etiology.  Current  medicines were reviewed with the patient today.   Orders Placed This Encounter  Procedures  . Echo stress    Disposition: Call with results.   Signed, Jonelle Sidle, MD, [facility] 07/14/2015 2:32 PM    [facility] at [facility] [address], [place], [place] [address] Phone: [phone]; Fax: [phone]

## 2015-07-14 NOTE — Patient Instructions (Signed)
Your physician recommends that you schedule a follow-up appointment in: to be determined after tests   Your physician has requested that you have a stress echocardiogram. For further information please visit [email or web address] Please follow instruction sheet as given.    Thank you for choosing [facility] !

## 2015-07-17 ENCOUNTER — Ambulatory Visit (INDEPENDENT_AMBULATORY_CARE_PROVIDER_SITE_OTHER): Payer: BLUE CROSS/BLUE SHIELD | Admitting: Adult Health

## 2015-07-17 ENCOUNTER — Encounter: Payer: Self-pay | Admitting: Adult Health

## 2015-07-17 ENCOUNTER — Telehealth: Payer: Self-pay | Admitting: Adult Health

## 2015-07-17 VITALS — BP 150/90 | HR 92 | Ht 60.0 in | Wt 147.0 lb

## 2015-07-17 DIAGNOSIS — N926 Irregular menstruation, unspecified: Secondary | ICD-10-CM | POA: Diagnosis not present

## 2015-07-17 DIAGNOSIS — Z3201 Encounter for pregnancy test, result positive: Secondary | ICD-10-CM | POA: Diagnosis not present

## 2015-07-17 DIAGNOSIS — O09899 Supervision of other high risk pregnancies, unspecified trimester: Secondary | ICD-10-CM | POA: Insufficient documentation

## 2015-07-17 DIAGNOSIS — O3680X0 Pregnancy with inconclusive fetal viability, not applicable or unspecified: Secondary | ICD-10-CM

## 2015-07-17 DIAGNOSIS — Z3490 Encounter for supervision of normal pregnancy, unspecified, unspecified trimester: Secondary | ICD-10-CM

## 2015-07-17 HISTORY — DX: Encounter for supervision of normal pregnancy, unspecified, unspecified trimester: Z34.90

## 2015-07-17 LAB — POCT URINE PREGNANCY: Preg Test, Ur: POSITIVE — AB

## 2015-07-17 MED ORDER — PRENATAL PLUS 27-1 MG PO TABS: 1.0000 | ORAL_TABLET | Freq: Every day | ORAL | Status: DC

## 2015-07-17 NOTE — Progress Notes (Addendum)
Subjective:     Patient ID: Marissa Reese, female   DOB: September 07, 1980, 35 y.o.   MRN: [id number]  HPI Marissa Reese is a 35 year old white female, married, in for UPT, she has missed 1 period and has breast tenderness and some cramping, +HPT.She is on Wellbutrin for anxiety and depression.She had gallbladder removed in November.Denies any spotting or nausea. PCP is Dr Margo Aye.  Review of Systems Patient denies any headaches, hearing loss, fatigue, blurred vision, shortness of breath, chest pain, abdominal pain, problems with bowel movements, urination, or intercourse. No joint pain or mood swings.See HPI for positives. Reviewed past medical,surgical, social and family history. Reviewed medications and allergies.     Objective:   Physical Exam BP 150/90 mmHg  Pulse 92  Ht 5' (1.524 m)  Wt 147 lb (66.679 kg)  BMI 28.71 kg/m2  LMP 06/10/2015 UPT+ about 5+[redacted] weeks pregnant by LMP with EDD 03/16/16, medicaid form given, Skin warm and dry. Neck: mid line trachea, normal thyroid, good ROM, no lymphadenopathy noted. Lungs: clear to ausculation bilaterally. Cardiovascular: regular rate and rhythm.     Assessment:     Pregnant     Plan:    Review handout on first trimester  Rx prenatal plus #30 take 1 daily with 11 refills Return in 1 week for dating [place] Ok to continue Wellbutrin for now

## 2015-07-17 NOTE — Patient Instructions (Signed)
First Trimester of Pregnancy The first trimester of pregnancy is from week 1 until the end of week 12 (months 1 through 3). A week after a sperm fertilizes an egg, the egg will implant on the wall of the uterus. This embryo will begin to develop into a baby. Genes from you and your partner are forming the baby. The female genes determine whether the baby is a boy or a girl. At 6-8 weeks, the eyes and face are formed, and the heartbeat can be seen on ultrasound. At the end of 12 weeks, all the baby's organs are formed.  Now that you are pregnant, you will want to do everything you can to have a healthy baby. Two of the most important things are to get good prenatal care and to follow your health care provider's instructions. Prenatal care is all the medical care you receive before the baby's birth. This care will help prevent, find, and treat any problems during the pregnancy and childbirth. BODY CHANGES Your body goes through many changes during pregnancy. The changes vary from woman to woman.   You may gain or lose a couple of pounds at first.  You may feel sick to your stomach (nauseous) and throw up (vomit). If the vomiting is uncontrollable, call your health care provider.  You may tire easily.  You may develop headaches that can be relieved by medicines approved by your health care provider.  You may urinate more often. Painful urination may mean you have a bladder infection.  You may develop heartburn as a result of your pregnancy.  You may develop constipation because certain hormones are causing the muscles that push waste through your intestines to slow down.  You may develop hemorrhoids or swollen, bulging veins (varicose veins).  Your breasts may begin to grow larger and become tender. Your nipples may stick out more, and the tissue that surrounds them (areola) may become darker.  Your gums may bleed and may be sensitive to brushing and flossing.  Dark spots or blotches (chloasma,  mask of pregnancy) may develop on your face. This will likely fade after the baby is born.  Your menstrual periods will stop.  You may have a loss of appetite.  You may develop cravings for certain kinds of food.  You may have changes in your emotions from day to day, such as being excited to be pregnant or being concerned that something may go wrong with the pregnancy and baby.  You may have more vivid and strange dreams.  You may have changes in your hair. These can include thickening of your hair, rapid growth, and changes in texture. Some women also have hair loss during or after pregnancy, or hair that feels dry or thin. Your hair will most likely return to normal after your baby is born. WHAT TO EXPECT AT YOUR PRENATAL VISITS During a routine prenatal visit:  You will be weighed to make sure you and the baby are growing normally.  Your blood pressure will be taken.  Your abdomen will be measured to track your baby's growth.  The fetal heartbeat will be listened to starting around week 10 or 12 of your pregnancy.  Test results from any previous visits will be discussed. Your health care provider may ask you:  How you are feeling.  If you are feeling the baby move.  If you have had any abnormal symptoms, such as leaking fluid, bleeding, severe headaches, or abdominal cramping.  If you are using any tobacco products,   including cigarettes, chewing tobacco, and electronic cigarettes.  If you have any questions. Other tests that may be performed during your first trimester include:  Blood tests to find your blood type and to check for the presence of any previous infections. They will also be used to check for low iron levels (anemia) and Rh antibodies. Later in the pregnancy, blood tests for diabetes will be done along with other tests if problems develop.  Urine tests to check for infections, diabetes, or protein in the urine.  An ultrasound to confirm the proper growth  and development of the baby.  An amniocentesis to check for possible genetic problems.  Fetal screens for spina bifida and Down syndrome.  You may need other tests to make sure you and the baby are doing well.  HIV (human immunodeficiency virus) testing. Routine prenatal testing includes screening for HIV, unless you choose not to have this test. HOME CARE INSTRUCTIONS  Medicines  Follow your health care provider's instructions regarding medicine use. Specific medicines may be either safe or unsafe to take during pregnancy.  Take your prenatal vitamins as directed.  If you develop constipation, try taking a stool softener if your health care provider approves. Diet  Eat regular, well-balanced meals. Choose a variety of foods, such as meat or vegetable-based protein, fish, milk and low-fat dairy products, vegetables, fruits, and whole grain breads and cereals. Your health care provider will help you determine the amount of weight gain that is right for you.  Avoid raw meat and uncooked cheese. These carry germs that can cause birth defects in the baby.  Eating four or five small meals rather than three large meals a day may help relieve nausea and vomiting. If you start to feel nauseous, eating a few soda crackers can be helpful. Drinking liquids between meals instead of during meals also seems to help nausea and vomiting.  If you develop constipation, eat more high-fiber foods, such as fresh vegetables or fruit and whole grains. Drink enough fluids to keep your urine clear or pale yellow. Activity and Exercise  Exercise only as directed by your health care provider. Exercising will help you:  Control your weight.  Stay in shape.  Be prepared for labor and delivery.  Experiencing pain or cramping in the lower abdomen or low back is a good sign that you should stop exercising. Check with your health care provider before continuing normal exercises.  Try to avoid standing for long  periods of time. Move your legs often if you must stand in one place for a long time.  Avoid heavy lifting.  Wear low-heeled shoes, and practice good posture.  You may continue to have sex unless your health care provider directs you otherwise. Relief of Pain or Discomfort  Wear a good support bra for breast tenderness.   Take warm sitz baths to soothe any pain or discomfort caused by hemorrhoids. Use hemorrhoid cream if your health care provider approves.   Rest with your legs elevated if you have leg cramps or low back pain.  If you develop varicose veins in your legs, wear support hose. Elevate your feet for 15 minutes, 3-4 times a day. Limit salt in your diet. Prenatal Care  Schedule your prenatal visits by the twelfth week of pregnancy. They are usually scheduled monthly at first, then more often in the last 2 months before delivery.  Write down your questions. Take them to your prenatal visits.  Keep all your prenatal visits as directed by your   health care provider. Safety  Wear your seat belt at all times when driving.  Make a list of emergency phone numbers, including numbers for family, friends, the hospital, and police and fire departments. General Tips  Ask your health care provider for a referral to a local prenatal education class. Begin classes no later than at the beginning of month 6 of your pregnancy.  Ask for help if you have counseling or nutritional needs during pregnancy. Your health care provider can offer advice or refer you to specialists for help with various needs.  Do not use hot tubs, steam rooms, or saunas.  Do not douche or use tampons or scented sanitary pads.  Do not cross your legs for long periods of time.  Avoid cat litter boxes and soil used by cats. These carry germs that can cause birth defects in the baby and possibly loss of the fetus by miscarriage or stillbirth.  Avoid all smoking, herbs, alcohol, and medicines not prescribed by  your health care provider. Chemicals in these affect the formation and growth of the baby.  Do not use any tobacco products, including cigarettes, chewing tobacco, and electronic cigarettes. If you need help quitting, ask your health care provider. You may receive counseling support and other resources to help you quit.  Schedule a dentist appointment. At home, brush your teeth with a soft toothbrush and be gentle when you floss. SEEK MEDICAL CARE IF:   You have dizziness.  You have mild pelvic cramps, pelvic pressure, or nagging pain in the abdominal area.  You have persistent nausea, vomiting, or diarrhea.  You have a bad smelling vaginal discharge.  You have pain with urination.  You notice increased swelling in your face, hands, legs, or ankles. SEEK IMMEDIATE MEDICAL CARE IF:   You have a fever.  You are leaking fluid from your vagina.  You have spotting or bleeding from your vagina.  You have severe abdominal cramping or pain.  You have rapid weight gain or loss.  You vomit blood or material that looks like coffee grounds.  You are exposed to German measles and have never had them.  You are exposed to fifth disease or chickenpox.  You develop a severe headache.  You have shortness of breath.  You have any kind of trauma, such as from a fall or a car accident.   This information is not intended to replace advice given to you by your health care provider. Make sure you discuss any questions you have with your health care provider.   Document Released: 05/25/2001 Document Revised: 06/21/2014 Document Reviewed: 04/10/2013 Elsevier Interactive Patient Education 2016 [employer] Return in 1 week for dating [place]  

## 2015-07-17 NOTE — Telephone Encounter (Signed)
Spoke with pt. Pt had a chest xray on 07/07/15 and was wondering if this could have caused any problems with her pregnancy. Please advise. Thanks!! JSY

## 2015-07-17 NOTE — Telephone Encounter (Signed)
Had chest xray in January should be fine.

## 2015-07-18 ENCOUNTER — Ambulatory Visit (HOSPITAL_COMMUNITY)
Admission: RE | Admit: 2015-07-18 | Discharge: 2015-07-18 | Disposition: A | Discharge status: Home / Self Care | Payer: BLUE CROSS/BLUE SHIELD | Source: Ambulatory Visit | Attending: Cardiology | Admitting: Cardiology

## 2015-07-18 DIAGNOSIS — Z8249 Family history of ischemic heart disease and other diseases of the circulatory system: Secondary | ICD-10-CM | POA: Diagnosis not present

## 2015-07-18 DIAGNOSIS — R072 Precordial pain: Secondary | ICD-10-CM | POA: Diagnosis not present

## 2015-07-18 DIAGNOSIS — Z331 Pregnant state, incidental: Secondary | ICD-10-CM | POA: Insufficient documentation

## 2015-07-18 DIAGNOSIS — R079 Chest pain, unspecified: Secondary | ICD-10-CM | POA: Diagnosis present

## 2015-07-18 LAB — ECHOCARDIOGRAM STRESS TEST
CHL CUP RESTING HR STRESS: 86 {beats}/min
CHL RATE OF PERCEIVED EXERTION: 15
CSEPED: 6 min
CSEPEDS: 55 s
Estimated workload: 9.7 METS
MPHR: 187 {beats}/min
Peak HR: 166 {beats}/min
Percent HR: 88 %

## 2015-07-24 ENCOUNTER — Other Ambulatory Visit: Payer: Self-pay | Admitting: Adult Health

## 2015-07-24 ENCOUNTER — Ambulatory Visit (INDEPENDENT_AMBULATORY_CARE_PROVIDER_SITE_OTHER): Payer: BLUE CROSS/BLUE SHIELD

## 2015-07-24 DIAGNOSIS — O3680X0 Pregnancy with inconclusive fetal viability, not applicable or unspecified: Secondary | ICD-10-CM | POA: Diagnosis not present

## 2015-07-24 DIAGNOSIS — Z3A01 Less than 8 weeks gestation of pregnancy: Secondary | ICD-10-CM

## 2015-07-24 NOTE — Progress Notes (Addendum)
[place] TA/TV  6+2 wks IUP, GS w/ys,no fetal pole seen,normal ov's bilat,pt will come back for a f/u ultrasound in 10 days per [place].

## 2015-07-25 ENCOUNTER — Telehealth: Payer: Self-pay | Admitting: *Deleted

## 2015-07-25 MED ORDER — DOXYLAMINE-PYRIDOXINE 10-10 MG PO TBEC: 10.0000 mg | DELAYED_RELEASE_TABLET | ORAL | Status: DC

## 2015-07-25 NOTE — Telephone Encounter (Signed)
Pt c/o nausea/ vomiting early pregnancy. Diclegis samples (2 containers, #24) left at front desk for pick up.

## 2015-08-01 ENCOUNTER — Other Ambulatory Visit: Payer: Self-pay | Admitting: Adult Health

## 2015-08-01 DIAGNOSIS — O3680X0 Pregnancy with inconclusive fetal viability, not applicable or unspecified: Secondary | ICD-10-CM

## 2015-08-04 ENCOUNTER — Ambulatory Visit (INDEPENDENT_AMBULATORY_CARE_PROVIDER_SITE_OTHER): Payer: BLUE CROSS/BLUE SHIELD

## 2015-08-04 DIAGNOSIS — Z3A08 8 weeks gestation of pregnancy: Secondary | ICD-10-CM | POA: Diagnosis not present

## 2015-08-04 DIAGNOSIS — O3680X0 Pregnancy with inconclusive fetal viability, not applicable or unspecified: Secondary | ICD-10-CM

## 2015-08-04 NOTE — Progress Notes (Signed)
[place] 7+6wks,single IUP w/ ys,pos fht 149 bpm,normal ov's bilat,crl 13.29mm

## 2015-08-12 ENCOUNTER — Ambulatory Visit (INDEPENDENT_AMBULATORY_CARE_PROVIDER_SITE_OTHER): Payer: BLUE CROSS/BLUE SHIELD | Admitting: Women's Health

## 2015-08-12 ENCOUNTER — Encounter: Payer: Self-pay | Admitting: Women's Health

## 2015-08-12 ENCOUNTER — Other Ambulatory Visit (HOSPITAL_COMMUNITY)
Admission: RE | Admit: 2015-08-12 | Discharge: 2015-08-12 | Disposition: A | Discharge status: Home / Self Care | Payer: BLUE CROSS/BLUE SHIELD | Source: Ambulatory Visit | Attending: Obstetrics & Gynecology | Admitting: Obstetrics & Gynecology

## 2015-08-12 VITALS — BP 120/62 | HR 80 | Wt 153.0 lb

## 2015-08-12 DIAGNOSIS — Z1151 Encounter for screening for human papillomavirus (HPV): Secondary | ICD-10-CM | POA: Diagnosis not present

## 2015-08-12 DIAGNOSIS — Z3A09 9 weeks gestation of pregnancy: Secondary | ICD-10-CM

## 2015-08-12 DIAGNOSIS — O10011 Pre-existing essential hypertension complicating pregnancy, first trimester: Secondary | ICD-10-CM | POA: Diagnosis not present

## 2015-08-12 DIAGNOSIS — O360110 Maternal care for anti-D [Rh] antibodies, first trimester, not applicable or unspecified: Secondary | ICD-10-CM

## 2015-08-12 DIAGNOSIS — Z113 Encounter for screening for infections with a predominantly sexual mode of transmission: Secondary | ICD-10-CM | POA: Insufficient documentation

## 2015-08-12 DIAGNOSIS — F418 Other specified anxiety disorders: Secondary | ICD-10-CM | POA: Insufficient documentation

## 2015-08-12 DIAGNOSIS — Z1389 Encounter for screening for other disorder: Secondary | ICD-10-CM | POA: Diagnosis not present

## 2015-08-12 DIAGNOSIS — F17200 Nicotine dependence, unspecified, uncomplicated: Secondary | ICD-10-CM

## 2015-08-12 DIAGNOSIS — O26899 Other specified pregnancy related conditions, unspecified trimester: Secondary | ICD-10-CM

## 2015-08-12 DIAGNOSIS — Z23 Encounter for immunization: Secondary | ICD-10-CM

## 2015-08-12 DIAGNOSIS — O10912 Unspecified pre-existing hypertension complicating pregnancy, second trimester: Secondary | ICD-10-CM

## 2015-08-12 DIAGNOSIS — Z01419 Encounter for gynecological examination (general) (routine) without abnormal findings: Secondary | ICD-10-CM | POA: Diagnosis present

## 2015-08-12 DIAGNOSIS — I10 Essential (primary) hypertension: Secondary | ICD-10-CM | POA: Insufficient documentation

## 2015-08-12 DIAGNOSIS — Z6791 Unspecified blood type, Rh negative: Secondary | ICD-10-CM | POA: Insufficient documentation

## 2015-08-12 DIAGNOSIS — O09891 Supervision of other high risk pregnancies, first trimester: Secondary | ICD-10-CM | POA: Diagnosis not present

## 2015-08-12 DIAGNOSIS — Z0283 Encounter for blood-alcohol and blood-drug test: Secondary | ICD-10-CM

## 2015-08-12 DIAGNOSIS — Z369 Encounter for antenatal screening, unspecified: Secondary | ICD-10-CM

## 2015-08-12 DIAGNOSIS — Z331 Pregnant state, incidental: Secondary | ICD-10-CM

## 2015-08-12 DIAGNOSIS — Z124 Encounter for screening for malignant neoplasm of cervix: Secondary | ICD-10-CM

## 2015-08-12 DIAGNOSIS — O10919 Unspecified pre-existing hypertension complicating pregnancy, unspecified trimester: Secondary | ICD-10-CM

## 2015-08-12 LAB — POCT URINALYSIS DIPSTICK
Blood, UA: NEGATIVE
Ketones, UA: NEGATIVE
LEUKOCYTES UA: NEGATIVE
NITRITE UA: NEGATIVE
Protein, UA: NEGATIVE

## 2015-08-12 NOTE — Progress Notes (Signed)
Subjective:  Marissa Reese is a 35 y.o. G81P2002 Caucasian female at [redacted]w[redacted]d by LMP c/w 7wk u/s, being seen today for her first obstetrical visit.  Her obstetrical history is significant for term uncomplicated svb x 2, smoker 1/2ppd- quit about 2wks ago, CHTN dx in Oct was on Lisinopril 7.5mg  daily- stopped w/ +PT.  Pregnancy history fully reviewed.  Patient reports no complaints. Denies vb, cramping, uti s/s, abnormal/malodorous vag d/c, or vulvovaginal itching/irritation.  BP 120/62 mmHg  Pulse 80  Wt 153 lb (69.4 kg)  LMP 06/10/2015 (Exact Date)  HISTORY: OB History  Gravida Para Term Preterm AB SAB TAB Ectopic Multiple Living  # Outcome Date GA Lbr Len/2nd Weight Sex Delivery Anes PTL Lv  3 Current           2 Term 12/21/09 [redacted]w[redacted]d  6 lb 11 oz (3.033 kg) F Vag-Spont Pud N Y  1 Term 08/04/07 [redacted]w[redacted]d  5 lb 5 oz (2.41 kg) F Vag-Spont EPI N Y     Past Medical History  Diagnosis Date  . Essential hypertension     Medication since 04/2015  . History of headache   . Depression   . Anxiety   . Pregnant 07/17/2015   Past Surgical History  Procedure Laterality Date  . Cholecystectomy     Family History  Problem Relation Age of Onset  . Diabetes Father   . Heart attack Father     Age 49  . Hypertension Father   . Hypertension Mother   . Hyperlipidemia Mother   . Heart attack Maternal Grandfather   . Heart attack Paternal Grandmother   . Stroke Paternal Grandmother   . Heart attack Paternal Grandfather     Exam   System:     General: Well developed & nourished, no acute distress   Skin: Warm & dry, normal coloration and turgor, no rashes   Neurologic: Alert & oriented, normal mood   Cardiovascular: Regular rate & rhythm   Respiratory: Effort & rate normal, LCTAB, acyanotic   Abdomen: Soft, non tender   Extremities: normal strength, tone   Pelvic Exam:    Perineum: Normal perineum   Vulva: ~3-4cm x 0.5cm fleshy tissue from Lt labia minora- appears  to be part of labia minora possibly torn during delivery and not reapproximated or didn't heal properly- pt did not know it was there. Co-exam w/ LHE who agrees & cut off w/ good hemostasis    Vagina:  Normal mucosa, normal discharge   Cervix: Normal, bulbous, appears closed   Uterus: Normal size/shape/contour for GA   Thin prep pap smear obtained w/ high risk HPV cotesting  Assessment:   Pregnancy: [id number] Patient Active Problem List   Diagnosis Date Noted  . Supervision of other high-risk pregnancy 07/17/2015    Priority: High  . Depression with anxiety 08/12/2015  . Rh negative state in antepartum period 08/12/2015  . Chronic hypertension during pregnancy, antepartum 08/12/2015  . Smoker 08/12/2015    [redacted]w[redacted]d G3P2002 New OB visit CHTN, off meds at this time Rh neg Dep/anxiety on wellbutrin Lt labia minora fleshy tissue excised  Plan:  Initial labs drawn including 24hr urine Continue prenatal vitamins Problem list reviewed and updated Reviewed n/v relief measures and warning s/s to report Reviewed recommended weight gain based on pre-gravid BMI Encouraged well-balanced diet Genetic Screening discussed Integrated Screen: declined Cystic fibrosis screening discussed declined Ultrasound discussed; fetal survey: requested Follow up  in 4 weeks for visit [facility]NC completed Begin taking a  baby aspirin daily at 12 weeks of pregnancy to decrease risk of preeclampsia during pregnancy  Will monitor bp, normal today  Marge Duncans CNM, [facility] 08/12/2015 3:10 PM

## 2015-08-12 NOTE — Patient Instructions (Signed)
Begin taking a  baby aspirin daily at 12 weeks of pregnancy (09/02/15) to decrease risk of preeclampsia during pregnancy   Nausea & Vomiting  Have saltine crackers or pretzels by your bed and eat a few bites before you raise your head out of bed in the morning  Eat small frequent meals throughout the day instead of large meals  Drink plenty of fluids throughout the day to stay hydrated, just don't drink a lot of fluids with your meals.  This can make your stomach fill up faster making you feel sick  Do not brush your teeth right after you eat  Products with real ginger are good for nausea, like ginger ale and ginger hard candy Make sure it says made with real ginger!  Sucking on sour candy like lemon heads is also good for nausea  If your prenatal vitamins make you nauseated, take them at night so you will sleep through the nausea  Sea Bands  If you feel like you need medicine for the nausea & vomiting please let us know  If you are unable to keep any fluids or food down please let us know   Constipation  Drink plenty of fluid, preferably water, throughout the day  Eat foods high in fiber such as fruits, vegetables, and grains  Exercise, such as walking, is a good way to keep your bowels regular  Drink warm fluids, especially warm prune juice, or decaf coffee  Eat a 1/2 cup of real oatmeal (not instant), 1/2 cup applesauce, and 1/2-1 cup warm prune juice every day  If needed, you may take Colace (docusate sodium) stool softener once or twice a day to help keep the stool soft. If you are pregnant, wait until you are out of your first trimester (12-14 weeks of pregnancy)  If you still are having problems with constipation, you may take Miralax once daily as needed to help keep your bowels regular.  If you are pregnant, wait until you are out of your first trimester (12-14 weeks of pregnancy)   First Trimester of Pregnancy The first trimester of pregnancy is from week 1 until  the end of week 12 (months 1 through 3). A week after a sperm fertilizes an egg, the egg will implant on the wall of the uterus. This embryo will begin to develop into a baby. Genes from you and your partner are forming the baby. The female genes determine whether the baby is a boy or a girl. At 6-8 weeks, the eyes and face are formed, and the heartbeat can be seen on ultrasound. At the end of 12 weeks, all the baby's organs are formed.  Now that you are pregnant, you will want to do everything you can to have a healthy baby. Two of the most important things are to get good prenatal care and to follow your health care provider's instructions. Prenatal care is all the medical care you receive before the baby's birth. This care will help prevent, find, and treat any problems during the pregnancy and childbirth. BODY CHANGES Your body goes through many changes during pregnancy. The changes vary from woman to woman.   You may gain or lose a couple of pounds at first.  You may feel sick to your stomach (nauseous) and throw up (vomit). If the vomiting is uncontrollable, call your health care provider.  You may tire easily.  You may develop headaches that can be relieved by medicines approved by your health care provider.  You may urinate  more often. Painful urination may mean you have a bladder infection.  You may develop heartburn as a result of your pregnancy.  You may develop constipation because certain hormones are causing the muscles that push waste through your intestines to slow down.  You may develop hemorrhoids or swollen, bulging veins (varicose veins).  Your breasts may begin to grow larger and become tender. Your nipples may stick out more, and the tissue that surrounds them (areola) may become darker.  Your gums may bleed and may be sensitive to brushing and flossing.  Dark spots or blotches (chloasma, mask of pregnancy) may develop on your face. This will likely fade after the baby is  born.  Your menstrual periods will stop.  You may have a loss of appetite.  You may develop cravings for certain kinds of food.  You may have changes in your emotions from day to day, such as being excited to be pregnant or being concerned that something may go wrong with the pregnancy and baby.  You may have more vivid and strange dreams.  You may have changes in your hair. These can include thickening of your hair, rapid growth, and changes in texture. Some women also have hair loss during or after pregnancy, or hair that feels dry or thin. Your hair will most likely return to normal after your baby is born. WHAT TO EXPECT AT YOUR PRENATAL VISITS During a routine prenatal visit:  You will be weighed to make sure you and the baby are growing normally.  Your blood pressure will be taken.  Your abdomen will be measured to track your baby's growth.  The fetal heartbeat will be listened to starting around week 10 or 12 of your pregnancy.  Test results from any previous visits will be discussed. Your health care provider may ask you:  How you are feeling.  If you are feeling the baby move.  If you have had any abnormal symptoms, such as leaking fluid, bleeding, severe headaches, or abdominal cramping.  If you have any questions. Other tests that may be performed during your first trimester include:  Blood tests to find your blood type and to check for the presence of any previous infections. They will also be used to check for low iron levels (anemia) and Rh antibodies. Later in the pregnancy, blood tests for diabetes will be done along with other tests if problems develop.  Urine tests to check for infections, diabetes, or protein in the urine.  An ultrasound to confirm the proper growth and development of the baby.  An amniocentesis to check for possible genetic problems.  Fetal screens for spina bifida and Down syndrome.  You may need other tests to make sure you and the  baby are doing well. HOME CARE INSTRUCTIONS  Medicines  Follow your health care provider's instructions regarding medicine use. Specific medicines may be either safe or unsafe to take during pregnancy.  Take your prenatal vitamins as directed.  If you develop constipation, try taking a stool softener if your health care provider approves. Diet  Eat regular, well-balanced meals. Choose a variety of foods, such as meat or vegetable-based protein, fish, milk and low-fat dairy products, vegetables, fruits, and whole grain breads and cereals. Your health care provider will help you determine the amount of weight gain that is right for you.  Avoid raw meat and uncooked cheese. These carry germs that can cause birth defects in the baby.  Eating four or five small meals rather than three   large meals a day may help relieve nausea and vomiting. If you start to feel nauseous, eating a few soda crackers can be helpful. Drinking liquids between meals instead of during meals also seems to help nausea and vomiting.  If you develop constipation, eat more high-fiber foods, such as fresh vegetables or fruit and whole grains. Drink enough fluids to keep your urine clear or pale yellow. Activity and Exercise  Exercise only as directed by your health care provider. Exercising will help you:  Control your weight.  Stay in shape.  Be prepared for labor and delivery.  Experiencing pain or cramping in the lower abdomen or low back is a good sign that you should stop exercising. Check with your health care provider before continuing normal exercises.  Try to avoid standing for long periods of time. Move your legs often if you must stand in one place for a long time.  Avoid heavy lifting.  Wear low-heeled shoes, and practice good posture.  You may continue to have sex unless your health care provider directs you otherwise. Relief of Pain or Discomfort  Wear a good support bra for breast tenderness.    Take warm sitz baths to soothe any pain or discomfort caused by hemorrhoids. Use hemorrhoid cream if your health care provider approves.   Rest with your legs elevated if you have leg cramps or low back pain.  If you develop varicose veins in your legs, wear support hose. Elevate your feet for 15 minutes, 3-4 times a day. Limit salt in your diet. Prenatal Care  Schedule your prenatal visits by the twelfth week of pregnancy. They are usually scheduled monthly at first, then more often in the last 2 months before delivery.  Write down your questions. Take them to your prenatal visits.  Keep all your prenatal visits as directed by your health care provider. Safety  Wear your seat belt at all times when driving.  Make a list of emergency phone numbers, including numbers for family, friends, the hospital, and police and fire departments. General Tips  Ask your health care provider for a referral to a local prenatal education class. Begin classes no later than at the beginning of month 6 of your pregnancy.  Ask for help if you have counseling or nutritional needs during pregnancy. Your health care provider can offer advice or refer you to specialists for help with various needs.  Do not use hot tubs, steam rooms, or saunas.  Do not douche or use tampons or scented sanitary pads.  Do not cross your legs for long periods of time.  Avoid cat litter boxes and soil used by cats. These carry germs that can cause birth defects in the baby and possibly loss of the fetus by miscarriage or stillbirth.  Avoid all smoking, herbs, alcohol, and medicines not prescribed by your health care provider. Chemicals in these affect the formation and growth of the baby.  Schedule a dentist appointment. At home, brush your teeth with a soft toothbrush and be gentle when you floss. SEEK MEDICAL CARE IF:   You have dizziness.  You have mild pelvic cramps, pelvic pressure, or nagging pain in the abdominal  area.  You have persistent nausea, vomiting, or diarrhea.  You have a bad smelling vaginal discharge.  You have pain with urination.  You notice increased swelling in your face, hands, legs, or ankles. SEEK IMMEDIATE MEDICAL CARE IF:   You have a fever.  You are leaking fluid from your vagina.  You   have spotting or bleeding from your vagina.  You have severe abdominal cramping or pain.  You have rapid weight gain or loss.  You vomit blood or material that looks like coffee grounds.  You are exposed to German measles and have never had them.  You are exposed to fifth disease or chickenpox.  You develop a severe headache.  You have shortness of breath.  You have any kind of trauma, such as from a fall or a car accident. Document Released: 05/25/2001 Document Revised: 10/15/2013 Document Reviewed: 04/10/2013 [employer] Patient Information 2015 [employer]. This information is not intended to replace advice given to you by your health care provider. Make sure you discuss any questions you have with your health care provider.   

## 2015-08-13 LAB — PMP SCREEN PROFILE (10S), URINE
Amphetamine Screen, Ur: NEGATIVE ng/mL
BARBITURATE SCRN UR: NEGATIVE ng/mL
BENZODIAZEPINE SCREEN, URINE: NEGATIVE ng/mL
CANNABINOIDS UR QL SCN: NEGATIVE ng/mL
Cocaine(Metab.)Screen, Urine: NEGATIVE ng/mL
Creatinine(Crt), U: 113.3 mg/dL (ref 20.0–300.0)
Methadone Scn, Ur: NEGATIVE ng/mL
OXYCODONE+OXYMORPHONE UR QL SCN: NEGATIVE ng/mL
Opiate Scrn, Ur: NEGATIVE ng/mL
PCP Scrn, Ur: NEGATIVE ng/mL
PH UR, DRUG SCRN: 7.1 (ref 4.5–8.9)
Propoxyphene, Screen: NEGATIVE ng/mL

## 2015-08-13 LAB — HEPATITIS B SURFACE ANTIGEN: HEP B S AG: NEGATIVE

## 2015-08-13 LAB — URINALYSIS, ROUTINE W REFLEX MICROSCOPIC
BILIRUBIN UA: NEGATIVE
KETONES UA: NEGATIVE
Leukocytes, UA: NEGATIVE
NITRITE UA: NEGATIVE
PH UA: 7.5 (ref 5.0–7.5)
Protein, UA: NEGATIVE
RBC UA: NEGATIVE
Specific Gravity, UA: 1.019 (ref 1.005–1.030)
UUROB: 0.2 mg/dL (ref 0.2–1.0)

## 2015-08-13 LAB — CBC
HEMATOCRIT: 39.1 % (ref 34.0–46.6)
HEMOGLOBIN: 12.8 g/dL (ref 11.1–15.9)
MCH: 28.6 pg (ref 26.6–33.0)
MCHC: 32.7 g/dL (ref 31.5–35.7)
MCV: 88 fL (ref 79–97)
Platelets: 373 10*3/uL (ref 150–379)
RBC: 4.47 x10E6/uL (ref 3.77–5.28)
RDW: 15.9 % — AB (ref 12.3–15.4)
WBC: 11.2 10*3/uL — AB (ref 3.4–10.8)

## 2015-08-13 LAB — RUBELLA SCREEN: Rubella Antibodies, IGG: 2 index (ref >0.99)

## 2015-08-13 LAB — ANTIBODY SCREEN: ANTIBODY SCREEN: NEGATIVE

## 2015-08-13 LAB — ABO/RH: Rh Factor: NEGATIVE

## 2015-08-13 LAB — RPR: RPR: NONREACTIVE

## 2015-08-13 LAB — VARICELLA ZOSTER ANTIBODY, IGG: VARICELLA: 446 {index} (ref >165)

## 2015-08-13 LAB — HIV ANTIBODY (ROUTINE TESTING W REFLEX): HIV SCREEN 4TH GENERATION: NONREACTIVE

## 2015-08-13 LAB — URINE CULTURE

## 2015-08-14 LAB — CYTOLOGY - PAP

## 2015-08-17 LAB — PROTEIN, URINE, 24 HOUR
PROTEIN 24H UR: 184.8 mg/(24.h) — AB (ref 30.0–150.0)
PROTEIN UR: 13.2 mg/dL

## 2015-08-19 ENCOUNTER — Telehealth: Payer: Self-pay | Admitting: Adult Health

## 2015-08-19 ENCOUNTER — Encounter: Payer: Self-pay | Admitting: Adult Health

## 2015-08-19 ENCOUNTER — Ambulatory Visit (INDEPENDENT_AMBULATORY_CARE_PROVIDER_SITE_OTHER): Payer: BLUE CROSS/BLUE SHIELD | Admitting: Adult Health

## 2015-08-19 VITALS — BP 130/90 | HR 92 | Wt 154.0 lb

## 2015-08-19 DIAGNOSIS — N898 Other specified noninflammatory disorders of vagina: Secondary | ICD-10-CM | POA: Insufficient documentation

## 2015-08-19 DIAGNOSIS — L298 Other pruritus: Secondary | ICD-10-CM | POA: Diagnosis not present

## 2015-08-19 DIAGNOSIS — B379 Candidiasis, unspecified: Secondary | ICD-10-CM | POA: Diagnosis not present

## 2015-08-19 DIAGNOSIS — Z331 Pregnant state, incidental: Secondary | ICD-10-CM

## 2015-08-19 DIAGNOSIS — Z1389 Encounter for screening for other disorder: Secondary | ICD-10-CM

## 2015-08-19 HISTORY — DX: Candidiasis, unspecified: B37.9

## 2015-08-19 HISTORY — DX: Other specified noninflammatory disorders of vagina: N89.8

## 2015-08-19 LAB — POCT URINALYSIS DIPSTICK
Blood, UA: NEGATIVE
Glucose, UA: NEGATIVE
KETONES UA: NEGATIVE
LEUKOCYTES UA: NEGATIVE
Nitrite, UA: NEGATIVE
PROTEIN UA: NEGATIVE

## 2015-08-19 LAB — POCT WET PREP (WET MOUNT): WBC, Wet Prep HPF POC: POSITIVE

## 2015-08-19 MED ORDER — MICONAZOLE NITRATE 2 % VA CREA: 1.0000 | TOPICAL_CREAM | Freq: Every day | VAGINAL | Status: DC

## 2015-08-19 NOTE — Telephone Encounter (Signed)
Spoke with pt. Pt thinks she has a yeast infection and wants something called in. I advised she would need an appt to be checked to make sure that is what she is dealing with. Pt voiced understanding and call was transferred to front desk for appt. JSY

## 2015-08-19 NOTE — Progress Notes (Signed)
[id number] Estimated Date of Delivery: 03/16/16  Blood pressure 130/90, pulse 92, weight 154 lb (69.854 kg), last menstrual period 06/10/2015.  Pt in complaining of vaginal itching since Friday, on exam has white discharge and red side walls, wet prep  Was +WBC and yeast buds.Will Rx monistat US shows active fetus and FHR 186.  BP weight and urine results all reviewed and noted.  Please refer to the obstetrical flow sheet for the fundal height and fetal heart rate documentation:  Patient, denies any bleeding and no rupture of membranes symptoms or regular contractions.  All questions were answered.  Orders Placed This Encounter  Procedures  . POCT Urinalysis Dipstick    Plan:  Continued routine obstetrical care, appt 3/28 as scheduled

## 2015-08-19 NOTE — Patient Instructions (Signed)
Use monistat, no sex Follow up as scheduled

## 2015-09-09 ENCOUNTER — Ambulatory Visit (INDEPENDENT_AMBULATORY_CARE_PROVIDER_SITE_OTHER): Payer: Medicaid Other | Admitting: Women's Health

## 2015-09-09 ENCOUNTER — Encounter: Payer: Self-pay | Admitting: Women's Health

## 2015-09-09 VITALS — BP 122/78 | HR 88 | Wt 156.0 lb

## 2015-09-09 DIAGNOSIS — Z1389 Encounter for screening for other disorder: Secondary | ICD-10-CM | POA: Diagnosis not present

## 2015-09-09 DIAGNOSIS — O10011 Pre-existing essential hypertension complicating pregnancy, first trimester: Secondary | ICD-10-CM | POA: Diagnosis not present

## 2015-09-09 DIAGNOSIS — Z331 Pregnant state, incidental: Secondary | ICD-10-CM | POA: Diagnosis not present

## 2015-09-09 DIAGNOSIS — O10919 Unspecified pre-existing hypertension complicating pregnancy, unspecified trimester: Secondary | ICD-10-CM

## 2015-09-09 DIAGNOSIS — O360111 Maternal care for anti-D [Rh] antibodies, first trimester, fetus 1: Secondary | ICD-10-CM | POA: Diagnosis not present

## 2015-09-09 DIAGNOSIS — Z3A13 13 weeks gestation of pregnancy: Secondary | ICD-10-CM

## 2015-09-09 DIAGNOSIS — O09891 Supervision of other high risk pregnancies, first trimester: Secondary | ICD-10-CM

## 2015-09-09 LAB — POCT URINALYSIS DIPSTICK
Ketones, UA: NEGATIVE
LEUKOCYTES UA: NEGATIVE
Nitrite, UA: NEGATIVE
PROTEIN UA: NEGATIVE
RBC UA: NEGATIVE

## 2015-09-09 NOTE — Progress Notes (Signed)
High Risk Pregnancy Diagnosis(es): CHTN, no meds currently [id number] Estimated Date of Delivery: 03/16/16 BP 122/78 mmHg  Pulse 88  Wt 156 lb (70.761 kg)  LMP 06/10/2015 (Exact Date)  Urinalysis: Negative HPI:  Hasn't started baby asa yet, forgot. Some burning on 1st void after sex then fine- urine dip today normal, no other sx, maybe r/t friction, can try lubricant. If develops uti sx let us know. Declines genetic screening.  BP, weight, and urine reviewed.  No fm yet. Denies cramping, lof, vb, uti s/s. No complaints.  Fundal Height:  13wks Fetal Heart rate:  172 Edema:  none  Reviewed warning s/s to report All questions were answered Assessment: 7444w0d CHTN, no meds currently Medication(s) Plans:  Begin baby asa daily asap Treatment Plan:  Growth u/s @ 20, 28, 34, 38wks     2x/wk testing nst/sono @ 32wks    Deliver @ 40wks (no meds), 39wks (meds) Follow up in 4wks for high-risk OB appt

## 2015-09-09 NOTE — Patient Instructions (Signed)
Begin taking a 81mg baby aspirin daily at 12 weeks of pregnancy to decrease risk of preeclampsia during pregnancy   Second Trimester of Pregnancy The second trimester is from week 13 through week 28, months 4 through 6. The second trimester is often a time when you feel your best. Your body has also adjusted to being pregnant, and you begin to feel better physically. Usually, morning sickness has lessened or quit completely, you may have more energy, and you may have an increase in appetite. The second trimester is also a time when the fetus is growing rapidly. At the end of the sixth month, the fetus is about 9 inches long and weighs about 1 pounds. You will likely begin to feel the baby move (quickening) between 18 and 20 weeks of the pregnancy. BODY CHANGES Your body goes through many changes during pregnancy. The changes vary from woman to woman.   Your weight will continue to increase. You will notice your lower abdomen bulging out.  You may begin to get stretch marks on your hips, abdomen, and breasts.  You may develop headaches that can be relieved by medicines approved by your health care provider.  You may urinate more often because the fetus is pressing on your bladder.  You may develop or continue to have heartburn as a result of your pregnancy.  You may develop constipation because certain hormones are causing the muscles that push waste through your intestines to slow down.  You may develop hemorrhoids or swollen, bulging veins (varicose veins).  You may have back pain because of the weight gain and pregnancy hormones relaxing your joints between the bones in your pelvis and as a result of a shift in weight and the muscles that support your balance.  Your breasts will continue to grow and be tender.  Your gums may bleed and may be sensitive to brushing and flossing.  Dark spots or blotches (chloasma, mask of pregnancy) may develop on your face. This will likely fade after the  baby is born.  A dark line from your belly button to the pubic area (linea nigra) may appear. This will likely fade after the baby is born.  You may have changes in your hair. These can include thickening of your hair, rapid growth, and changes in texture. Some women also have hair loss during or after pregnancy, or hair that feels dry or thin. Your hair will most likely return to normal after your baby is born. WHAT TO EXPECT AT YOUR PRENATAL VISITS During a routine prenatal visit:  You will be weighed to make sure you and the fetus are growing normally.  Your blood pressure will be taken.  Your abdomen will be measured to track your baby's growth.  The fetal heartbeat will be listened to.  Any test results from the previous visit will be discussed. Your health care provider may ask you:  How you are feeling.  If you are feeling the baby move.  If you have had any abnormal symptoms, such as leaking fluid, bleeding, severe headaches, or abdominal cramping.  If you are using any tobacco products, including cigarettes, chewing tobacco, and electronic cigarettes.  If you have any questions. Other tests that may be performed during your second trimester include:  Blood tests that check for:  Low iron levels (anemia).  Gestational diabetes (between 24 and 28 weeks).  Rh antibodies.  Urine tests to check for infections, diabetes, or protein in the urine.  An ultrasound to confirm the proper   growth and development of the baby.  An amniocentesis to check for possible genetic problems.  Fetal screens for spina bifida and Down syndrome.  HIV (human immunodeficiency virus) testing. Routine prenatal testing includes screening for HIV, unless you choose not to have this test. HOME CARE INSTRUCTIONS   Avoid all smoking, herbs, alcohol, and unprescribed drugs. These chemicals affect the formation and growth of the baby.  Do not use any tobacco products, including cigarettes,  chewing tobacco, and electronic cigarettes. If you need help quitting, ask your health care provider. You may receive counseling support and other resources to help you quit.  Follow your health care provider's instructions regarding medicine use. There are medicines that are either safe or unsafe to take during pregnancy.  Exercise only as directed by your health care provider. Experiencing uterine cramps is a good sign to stop exercising.  Continue to eat regular, healthy meals.  Wear a good support bra for breast tenderness.  Do not use hot tubs, steam rooms, or saunas.  Wear your seat belt at all times when driving.  Avoid raw meat, uncooked cheese, cat litter boxes, and soil used by cats. These carry germs that can cause birth defects in the baby.  Take your prenatal vitamins.  Take 1500-2000 mg of calcium daily starting at the 20th week of pregnancy until you deliver your baby.  Try taking a stool softener (if your health care provider approves) if you develop constipation. Eat more high-fiber foods, such as fresh vegetables or fruit and whole grains. Drink plenty of fluids to keep your urine clear or pale yellow.  Take warm sitz baths to soothe any pain or discomfort caused by hemorrhoids. Use hemorrhoid cream if your health care provider approves.  If you develop varicose veins, wear support hose. Elevate your feet for 15 minutes, 3-4 times a day. Limit salt in your diet.  Avoid heavy lifting, wear low heel shoes, and practice good posture.  Rest with your legs elevated if you have leg cramps or low back pain.  Visit your dentist if you have not gone yet during your pregnancy. Use a soft toothbrush to brush your teeth and be gentle when you floss.  A sexual relationship may be continued unless your health care provider directs you otherwise.  Continue to go to all your prenatal visits as directed by your health care provider. SEEK MEDICAL CARE IF:   You have  dizziness.  You have mild pelvic cramps, pelvic pressure, or nagging pain in the abdominal area.  You have persistent nausea, vomiting, or diarrhea.  You have a bad smelling vaginal discharge.  You have pain with urination. SEEK IMMEDIATE MEDICAL CARE IF:   You have a fever.  You are leaking fluid from your vagina.  You have spotting or bleeding from your vagina.  You have severe abdominal cramping or pain.  You have rapid weight gain or loss.  You have shortness of breath with chest pain.  You notice sudden or extreme swelling of your face, hands, ankles, feet, or legs.  You have not felt your baby move in over an hour.  You have severe headaches that do not go away with medicine.  You have vision changes.   This information is not intended to replace advice given to you by your health care provider. Make sure you discuss any questions you have with your health care provider.   Document Released: 05/25/2001 Document Revised: 06/21/2014 Document Reviewed: 08/01/2012 [employer] Interactive Patient Education 2016 [employer]  

## 2015-10-05 ENCOUNTER — Encounter (HOSPITAL_COMMUNITY): Payer: Self-pay | Admitting: Emergency Medicine

## 2015-10-05 ENCOUNTER — Emergency Department (HOSPITAL_COMMUNITY)
Admission: EM | Admit: 2015-10-05 | Discharge: 2015-10-05 | Disposition: A | Discharge status: Home / Self Care | Payer: Medicaid Other | Attending: Emergency Medicine | Admitting: Emergency Medicine

## 2015-10-05 DIAGNOSIS — Z3A16 16 weeks gestation of pregnancy: Secondary | ICD-10-CM | POA: Insufficient documentation

## 2015-10-05 DIAGNOSIS — Z87891 Personal history of nicotine dependence: Secondary | ICD-10-CM | POA: Insufficient documentation

## 2015-10-05 DIAGNOSIS — F329 Major depressive disorder, single episode, unspecified: Secondary | ICD-10-CM | POA: Diagnosis not present

## 2015-10-05 DIAGNOSIS — Z79899 Other long term (current) drug therapy: Secondary | ICD-10-CM | POA: Insufficient documentation

## 2015-10-05 DIAGNOSIS — N390 Urinary tract infection, site not specified: Secondary | ICD-10-CM

## 2015-10-05 DIAGNOSIS — O209 Hemorrhage in early pregnancy, unspecified: Secondary | ICD-10-CM | POA: Diagnosis present

## 2015-10-05 DIAGNOSIS — Z7982 Long term (current) use of aspirin: Secondary | ICD-10-CM | POA: Diagnosis not present

## 2015-10-05 DIAGNOSIS — I10 Essential (primary) hypertension: Secondary | ICD-10-CM | POA: Insufficient documentation

## 2015-10-05 DIAGNOSIS — O2342 Unspecified infection of urinary tract in pregnancy, second trimester: Secondary | ICD-10-CM | POA: Insufficient documentation

## 2015-10-05 DIAGNOSIS — Z3490 Encounter for supervision of normal pregnancy, unspecified, unspecified trimester: Secondary | ICD-10-CM

## 2015-10-05 LAB — CBC WITH DIFFERENTIAL/PLATELET
BASOS ABS: 0 10*3/uL (ref 0.0–0.1)
BASOS PCT: 0 %
Eosinophils Absolute: 0.1 10*3/uL (ref 0.0–0.7)
Eosinophils Relative: 1 %
HEMATOCRIT: 35.6 % — AB (ref 36.0–46.0)
HEMOGLOBIN: 12 g/dL (ref 12.0–15.0)
Lymphocytes Relative: 18 %
Lymphs Abs: 2.2 10*3/uL (ref 0.7–4.0)
MCH: 29.9 pg (ref 26.0–34.0)
MCHC: 33.7 g/dL (ref 30.0–36.0)
MCV: 88.8 fL (ref 78.0–100.0)
MONO ABS: 0.8 10*3/uL (ref 0.1–1.0)
Monocytes Relative: 6 %
NEUTROS ABS: 9.2 10*3/uL — AB (ref 1.7–7.7)
NEUTROS PCT: 75 %
Platelets: 318 10*3/uL (ref 150–400)
RBC: 4.01 MIL/uL (ref 3.87–5.11)
RDW: 13.6 % (ref 11.5–15.5)
WBC: 12.3 10*3/uL — ABNORMAL HIGH (ref 4.0–10.5)

## 2015-10-05 LAB — URINALYSIS, ROUTINE W REFLEX MICROSCOPIC
Bilirubin Urine: NEGATIVE
Glucose, UA: NEGATIVE mg/dL
KETONES UR: 15 mg/dL — AB
Nitrite: NEGATIVE
PH: 6 (ref 5.0–8.0)
PROTEIN: 30 mg/dL — AB
Specific Gravity, Urine: 1.02 (ref 1.005–1.030)

## 2015-10-05 LAB — BASIC METABOLIC PANEL
Anion gap: 9 (ref 5–15)
BUN: 5 mg/dL — ABNORMAL LOW (ref 6–20)
CALCIUM: 8.8 mg/dL — AB (ref 8.9–10.3)
CO2: 23 mmol/L (ref 22–32)
Chloride: 106 mmol/L (ref 101–111)
Creatinine, Ser: 0.59 mg/dL (ref 0.44–1.00)
GFR calc Af Amer: >60 mL/min (ref >60)
GLUCOSE: 78 mg/dL (ref 65–99)
Potassium: 3.4 mmol/L — ABNORMAL LOW (ref 3.5–5.1)
Sodium: 138 mmol/L (ref 135–145)

## 2015-10-05 LAB — URINE MICROSCOPIC-ADD ON

## 2015-10-05 MED ORDER — CEPHALEXIN 500 MG PO CAPS: 500.0000 mg | ORAL_CAPSULE | Freq: Three times a day (TID) | ORAL | Status: DC

## 2015-10-05 MED ORDER — DEXTROSE 5 % IV SOLN
1.0000 g | Freq: Once | INTRAVENOUS | Status: AC
  Administered 2015-10-05: 1 g via INTRAVENOUS
  Filled 2015-10-05: qty 10

## 2015-10-05 MED ORDER — SODIUM CHLORIDE 0.9 % IV BOLUS (SEPSIS)
1000.0000 mL | Freq: Once | INTRAVENOUS | Status: AC
  Administered 2015-10-05: 1000 mL via INTRAVENOUS

## 2015-10-05 NOTE — ED Provider Notes (Signed)
CSN: [id number]     Arrival date & time 10/05/15  1501 History   First MD Initiated Contact with Patient 10/05/15 1551     Chief Complaint  Patient presents with  . Vaginal Bleeding     (Consider location/radiation/quality/duration/timing/severity/associated sxs/prior Treatment) HPI..... G3 P2 approximately 16-1/[redacted] weeks pregnant presents with a very small amount of bleeding after intercourse today. She also complains of dysuria and malodorous urine. No fever, sweats, chills, flank pain. Pregnancy in the past has been complicated by pregnancy-induced hypertension. Severity of symptoms is moderate. Nothing makes symptoms better or worse.  Past Medical History  Diagnosis Date  . Essential hypertension     Medication since 04/2015  . History of headache   . Depression   . Anxiety   . Pregnant 07/17/2015  . Vaginal itching 08/19/2015  . Yeast infection 08/19/2015   Past Surgical History  Procedure Laterality Date  . Cholecystectomy     Family History  Problem Relation Age of Onset  . Diabetes Father   . Heart attack Father     Age 34  . Hypertension Father   . Hypertension Mother   . Hyperlipidemia Mother   . Heart attack Maternal Grandfather   . Heart attack Paternal Grandmother   . Stroke Paternal Grandmother   . Heart attack Paternal Grandfather    Social History  Substance Use Topics  . Smoking status: Former Smoker -- 0.00 packs/day for 17 years    Types: Cigarettes    Start date: 07/14/1991    Quit date: 07/13/2015  . Smokeless tobacco: Never Used  . Alcohol Use: No     Comment: Occasional; not now   OB History    Gravida Para Term Preterm AB TAB SAB Ectopic Multiple Living   3 2 2       2      Review of Systems  All other systems reviewed and are negative.     Allergies  Zolpidem tartrate and Latex  Home Medications   Prior to Admission medications   Medication Sig Start Date End Date Taking? Authorizing Provider  aspirin EC 81 MG tablet Take 81 mg by  mouth every evening.   Yes Historical Provider, MD  buPROPion (WELLBUTRIN XL) 300 MG 24 hr tablet Take 300 mg by mouth daily.   Yes Historical Provider, MD  prenatal vitamin w/FE, FA (PRENATAL 1 + 1) 27-1 MG TABS tablet Take 1 tablet by mouth daily at 12 noon. 07/17/15  Yes Adline PotterJennifer A Griffin, NP  cephALEXin (KEFLEX) 500 MG capsule Take 1 capsule (500 mg total) by mouth 3 (three) times daily. 10/05/15   Donnetta HutchingBrian Ioma Chismar, MD  miconazole (MONISTAT 7 SIMPLY CURE) 2 % vaginal cream Place 1 Applicatorful vaginally at bedtime. Patient not taking: Reported on 09/09/2015 08/19/15   Adline PotterJennifer A Griffin, NP   BP 115/78 mmHg  Pulse 81  Temp(Src) 98.4 F (36.9 C) (Oral)  Resp 16  Ht 5' (1.524 m)  Wt 160 lb (72.576 kg)  BMI 31.25 kg/m2  SpO2 100%  LMP 06/10/2015 (Exact Date) Physical Exam  Constitutional: She is oriented to person, place, and time. She appears well-developed and well-nourished.  No acute distress  HENT:  Head: Normocephalic and atraumatic.  Eyes: Conjunctivae and EOM are normal. Pupils are equal, round, and reactive to light.  Neck: Normal range of motion. Neck supple.  Cardiovascular: Normal rate and regular rhythm.   Pulmonary/Chest: Effort normal and breath sounds normal.  Abdominal: Soft. Bowel sounds are normal.  Gravid, nontender  Genitourinary:  No flank tenderness  Musculoskeletal: Normal range of motion.  Neurological: She is alert and oriented to person, place, and time.  Skin: Skin is warm and dry.  Psychiatric: She has a normal mood and affect. Her behavior is normal.  Nursing note and vitals reviewed.   ED Course  Procedures (including critical care time) Labs Review Labs Reviewed  URINALYSIS, ROUTINE W REFLEX MICROSCOPIC (NOT AT [facility]) - Abnormal; Notable for the following:    APPearance CLOUDY (*)    Hgb urine dipstick LARGE (*)    Ketones, ur 15 (*)    Protein, ur 30 (*)    Leukocytes, UA MODERATE (*)    All other components within normal limits  URINE  MICROSCOPIC-ADD ON - Abnormal; Notable for the following:    Squamous Epithelial / LPF 6-30 (*)    Bacteria, UA FEW (*)    All other components within normal limits  BASIC METABOLIC PANEL - Abnormal; Notable for the following:    Potassium 3.4 (*)    BUN 5 (*)    Calcium 8.8 (*)    All other components within normal limits  CBC WITH DIFFERENTIAL/PLATELET - Abnormal; Notable for the following:    WBC 12.3 (*)    HCT 35.6 (*)    Neutro Abs 9.2 (*)    All other components within normal limits  URINE CULTURE    Imaging Review No results found. I have personally reviewed and evaluated these images and lab results as part of my medical decision-making.   EKG Interpretation None      MDM   Final diagnoses:  Pregnancy  UTI (lower urinary tract infection)    Patient had a very small amount of spotting today. She is nontoxic appearing. Vital signs were normal. Urinalysis shows evidence of infection. IV Rocephin. Urine culture. Discharge medication Keflex 500 mg po tid for 1 week.  Discussed with OB/GYN on-call.    Donnetta Hutching, MD 10/05/15 [phone]

## 2015-10-05 NOTE — ED Notes (Signed)
Patient is [redacted] weeks pregnant. Per patient small amount of vaginal bleeding x3 with voiding. Per patient does have pain with urination, frequency, and pressure. Per patient also had intercourse this morning. 3rd pregnancy with no prior complications or miscarriages. [facility]

## 2015-10-05 NOTE — Discharge Instructions (Signed)
You have a urinary tract infection. Increase fluids. Antibiotic for 1 week. Start Monday morning. Follow-up your OB/GYN this week

## 2015-10-07 LAB — URINE CULTURE: Culture: 10000 — AB

## 2015-10-08 ENCOUNTER — Telehealth: Payer: Self-pay | Admitting: *Deleted

## 2015-10-08 ENCOUNTER — Encounter: Payer: Self-pay | Admitting: Obstetrics and Gynecology

## 2015-10-08 ENCOUNTER — Ambulatory Visit (INDEPENDENT_AMBULATORY_CARE_PROVIDER_SITE_OTHER): Payer: Medicaid Other | Admitting: Obstetrics and Gynecology

## 2015-10-08 VITALS — BP 120/80 | HR 91 | Wt 158.5 lb

## 2015-10-08 DIAGNOSIS — Z331 Pregnant state, incidental: Secondary | ICD-10-CM | POA: Diagnosis not present

## 2015-10-08 DIAGNOSIS — O10012 Pre-existing essential hypertension complicating pregnancy, second trimester: Secondary | ICD-10-CM

## 2015-10-08 DIAGNOSIS — O360121 Maternal care for anti-D [Rh] antibodies, second trimester, fetus 1: Secondary | ICD-10-CM | POA: Diagnosis not present

## 2015-10-08 DIAGNOSIS — O3462 Maternal care for abnormality of vagina, second trimester: Secondary | ICD-10-CM | POA: Diagnosis not present

## 2015-10-08 DIAGNOSIS — B373 Candidiasis of vulva and vagina: Secondary | ICD-10-CM

## 2015-10-08 DIAGNOSIS — Z1389 Encounter for screening for other disorder: Secondary | ICD-10-CM

## 2015-10-08 DIAGNOSIS — O09892 Supervision of other high risk pregnancies, second trimester: Secondary | ICD-10-CM

## 2015-10-08 DIAGNOSIS — Z3A18 18 weeks gestation of pregnancy: Secondary | ICD-10-CM | POA: Diagnosis not present

## 2015-10-08 LAB — POCT URINALYSIS DIPSTICK
Blood, UA: NEGATIVE
Glucose, UA: NEGATIVE
KETONES UA: NEGATIVE
Leukocytes, UA: NEGATIVE
Nitrite, UA: NEGATIVE
PROTEIN UA: NEGATIVE

## 2015-10-08 MED ORDER — MICONAZOLE NITRATE 2 % VA CREA: 1.0000 | TOPICAL_CREAM | Freq: Every day | VAGINAL | Status: DC

## 2015-10-08 NOTE — Progress Notes (Signed)
Pt was seen at ER for UTI and was treated with antibiotics and now has itching and swelling in vaginal area.

## 2015-10-08 NOTE — Progress Notes (Signed)
High Risk Pregnancy Diagnosis(es):   CHTN  [id number] 9516w1d Estimated Date of Delivery: 03/16/16  Blood pressure 120/80, pulse 91, weight 158 lb 8 oz (71.895 kg), last menstrual period 06/10/2015.  Urinalysis: Negative  HPI: The patient is being seen today for ongoing management of routine OB visit. Today she reports that she was seen in the ED and treated for an UTI with abx. Pt is now having itching/swelling to the vaginal area. Pt states that she has had light green vaginal discharge and vaginal spotting 4 days ago following intercourse. Denies any other symptoms. G3P2002  BP weight and urine results all reviewed and noted. Patient reports good fetal movement, denies any bleeding and no rupture of membranes symptoms or regular contractions.  Fundal Height:  13 cm Fetal Heart rate:  160 Edema:    Patient is without complaints other than noted in her HPI. All questions were answered.  All lab and sonogram results have been reviewed. Comments: negative UA  Physical Examination:   Physical Examination: Pelvic - normal external genitalia, vulva, vagina, cervix, uterus and adnexa, VAGINA: normal appearing vagina with normal color and discharge, no lesions,  WET MOUNT done - results: heavy yellow diffuse discolored KOH + for yeast. Wet prep negative for trich, +for WBCs,  CERVIX: normal appearing cervix without discharge or lesions,   Discussed with pt risks and benefits of sterilization. At end of discussion, pt had opportunity to ask questions and has no further questions at this time. Greater than 50% was spent in counseling and coordination of care with the patient. Total time greater than: 10 minutes  Assessment:  1.  Pregnancy at 8316w1d,  Estimated Date of Delivery: 03/16/16 :  CHTN                        2.  Yeast Infection                        3.  Follow up in 2 weeks for anatomy US  Medication(s) Plans:  Monistat Rx  Treatment Plan:   No Follow-up on file. for appointment for  high risk OB care  No orders of the defined types were placed in this encounter.   Orders Placed This Encounter  Procedures  . POCT urinalysis dipstick   By signing my name below, I, Soijett Blue, attest that this documentation has been prepared under the direction and in the presence of Tilda BurrowJohn Erwin Nishiyama V, MD. Electronically Signed: Soijett Blue, ED Scribe. 10/08/2015. 1:54 PM.  I personally performed the services described in this documentation, which was SCRIBED in my presence. The recorded information has been reviewed and considered accurate. It has been edited as necessary during review. Tilda BurrowFERGUSON,Rohini Jaroszewski V, MD

## 2015-10-08 NOTE — ED Notes (Signed)
Post ED Visit - Positive Culture Follow-up  Culture report reviewed by antimicrobial stewardship pharmacist:  []  Enzo BiNathan Batchelder, Pharm.D. [x]  Celedonio MiyamotoJeremy Frens, [address]Ph[address]., BCPS []  Garvin FilaMike Maccia, Pharm.D. []  Georgina PillionElizabeth Martin, Pharm.D., BCPS []  ManvelMinh Pham, [place]arm[place]D., BCPS, AAHIVP []  Estella HuskMichelle Turner, Pharm.D., BCPS, AAHIVP []  Tennis Mustassie Stewart, Pharm.D. []  Rob Oswaldo DoneVincent, [address]  Positive urine culture Treated with Cephalexin, organism sensitive to the same and no further patient follow-up is required at this time.  Virl AxeRobertson, Osten Jane[facility] 10/08/2015, 11:13 AM

## 2015-10-16 ENCOUNTER — Other Ambulatory Visit: Payer: Self-pay | Admitting: Obstetrics and Gynecology

## 2015-10-16 DIAGNOSIS — Z1389 Encounter for screening for other disorder: Secondary | ICD-10-CM

## 2015-10-21 ENCOUNTER — Other Ambulatory Visit: Payer: BLUE CROSS/BLUE SHIELD

## 2015-10-21 ENCOUNTER — Ambulatory Visit (INDEPENDENT_AMBULATORY_CARE_PROVIDER_SITE_OTHER): Payer: Medicaid Other

## 2015-10-21 ENCOUNTER — Encounter: Payer: Self-pay | Admitting: Obstetrics & Gynecology

## 2015-10-21 ENCOUNTER — Ambulatory Visit (INDEPENDENT_AMBULATORY_CARE_PROVIDER_SITE_OTHER): Payer: Medicaid Other | Admitting: Obstetrics & Gynecology

## 2015-10-21 VITALS — BP 136/90 | HR 80 | Wt 159.3 lb

## 2015-10-21 DIAGNOSIS — Z1389 Encounter for screening for other disorder: Secondary | ICD-10-CM

## 2015-10-21 DIAGNOSIS — O360121 Maternal care for anti-D [Rh] antibodies, second trimester, fetus 1: Secondary | ICD-10-CM | POA: Diagnosis not present

## 2015-10-21 DIAGNOSIS — Z36 Encounter for antenatal screening of mother: Secondary | ICD-10-CM | POA: Diagnosis not present

## 2015-10-21 DIAGNOSIS — O10012 Pre-existing essential hypertension complicating pregnancy, second trimester: Secondary | ICD-10-CM | POA: Diagnosis not present

## 2015-10-21 DIAGNOSIS — O09892 Supervision of other high risk pregnancies, second trimester: Secondary | ICD-10-CM | POA: Diagnosis not present

## 2015-10-21 DIAGNOSIS — O10919 Unspecified pre-existing hypertension complicating pregnancy, unspecified trimester: Secondary | ICD-10-CM

## 2015-10-21 DIAGNOSIS — Z331 Pregnant state, incidental: Secondary | ICD-10-CM

## 2015-10-21 DIAGNOSIS — Z3A19 19 weeks gestation of pregnancy: Secondary | ICD-10-CM

## 2015-10-21 LAB — POCT URINALYSIS DIPSTICK
GLUCOSE UA: 3
LEUKOCYTES UA: NEGATIVE
NITRITE UA: NEGATIVE
Protein, UA: NEGATIVE
RBC UA: NEGATIVE

## 2015-10-21 NOTE — Progress Notes (Signed)
US 19 wks,cephalic,post pl gr 0,svp of fluid 6 cm,normal ov's bilat,cx 3.8 cm,fhr 153 bpm,efw 267 g,measurements c/w dates,anatomy complete,no obvious abnormalities seen

## 2015-10-21 NOTE — Progress Notes (Signed)
[id number] 4562w0d Estimated Date of Delivery: 03/16/16  Blood pressure 136/90, pulse 80, weight 159 lb 4.8 oz (72.258 kg), last menstrual period 06/10/2015.   BP weight and urine results all reviewed and noted.  Please refer to the obstetrical flow sheet for the fundal height and fetal heart rate documentation:  Patient reports good fetal movement, denies any bleeding and no rupture of membranes symptoms or regular contractions. Patient is without complaints. All questions were answered.  Orders Placed This Encounter  Procedures  . POCT urinalysis dipstick    Plan:  Continued routine obstetrical care, sonogram is normal see report  No Follow-up on file.

## 2015-11-17 ENCOUNTER — Encounter: Payer: Self-pay | Admitting: Women's Health

## 2015-11-17 ENCOUNTER — Ambulatory Visit (INDEPENDENT_AMBULATORY_CARE_PROVIDER_SITE_OTHER): Payer: Medicaid Other | Admitting: Women's Health

## 2015-11-17 VITALS — BP 124/76 | HR 92 | Wt 163.0 lb

## 2015-11-17 DIAGNOSIS — O360121 Maternal care for anti-D [Rh] antibodies, second trimester, fetus 1: Secondary | ICD-10-CM

## 2015-11-17 DIAGNOSIS — Z1389 Encounter for screening for other disorder: Secondary | ICD-10-CM

## 2015-11-17 DIAGNOSIS — O10912 Unspecified pre-existing hypertension complicating pregnancy, second trimester: Secondary | ICD-10-CM | POA: Diagnosis not present

## 2015-11-17 DIAGNOSIS — Z3A23 23 weeks gestation of pregnancy: Secondary | ICD-10-CM | POA: Diagnosis not present

## 2015-11-17 DIAGNOSIS — O10919 Unspecified pre-existing hypertension complicating pregnancy, unspecified trimester: Secondary | ICD-10-CM

## 2015-11-17 DIAGNOSIS — O09892 Supervision of other high risk pregnancies, second trimester: Secondary | ICD-10-CM | POA: Diagnosis not present

## 2015-11-17 DIAGNOSIS — Z331 Pregnant state, incidental: Secondary | ICD-10-CM

## 2015-11-17 LAB — POCT URINALYSIS DIPSTICK
Blood, UA: NEGATIVE
GLUCOSE UA: NEGATIVE
KETONES UA: NEGATIVE
LEUKOCYTES UA: NEGATIVE
Nitrite, UA: NEGATIVE
PROTEIN UA: NEGATIVE

## 2015-11-17 NOTE — Progress Notes (Signed)
High Risk Pregnancy Diagnosis(es): CHTN (no meds currently) G3P2002 3451w6d Estimated Date of Delivery: 03/16/16 BP 124/76 mmHg  Pulse 92  Wt 163 lb (73.936 kg)  LMP 06/10/2015 (Exact Date)  Urinalysis: Negative HPI:  Cold sx x 2d, nonproductive cough, runny nose, scratchy throat- hasn't tried any allergy meds 'funny about meds' BP, weight, and urine reviewed.  Reports good fm. Denies regular uc's, lof, vb, uti s/s. No complaints.  Oropharynx clear w/o exudate/erythema HRRR LCTAB Multiple nevi on back/chest- some large/irregular, has never been to dermatologist- number given to call and schedule appt Fundal Height:  23 Fetal Heart rate:  154 Edema:  none  Reviewed ptl s/s, fm. Gave printed uri relief measures, call if worsening/not improving after 7-10d All questions were answered Assessment: 1351w6d CHTN (no meds currently) Medication(s) Plans:  Baby ASA daily Treatment Plan:  Growth u/s @ 28, 34, 38wks     2x/wk testing nst/sono @ 32wks    Deliver @ 40wks (no meds), 39wks (meds) Follow up in 4wks for high-risk OB appt, pn2, and growth/afi u/s

## 2015-11-17 NOTE — Patient Instructions (Addendum)
Dr. Margo AyeHall [phone]  You will have your sugar test next visit.  Please do not eat or drink anything after midnight the night before you come, not even water.  You will be here for at least two hours.     Call the office [phone]) or [facility] if:  You begin to have strong, frequent contractions  Your water breaks.  Sometimes it is a big gush of fluid, sometimes it is just a trickle that keeps getting your panties wet or running down your legs  You have vaginal bleeding.  It is normal to have a small amount of spotting if your cervix was checked.   You don't feel your baby moving like normal.  If you don't, get you something to eat and drink and lay down and focus on feeling your baby move.   If your baby is still not moving like normal, you should call the office or [facility].  You have a viral infection that will resolve on its own over time.  Symptoms typically last 3-7 days but can stretch out to 2-3 weeks.  Unfortunately, antibiotics are not helpful for viral infections.  Humidifier and saline nasal spray for nasal congestion  Regular robitussin, cough drops for cough  Warm salt water gargles for sore throat  Mucinex with lots of water to help you cough up the mucous in your chest if needed  Drink plenty of fluids and stay hydrated!  Wash your hands frequently.  Call if you are not improving by 7-10 days.     Second Trimester of Pregnancy The second trimester is from week 13 through week 28, months 4 through 6. The second trimester is often a time when you feel your best. Your body has also adjusted to being pregnant, and you begin to feel better physically. Usually, morning sickness has lessened or quit completely, you may have more energy, and you may have an increase in appetite. The second trimester is also a time when the fetus is growing rapidly. At the end of the sixth month, the fetus is about 9 inches long and weighs about 1 pounds. You will  likely begin to feel the baby move (quickening) between 18 and 20 weeks of the pregnancy. BODY CHANGES Your body goes through many changes during pregnancy. The changes vary from woman to woman.  12. Your weight will continue to increase. You will notice your lower abdomen bulging out. 13. You may begin to get stretch marks on your hips, abdomen, and breasts. 14. You may develop headaches that can be relieved by medicines approved by your health care provider. 15. You may urinate more often because the fetus is pressing on your bladder. 16. You may develop or continue to have heartburn as a result of your pregnancy. 17. You may develop constipation because certain hormones are causing the muscles that push waste through your intestines to slow down. 18. You may develop hemorrhoids or swollen, bulging veins (varicose veins). 19. You may have back pain because of the weight gain and pregnancy hormones relaxing your joints between the bones in your pelvis and as a result of a shift in weight and the muscles that support your balance. 20. Your breasts will continue to grow and be tender. 21. Your gums may bleed and may be sensitive to brushing and flossing. 22. Dark spots or blotches (chloasma, mask of pregnancy) may develop on your face. This will likely fade after the baby is born. 23. A dark  line from your belly button to the pubic area (linea nigra) may appear. This will likely fade after the baby is born. 24. You may have changes in your hair. These can include thickening of your hair, rapid growth, and changes in texture. Some women also have hair loss during or after pregnancy, or hair that feels dry or thin. Your hair will most likely return to normal after your baby is born. WHAT TO EXPECT AT YOUR PRENATAL VISITS During a routine prenatal visit:  You will be weighed to make sure you and the fetus are growing normally.  Your blood pressure will be taken.  Your abdomen will be measured to  track your baby's growth.  The fetal heartbeat will be listened to.  Any test results from the previous visit will be discussed. Your health care provider may ask you:  How you are feeling.  If you are feeling the baby move.  If you have had any abnormal symptoms, such as leaking fluid, bleeding, severe headaches, or abdominal cramping.  If you have any questions. Other tests that may be performed during your second trimester include:  Blood tests that check for:  Low iron levels (anemia).  Gestational diabetes (between 24 and 28 weeks).  Rh antibodies.  Urine tests to check for infections, diabetes, or protein in the urine.  An ultrasound to confirm the proper growth and development of the baby.  An amniocentesis to check for possible genetic problems.  Fetal screens for spina bifida and Down syndrome. HOME CARE INSTRUCTIONS   Avoid all smoking, herbs, alcohol, and unprescribed drugs. These chemicals affect the formation and growth of the baby.  Follow your health care provider's instructions regarding medicine use. There are medicines that are either safe or unsafe to take during pregnancy.  Exercise only as directed by your health care provider. Experiencing uterine cramps is a good sign to stop exercising.  Continue to eat regular, healthy meals.  Wear a good support bra for breast tenderness.  Do not use hot tubs, steam rooms, or saunas.  Wear your seat belt at all times when driving.  Avoid raw meat, uncooked cheese, cat litter boxes, and soil used by cats. These carry germs that can cause birth defects in the baby.  Take your prenatal vitamins.  Try taking a stool softener (if your health care provider approves) if you develop constipation. Eat more high-fiber foods, such as fresh vegetables or fruit and whole grains. Drink plenty of fluids to keep your urine clear or pale yellow.  Take warm sitz baths to soothe any pain or discomfort caused by hemorrhoids.  Use hemorrhoid cream if your health care provider approves.  If you develop varicose veins, wear support hose. Elevate your feet for 15 minutes, 3-4 times a day. Limit salt in your diet.  Avoid heavy lifting, wear low heel shoes, and practice good posture.  Rest with your legs elevated if you have leg cramps or low back pain.  Visit your dentist if you have not gone yet during your pregnancy. Use a soft toothbrush to brush your teeth and be gentle when you floss.  A sexual relationship may be continued unless your health care provider directs you otherwise.  Continue to go to all your prenatal visits as directed by your health care provider. SEEK MEDICAL CARE IF:   You have dizziness.  You have mild pelvic cramps, pelvic pressure, or nagging pain in the abdominal area.  You have persistent nausea, vomiting, or diarrhea.  You have  a bad smelling vaginal discharge.  You have pain with urination. SEEK IMMEDIATE MEDICAL CARE IF:   You have a fever.  You are leaking fluid from your vagina.  You have spotting or bleeding from your vagina.  You have severe abdominal cramping or pain.  You have rapid weight gain or loss.  You have shortness of breath with chest pain.  You notice sudden or extreme swelling of your face, hands, ankles, feet, or legs.  You have not felt your baby move in over an hour.  You have severe headaches that do not go away with medicine.  You have vision changes. Document Released: 05/25/2001 Document Revised: 06/05/2013 Document Reviewed: 08/01/2012 [facility] Patient Information 2015 [place], [place]. This information is not intended to replace advice given to you by your health care provider. Make sure you discuss any questions you have with your health care provider.

## 2015-12-08 ENCOUNTER — Telehealth: Payer: Self-pay | Admitting: *Deleted

## 2015-12-08 NOTE — Telephone Encounter (Signed)
Pt states woke yesterday with some abdominal tightness, nausea, +FM, no vaginal bleeding, no gush of fluids and happened again yesterday evening. Informed pt that could be round ligament pain, encouraged to push fluids, rest, and take Tylenol. If no improvement call our office back. Pt verbalized understanding and states she does have an appt this Friday.

## 2015-12-12 ENCOUNTER — Ambulatory Visit (INDEPENDENT_AMBULATORY_CARE_PROVIDER_SITE_OTHER): Payer: Medicaid Other

## 2015-12-12 ENCOUNTER — Ambulatory Visit (INDEPENDENT_AMBULATORY_CARE_PROVIDER_SITE_OTHER): Payer: Medicaid Other | Admitting: Obstetrics & Gynecology

## 2015-12-12 ENCOUNTER — Encounter: Payer: Self-pay | Admitting: Obstetrics & Gynecology

## 2015-12-12 ENCOUNTER — Other Ambulatory Visit: Payer: Medicaid Other

## 2015-12-12 VITALS — BP 102/60 | HR 92 | Wt 164.0 lb

## 2015-12-12 DIAGNOSIS — O10919 Unspecified pre-existing hypertension complicating pregnancy, unspecified trimester: Secondary | ICD-10-CM

## 2015-12-12 DIAGNOSIS — Z1389 Encounter for screening for other disorder: Secondary | ICD-10-CM

## 2015-12-12 DIAGNOSIS — O10912 Unspecified pre-existing hypertension complicating pregnancy, second trimester: Secondary | ICD-10-CM | POA: Diagnosis not present

## 2015-12-12 DIAGNOSIS — O09892 Supervision of other high risk pregnancies, second trimester: Secondary | ICD-10-CM | POA: Diagnosis not present

## 2015-12-12 DIAGNOSIS — Z369 Encounter for antenatal screening, unspecified: Secondary | ICD-10-CM

## 2015-12-12 DIAGNOSIS — Z331 Pregnant state, incidental: Secondary | ICD-10-CM

## 2015-12-12 DIAGNOSIS — O360121 Maternal care for anti-D [Rh] antibodies, second trimester, fetus 1: Secondary | ICD-10-CM

## 2015-12-12 DIAGNOSIS — Z3A27 27 weeks gestation of pregnancy: Secondary | ICD-10-CM | POA: Diagnosis not present

## 2015-12-12 DIAGNOSIS — Z131 Encounter for screening for diabetes mellitus: Secondary | ICD-10-CM

## 2015-12-12 LAB — POCT URINALYSIS DIPSTICK
GLUCOSE UA: NEGATIVE
Ketones, UA: NEGATIVE
Leukocytes, UA: NEGATIVE
NITRITE UA: NEGATIVE
Protein, UA: NEGATIVE
RBC UA: NEGATIVE

## 2015-12-12 MED ORDER — OMEPRAZOLE 20 MG PO CPDR
20.0000 mg | DELAYED_RELEASE_CAPSULE | Freq: Every day | ORAL | Status: DC
Start: 2015-12-12 — End: 2016-01-10

## 2015-12-12 NOTE — Progress Notes (Signed)
US 26+3 wks,cephalic,cx 3.4 cm,normal ov's bilat,post pl gr 0,svp 5.1 cm,fhr 147 bpm,efw 987 g,56%

## 2015-12-12 NOTE — Progress Notes (Signed)
Fetal Surveillance Testing today:  Sonogram is normal   High Risk Pregnancy Diagnosis(es):   Chronic hypertension, no meds  G3P2002 3528w3d Estimated Date of Delivery: 03/16/16  Blood pressure 102/60, pulse 92, weight 164 lb (74.39 kg), last menstrual period 06/10/2015.  Urinalysis: Negative   HPI: The patient is being seen today for ongoing management of chronic hypertension. Today she reports GERD   BP weight and urine results all reviewed and noted. Patient reports good fetal movement, denies any bleeding and no rupture of membranes symptoms or regular contractions.  Fundal Height:  26 Fetal Heart rate:  147 Edema:  none  Patient is without complaints other than noted in her HPI. All questions were answered.  All lab and sonogram results have been reviewed. Comments:    Assessment:  1.  Pregnancy at 3228w3d,  Estimated Date of Delivery: 03/16/16 :                          2.  Chronic hypertension                        3.    Medication(s) Plans:    Treatment Plan:    No Follow-up on file. for appointment for high risk OB care  No orders of the defined types were placed in this encounter.   Orders Placed This Encounter  Procedures  . POCT urinalysis dipstick

## 2015-12-13 LAB — CBC
HEMATOCRIT: 32.5 % — AB (ref 34.0–46.6)
HEMOGLOBIN: 10.6 g/dL — AB (ref 11.1–15.9)
MCH: 28.4 pg (ref 26.6–33.0)
MCHC: 32.6 g/dL (ref 31.5–35.7)
MCV: 87 fL (ref 79–97)
Platelets: 360 10*3/uL (ref 150–379)
RBC: 3.73 x10E6/uL — ABNORMAL LOW (ref 3.77–5.28)
RDW: 13.4 % (ref 12.3–15.4)
WBC: 12.3 10*3/uL — ABNORMAL HIGH (ref 3.4–10.8)

## 2015-12-13 LAB — RPR: RPR: NONREACTIVE

## 2015-12-13 LAB — HIV ANTIBODY (ROUTINE TESTING W REFLEX): HIV SCREEN 4TH GENERATION: NONREACTIVE

## 2015-12-13 LAB — GLUCOSE TOLERANCE, 2 HOURS W/ 1HR
GLUCOSE, 1 HOUR: 175 mg/dL (ref 65–179)
GLUCOSE, 2 HOUR: 85 mg/dL (ref 65–152)
Glucose, Fasting: 80 mg/dL (ref 65–91)

## 2015-12-13 LAB — ANTIBODY SCREEN: Antibody Screen: NEGATIVE

## 2016-01-04 ENCOUNTER — Inpatient Hospital Stay (HOSPITAL_COMMUNITY)
Admission: AD | Admit: 2016-01-04 | Discharge: 2016-01-04 | Disposition: A | Discharge status: Home / Self Care | Payer: Medicaid Other | Source: Ambulatory Visit | Attending: Obstetrics & Gynecology | Admitting: Obstetrics & Gynecology

## 2016-01-04 ENCOUNTER — Encounter (HOSPITAL_COMMUNITY): Payer: Self-pay

## 2016-01-04 DIAGNOSIS — F329 Major depressive disorder, single episode, unspecified: Secondary | ICD-10-CM | POA: Diagnosis not present

## 2016-01-04 DIAGNOSIS — F419 Anxiety disorder, unspecified: Secondary | ICD-10-CM | POA: Diagnosis not present

## 2016-01-04 DIAGNOSIS — O09892 Supervision of other high risk pregnancies, second trimester: Secondary | ICD-10-CM

## 2016-01-04 DIAGNOSIS — O162 Unspecified maternal hypertension, second trimester: Secondary | ICD-10-CM | POA: Diagnosis not present

## 2016-01-04 DIAGNOSIS — R51 Headache: Secondary | ICD-10-CM | POA: Diagnosis not present

## 2016-01-04 DIAGNOSIS — O10913 Unspecified pre-existing hypertension complicating pregnancy, third trimester: Secondary | ICD-10-CM

## 2016-01-04 DIAGNOSIS — O2942 Spinal and epidural anesthesia induced headache during pregnancy, second trimester: Secondary | ICD-10-CM | POA: Diagnosis not present

## 2016-01-04 DIAGNOSIS — Z87891 Personal history of nicotine dependence: Secondary | ICD-10-CM | POA: Diagnosis not present

## 2016-01-04 DIAGNOSIS — Z3A00 Weeks of gestation of pregnancy not specified: Secondary | ICD-10-CM | POA: Diagnosis not present

## 2016-01-04 DIAGNOSIS — O99342 Other mental disorders complicating pregnancy, second trimester: Secondary | ICD-10-CM | POA: Insufficient documentation

## 2016-01-04 DIAGNOSIS — I10 Essential (primary) hypertension: Secondary | ICD-10-CM | POA: Diagnosis present

## 2016-01-04 LAB — CBC
HEMATOCRIT: 33.1 % — AB (ref 36.0–46.0)
HEMOGLOBIN: 10.9 g/dL — AB (ref 12.0–15.0)
MCH: 28.8 pg (ref 26.0–34.0)
MCHC: 32.9 g/dL (ref 30.0–36.0)
MCV: 87.3 fL (ref 78.0–100.0)
Platelets: 375 10*3/uL (ref 150–400)
RBC: 3.79 MIL/uL — ABNORMAL LOW (ref 3.87–5.11)
RDW: 14 % (ref 11.5–15.5)
WBC: 12.1 10*3/uL — ABNORMAL HIGH (ref 4.0–10.5)

## 2016-01-04 LAB — COMPREHENSIVE METABOLIC PANEL
ALBUMIN: 3.4 g/dL — AB (ref 3.5–5.0)
ALK PHOS: 72 U/L (ref 38–126)
ALT: 11 U/L — ABNORMAL LOW (ref 14–54)
ANION GAP: 10 (ref 5–15)
AST: 15 U/L (ref 15–41)
BILIRUBIN TOTAL: 0.4 mg/dL (ref 0.3–1.2)
BUN: 6 mg/dL (ref 6–20)
CALCIUM: 9 mg/dL (ref 8.9–10.3)
CO2: 20 mmol/L — ABNORMAL LOW (ref 22–32)
Chloride: 106 mmol/L (ref 101–111)
Creatinine, Ser: 0.51 mg/dL (ref 0.44–1.00)
GFR calc Af Amer: >60 mL/min (ref >60)
GLUCOSE: 85 mg/dL (ref 65–99)
POTASSIUM: 3.6 mmol/L (ref 3.5–5.1)
Sodium: 136 mmol/L (ref 135–145)
TOTAL PROTEIN: 6.9 g/dL (ref 6.5–8.1)

## 2016-01-04 LAB — URINALYSIS, ROUTINE W REFLEX MICROSCOPIC
Bilirubin Urine: NEGATIVE
GLUCOSE, UA: NEGATIVE mg/dL
KETONES UR: NEGATIVE mg/dL
Leukocytes, UA: NEGATIVE
Nitrite: NEGATIVE
PROTEIN: NEGATIVE mg/dL
Specific Gravity, Urine: <1.005 — ABNORMAL LOW (ref 1.005–1.030)
pH: 5.5 (ref 5.0–8.0)

## 2016-01-04 LAB — PROTEIN / CREATININE RATIO, URINE
CREATININE, URINE: 22 mg/dL
Total Protein, Urine: <6 mg/dL

## 2016-01-04 LAB — URINE MICROSCOPIC-ADD ON

## 2016-01-04 NOTE — MAU Provider Note (Signed)
History     CSN: [id number]  Arrival date & time 01/04/16  1621   None     Chief Complaint  Patient presents with  . Hypertension    Marissa Reese comes in reporting "I keep track of my blood pressures at home and they've been getting higher the past 3 days."  Hx Pre-e with first pregnancy "and since then, they said I should keep track of my pressures."  In past three days, BPs have ranged 120-140's/80-90's.  She reports a headache earlier today "that was more annoying than anything, maybe a 5-6/10, and it went away when I just rested for a while."  Denies visual changes.  Denies abd pain, but does report intermittent GI upset "usually constipated, but sometimes an upset stomach" but none today.  Stays well hydrated with >12 glasses water/day.  Denies vaginal discharge, bleeding, itching, pain.  Denies urinary frequency, urgency, pain.  Denies contractions, but did feel "cramping past 2 nights because I was doing too much.  I did less today and it's all gone away."  She denies medical problems or current medication use, but then adds that she used to take Lisinopril "for gallbladder pain, but then they took it out and I kept taking the medicine.  My doctor told me to stop taking it when I got pregnant."   Hypertension  Pertinent negatives include no chest pain or shortness of breath.    Past Medical History:  Diagnosis Date  . Anxiety   . Depression   . Essential hypertension    Medication since 04/2015  . History of headache   . Pregnant 07/17/2015  . Vaginal itching 08/19/2015  . Yeast infection 08/19/2015    Past Surgical History:  Procedure Laterality Date  . CHOLECYSTECTOMY      Family History  Problem Relation Age of Onset  . Diabetes Father   . Heart attack Father     Age 64  . Hypertension Father   . Hypertension Mother   . Hyperlipidemia Mother   . Heart attack Maternal Grandfather   . Heart attack Paternal Grandmother   . Stroke Paternal Grandmother   . Heart attack  Paternal Grandfather     Social History  Substance Use Topics  . Smoking status: Former Smoker    Packs/day: 0.00    Years: 17.00    Types: Cigarettes    Start date: 07/14/1991    Quit date: 07/13/2015  . Smokeless tobacco: Never Used  . Alcohol use No     Comment: Occasional; not now    OB History    Gravida Para Term Preterm AB Living   3 2 2     2    SAB TAB Ectopic Multiple Live Births                  Review of Systems  Constitutional: Negative.   Eyes: Negative for photophobia, pain and visual disturbance.  Respiratory: Negative for shortness of breath.   Cardiovascular: Negative for chest pain.  Gastrointestinal: Positive for constipation. Negative for abdominal pain and vomiting.  Genitourinary: Negative for difficulty urinating, dysuria, frequency, urgency, vaginal bleeding and vaginal discharge.  Neurological: Negative for dizziness, weakness and light-headedness.  Psychiatric/Behavioral: Negative for agitation and confusion. The patient is not nervous/anxious.     Allergies  Zolpidem tartrate and Latex  Home Medications    BP 135/87 (BP Location: Right Arm)   Pulse 100   Temp 98.3 F (36.8 C) (Oral)   Resp 18   Ht 5' (  1.524 m)   Wt 169 lb (76.7 kg)   LMP 06/10/2015 (Exact Date)   BMI 33.01 kg/m   Physical Exam  Constitutional: She is oriented to person, place, and time. She appears well-developed and well-nourished.  Cardiovascular: Normal rate.   Pulmonary/Chest: Effort normal.  Abdominal: Soft.  Neurological: She is alert and oriented to person, place, and time. She has normal reflexes.  Skin: Skin is warm and dry.  Psychiatric: She has a normal mood and affect. Her behavior is normal. Judgment and thought content normal.  SVE: deferred EFM: 145/mod/+A/-D Toco: q4-5  MAU Course  Procedures (including critical care time)  Labs Reviewed  URINALYSIS, ROUTINE W REFLEX MICROSCOPIC (NOT AT [facility])  PROTEIN / CREATININE RATIO, URINE  CBC   COMPREHENSIVE METABOLIC PANEL   No results found.   1. Supervision of other high-risk pregnancy, second trimester       MDM  -CMP, CBC, UA, UPC ratio all WNL, VSS will d/c home

## 2016-01-04 NOTE — MAU Note (Signed)
Patient has been checking her BP was 146/93 around 3:30  Has been high last week, no headache, no blurred vision, feels sluggish.

## 2016-01-04 NOTE — Discharge Instructions (Signed)
Hypertension Hypertension, commonly called high blood pressure, is when the force of blood pumping through your arteries is too strong. Your arteries are the blood vessels that carry blood from your heart throughout your body. A blood pressure reading consists of a higher number over a lower number, such as 110/72. The higher number (systolic) is the pressure inside your arteries when your heart pumps. The lower number (diastolic) is the pressure inside your arteries when your heart relaxes. Ideally you want your blood pressure below 120/80. Hypertension forces your heart to work harder to pump blood. Your arteries may become narrow or stiff. Having untreated or uncontrolled hypertension can cause heart attack, stroke, kidney disease, and other problems. RISK FACTORS Some risk factors for high blood pressure are controllable. Others are not.  Risk factors you cannot control include:   Race. You may be at higher risk if you are African American.  Age. Risk increases with age.  Gender. Men are at higher risk than women before age 45 years. After age 65, women are at higher risk than men. Risk factors you can control include:  Not getting enough exercise or physical activity.  Being overweight.  Getting too much fat, sugar, calories, or salt in your diet.  Drinking too much alcohol. SIGNS AND SYMPTOMS Hypertension does not usually cause signs or symptoms. Extremely high blood pressure (hypertensive crisis) may cause headache, anxiety, shortness of breath, and nosebleed. DIAGNOSIS To check if you have hypertension, your health care provider will measure your blood pressure while you are seated, with your arm held at the level of your heart. It should be measured at least twice using the same arm. Certain conditions can cause a difference in blood pressure between your right and left arms. A blood pressure reading that is higher than normal on one occasion does not mean that you need treatment. If  it is not clear whether you have high blood pressure, you may be asked to return on a different day to have your blood pressure checked again. Or, you may be asked to monitor your blood pressure at home for 1 or more weeks. TREATMENT Treating high blood pressure includes making lifestyle changes and possibly taking medicine. Living a healthy lifestyle can help lower high blood pressure. You may need to change some of your habits. Lifestyle changes may include:  Following the DASH diet. This diet is high in fruits, vegetables, and whole grains. It is low in salt, red meat, and added sugars.  Keep your sodium intake below 2,300 mg per day.  Getting at least 30-45 minutes of aerobic exercise at least 4 times per week.  Losing weight if necessary.  Not smoking.  Limiting alcoholic beverages.  Learning ways to reduce stress. Your health care provider may prescribe medicine if lifestyle changes are not enough to get your blood pressure under control, and if one of the following is true:  You are 18-59 years of age and your systolic blood pressure is above 140.  You are 60 years of age or older, and your systolic blood pressure is above 150.  Your diastolic blood pressure is above 90.  You have diabetes, and your systolic blood pressure is over 140 or your diastolic blood pressure is over 90.  You have kidney disease and your blood pressure is above 140/90.  You have heart disease and your blood pressure is above 140/90. Your personal target blood pressure may vary depending on your medical conditions, your age, and other factors. HOME CARE INSTRUCTIONS    Have your blood pressure rechecked as directed by your health care provider.   Take medicines only as directed by your health care provider. Follow the directions carefully. Blood pressure medicines must be taken as prescribed. The medicine does not work as well when you skip doses. Skipping doses also puts you at risk for  problems.  Do not smoke.   Monitor your blood pressure at home as directed by your health care provider. SEEK MEDICAL CARE IF:   You think you are having a reaction to medicines taken.  You have recurrent headaches or feel dizzy.  You have swelling in your ankles.  You have trouble with your vision. SEEK IMMEDIATE MEDICAL CARE IF:  You develop a severe headache or confusion.  You have unusual weakness, numbness, or feel faint.  You have severe chest or abdominal pain.  You vomit repeatedly.  You have trouble breathing. MAKE SURE YOU:   Understand these instructions.  Will watch your condition.  Will get help right away if you are not doing well or get worse.   This information is not intended to replace advice given to you by your health care provider. Make sure you discuss any questions you have with your health care provider.   Document Released: 05/31/2005 Document Revised: 10/15/2014 Document Reviewed: 03/23/2013 Elsevier Interactive Patient Education 2016 Elsevier Inc.  

## 2016-01-05 ENCOUNTER — Ambulatory Visit (INDEPENDENT_AMBULATORY_CARE_PROVIDER_SITE_OTHER): Payer: Medicaid Other | Admitting: Women's Health

## 2016-01-05 ENCOUNTER — Encounter: Payer: Self-pay | Admitting: Women's Health

## 2016-01-05 VITALS — BP 128/78 | HR 96 | Wt 168.0 lb

## 2016-01-05 DIAGNOSIS — Z3A30 30 weeks gestation of pregnancy: Secondary | ICD-10-CM

## 2016-01-05 DIAGNOSIS — Z6791 Unspecified blood type, Rh negative: Secondary | ICD-10-CM

## 2016-01-05 DIAGNOSIS — O09893 Supervision of other high risk pregnancies, third trimester: Secondary | ICD-10-CM | POA: Diagnosis not present

## 2016-01-05 DIAGNOSIS — O360131 Maternal care for anti-D [Rh] antibodies, third trimester, fetus 1: Secondary | ICD-10-CM | POA: Diagnosis not present

## 2016-01-05 DIAGNOSIS — Z1389 Encounter for screening for other disorder: Secondary | ICD-10-CM

## 2016-01-05 DIAGNOSIS — Z331 Pregnant state, incidental: Secondary | ICD-10-CM

## 2016-01-05 DIAGNOSIS — O10912 Unspecified pre-existing hypertension complicating pregnancy, second trimester: Secondary | ICD-10-CM | POA: Diagnosis not present

## 2016-01-05 DIAGNOSIS — O10919 Unspecified pre-existing hypertension complicating pregnancy, unspecified trimester: Secondary | ICD-10-CM

## 2016-01-05 LAB — POCT URINALYSIS DIPSTICK
Blood, UA: NEGATIVE
Glucose, UA: NEGATIVE
Ketones, UA: NEGATIVE
LEUKOCYTES UA: NEGATIVE
NITRITE UA: NEGATIVE

## 2016-01-05 MED ORDER — RHO D IMMUNE GLOBULIN 1500 UNIT/2ML IJ SOSY
300.0000 ug | PREFILLED_SYRINGE | Freq: Once | INTRAMUSCULAR | Status: AC
  Administered 2016-01-05: 300 ug via INTRAMUSCULAR

## 2016-01-05 NOTE — Patient Instructions (Addendum)
Call the office [phone]) or go to [facility] if:  You begin to have strong, frequent contractions  Your water breaks.  Sometimes it is a big gush of fluid, sometimes it is just a trickle that keeps getting your panties wet or running down your legs  You have vaginal bleeding.  It is normal to have a small amount of spotting if your cervix was checked.   You don't feel your baby moving like normal.  If you don't, get you something to eat and drink and lay down and focus on feeling your baby move.  You should feel at least 10 movements in 2 hours.  If you don't, you should call the office or go to [facility].   Call the office [phone]) or go to [facility] hospital for these signs of pre-eclampsia:  Severe headache that does not go away with Tylenol  Visual changes- seeing spots, double, blurred vision  Pain under your right breast or upper abdomen that does not go away with Tums or heartburn medicine  Nausea and/or vomiting  Severe swelling in your hands, feet, and face     Tdap Vaccine  It is recommended that you get the Tdap vaccine during the third trimester of EACH pregnancy to help protect your baby from getting pertussis (whooping cough)  27-36 weeks is the BEST time to do this so that you can pass the protection on to your baby. During pregnancy is better than after pregnancy, but if you are unable to get it during pregnancy it will be offered at the hospital.   You can get this vaccine at the health department or your family doctor  Everyone who will be around your baby should also be up-to-date on their vaccines. Adults (who are not pregnant) only need 1 dose of Tdap during adulthood.        Preterm Labor Information Preterm labor is when labor starts at less than 37 weeks of pregnancy. The normal length of a pregnancy is 39 to 41 weeks. CAUSES Often, there is no identifiable underlying cause as to why a woman goes into preterm labor. One of the most common  known causes of preterm labor is infection. Infections of the uterus, cervix, vagina, amniotic sac, bladder, kidney, or even the lungs (pneumonia) can cause labor to start. Other suspected causes of preterm labor include:   Urogenital infections, such as yeast infections and bacterial vaginosis.   Uterine abnormalities (uterine shape, uterine septum, fibroids, or bleeding from the placenta).   A cervix that has been operated on (it may fail to stay closed).   Malformations in the fetus.   Multiple gestations (twins, triplets, and so on).   Breakage of the amniotic sac.  RISK FACTORS  Having a previous history of preterm labor.   Having premature rupture of membranes (PROM).   Having a placenta that covers the opening of the cervix (placenta previa).   Having a placenta that separates from the uterus (placental abruption).   Having a cervix that is too weak to hold the fetus in the uterus (incompetent cervix).   Having too much fluid in the amniotic sac (polyhydramnios).   Taking illegal drugs or smoking while pregnant.   Not gaining enough weight while pregnant.   Being younger than 42 and older than 34 years old.   Having a low socioeconomic status.   Being African American. SYMPTOMS Signs and symptoms of preterm labor include:   Menstrual-like cramps, abdominal pain, or back pain.  Uterine contractions  that are regular, as frequent as six in an hour, regardless of their intensity (may be mild or painful).  Contractions that start on the top of the uterus and spread down to the lower abdomen and back.   A sense of increased pelvic pressure.   A watery or bloody mucus discharge that comes from the vagina.  TREATMENT Depending on the length of the pregnancy and other circumstances, your health care provider may suggest bed rest. If necessary, there are medicines that can be given to stop contractions and to mature the fetal lungs. If labor happens  before 34 weeks of pregnancy, a prolonged hospital stay may be recommended. Treatment depends on the condition of both you and the fetus.  WHAT SHOULD YOU DO IF YOU THINK YOU ARE IN PRETERM LABOR? Call your health care provider right away. You will need to go to the hospital to get checked immediately. HOW CAN YOU PREVENT PRETERM LABOR IN FUTURE PREGNANCIES? You should:   Stop smoking if you smoke.  Maintain healthy weight gain and avoid chemicals and drugs that are not necessary.  Be watchful for any type of infection.  Inform your health care provider if you have a known history of preterm labor.   This information is not intended to replace advice given to you by your health care provider. Make sure you discuss any questions you have with your health care provider.   Document Released: 08/21/2003 Document Revised: 01/31/2013 Document Reviewed: 07/03/2012 Elsevier Interactive Patient Education 2016 Elsevier Inc.  Iron-Rich Diet Iron is a mineral that helps your body to produce hemoglobin. Hemoglobin is a protein in your red blood cells that carries oxygen to your body's tissues. Eating too little iron may cause you to feel weak and tired, and it can increase your risk for infection. Eating enough iron is necessary for your body's metabolism, muscle function, and nervous system. Iron is naturally found in many foods. It can also be added to foods or fortified in foods. There are two types of dietary iron:  Heme iron. Heme iron is absorbed by the body more easily than nonheme iron. Heme iron is found in meat, poultry, and fish.  Nonheme iron. Nonheme iron is found in dietary supplements, iron-fortified grains, beans, and vegetables. You may need to follow an iron-rich diet if:  You have been diagnosed with iron deficiency or iron-deficiency anemia.  You have a condition that prevents you from absorbing dietary iron, such as:  Infection in your intestines.  Celiac disease. This  involves long-lasting (chronic) inflammation of your intestines.  You do not eat enough iron.  You eat a diet that is high in foods that impair iron absorption.  You have lost a lot of blood.  You have heavy bleeding during your menstrual cycle.  You are pregnant. WHAT IS MY PLAN? Your health care provider may help you to determine how much iron you need per day based on your condition. Generally, when a person consumes sufficient amounts of iron in the diet, the following iron needs are met:  Men.  62-66 years old: 11 mg per day.  69-46 years old: 8 mg per day.  Women.   62-36 years old: 15 mg per day.  62-58 years old: 18 mg per day.  Over 50 years old: 8 mg per day.  Pregnant women: 27 mg per day.  Breastfeeding women: 9 mg per day. WHAT DO I NEED TO KNOW ABOUT AN IRON-RICH DIET?  Eat fresh fruits and vegetables that  are high in vitamin C along with foods that are high in iron. This will help increase the amount of iron that your body absorbs from food, especially with foods containing nonheme iron. Foods that are high in vitamin C include oranges, peppers, tomatoes, and mango.  Take iron supplements only as directed by your health care provider. Overdose of iron can be life-threatening. If you were prescribed iron supplements, take them with orange juice or a vitamin C supplement.  Cook foods in pots and pans that are made from iron.   Eat nonheme iron-containing foods alongside foods that are high in heme iron. This helps to improve your iron absorption.   Certain foods and drinks contain compounds that impair iron absorption. Avoid eating these foods in the same meal as iron-rich foods or with iron supplements. These include:  Coffee, black tea, and red wine.  Milk, dairy products, and foods that are high in calcium.  Beans, soybeans, and peas.  Whole grains.  When eating foods that contain both nonheme iron and compounds that impair iron absorption, follow  these tips to absorb iron better.   Soak beans overnight before cooking.  Soak whole grains overnight and drain them before using.  Ferment flours before baking, such as using yeast in bread dough. WHAT FOODS CAN I EAT? Grains Iron-fortified breakfast cereal. Iron-fortified whole-wheat bread. Enriched rice. Sprouted grains. Vegetables Spinach. Potatoes with skin. Green peas. Broccoli. Red and green bell peppers. Fermented vegetables. Fruits Prunes. Raisins. Oranges. Strawberries. Mango. Grapefruit. Meats and Other Protein Sources Beef liver. Oysters. Beef. Shrimp. [place]. Chicken. [place]. Sardines. Chickpeas. Nuts. Tofu. Beverages Tomato juice. Fresh orange juice. Prune juice. Hibiscus tea. Fortified instant breakfast shakes. Condiments Tahini. Fermented soy sauce. Sweets and Desserts Black-strap molasses.  Other Wheat germ. The items listed above may not be a complete list of recommended foods or beverages. Contact your dietitian for more options. WHAT FOODS ARE NOT RECOMMENDED? Grains Whole grains. Bran cereal. Bran flour. Oats. Vegetables Artichokes. Brussels sprouts. Kale. Fruits Blueberries. Raspberries. Strawberries. Figs. Meats and Other Protein Sources Soybeans. Products made from soy protein. Dairy Milk. Cream. Cheese. Yogurt. Cottage cheese. Beverages Coffee. Black tea. Red wine. Sweets and Desserts Cocoa. Chocolate. Ice cream. Other Basil. Oregano. Parsley. The items listed above may not be a complete list of foods and beverages to avoid. Contact your dietitian for more information.   This information is not intended to replace advice given to you by your health care provider. Make sure you discuss any questions you have with your health care provider.   Document Released: 01/12/2005 Document Revised: 06/21/2014 Document Reviewed: 12/26/2013 Elsevier Interactive Patient Education [employer].

## 2016-01-05 NOTE — Progress Notes (Signed)
High Risk Pregnancy Diagnosis(es): CHTN-no meds currently G3P2002 [redacted]w[redacted]d Estimated Date of Delivery: 03/16/16 BP 128/78   Pulse 96   Wt 168 lb (76.2 kg)   LMP 06/10/2015 (Exact Date)   BMI 32.81 kg/m   Urinalysis: Positive for tr protein HPI:  Went to [facility] yesterday for elevated home bp's- has been keeping a log since last visit, running 120s-140s/70s-90s, highest sbp 149, highest dbp 96. Denies ha, visual changes, ruq/epigastric pain, n/v.  Pre-e labs yesterday normal, slightly anemic- recommended bid Fe/increase fe-rich foods BP, weight, and urine reviewed.  Reports good fm. Denies regular uc's, lof, vb, uti s/s. Reports 'pregnancy mask' on face really bad- has covered up today w/ makeup.   Fundal Height:  29 Fetal Heart rate:  150  Edema:  None DTRs 2+, no clonus  Reviewed ptl s/s, fkc, pn2 results, pre-e s/s, and bp ranges to report All questions were answered Assessment: [redacted]w[redacted]d CHTN- no meds currently, bp normal today Medication(s) Plans:  Continue baby asa Treatment Plan: Growth u/s @ 34, 38wks     2x/wk testing nst/sono @ 32wks    Deliver @ 40wks (no meds), 39wks (meds) Follow up in 2wks for high-risk OB appt and bpp/dopp u/s

## 2016-01-10 ENCOUNTER — Inpatient Hospital Stay (HOSPITAL_COMMUNITY)
Admission: AD | Admit: 2016-01-10 | Discharge: 2016-01-10 | Disposition: A | Discharge status: Home / Self Care | Payer: Medicaid Other | Source: Ambulatory Visit | Attending: Family Medicine | Admitting: Family Medicine

## 2016-01-10 ENCOUNTER — Encounter (HOSPITAL_COMMUNITY): Payer: Self-pay | Admitting: *Deleted

## 2016-01-10 DIAGNOSIS — R197 Diarrhea, unspecified: Secondary | ICD-10-CM | POA: Diagnosis present

## 2016-01-10 DIAGNOSIS — O26893 Other specified pregnancy related conditions, third trimester: Secondary | ICD-10-CM | POA: Diagnosis present

## 2016-01-10 DIAGNOSIS — O4703 False labor before 37 completed weeks of gestation, third trimester: Secondary | ICD-10-CM

## 2016-01-10 DIAGNOSIS — O163 Unspecified maternal hypertension, third trimester: Secondary | ICD-10-CM | POA: Insufficient documentation

## 2016-01-10 DIAGNOSIS — O09893 Supervision of other high risk pregnancies, third trimester: Secondary | ICD-10-CM

## 2016-01-10 DIAGNOSIS — Z3A30 30 weeks gestation of pregnancy: Secondary | ICD-10-CM | POA: Diagnosis not present

## 2016-01-10 LAB — URINALYSIS, ROUTINE W REFLEX MICROSCOPIC
BILIRUBIN URINE: NEGATIVE
Glucose, UA: 100 mg/dL — AB
KETONES UR: NEGATIVE mg/dL
Leukocytes, UA: NEGATIVE
Nitrite: NEGATIVE
PH: 6 (ref 5.0–8.0)
Protein, ur: NEGATIVE mg/dL
SPECIFIC GRAVITY, URINE: 1.015 (ref 1.005–1.030)

## 2016-01-10 LAB — COMPREHENSIVE METABOLIC PANEL
ALK PHOS: 74 U/L (ref 38–126)
ALT: 14 U/L (ref 14–54)
AST: 31 U/L (ref 15–41)
Albumin: 3.3 g/dL — ABNORMAL LOW (ref 3.5–5.0)
Anion gap: 7 (ref 5–15)
BUN: 6 mg/dL (ref 6–20)
CALCIUM: 8.9 mg/dL (ref 8.9–10.3)
CO2: 22 mmol/L (ref 22–32)
CREATININE: 0.48 mg/dL (ref 0.44–1.00)
Chloride: 106 mmol/L (ref 101–111)
Glucose, Bld: 85 mg/dL (ref 65–99)
Potassium: 4.7 mmol/L (ref 3.5–5.1)
Sodium: 135 mmol/L (ref 135–145)
TOTAL PROTEIN: 6.3 g/dL — AB (ref 6.5–8.1)
Total Bilirubin: 1 mg/dL (ref 0.3–1.2)

## 2016-01-10 LAB — URINE MICROSCOPIC-ADD ON

## 2016-01-10 LAB — PROTEIN / CREATININE RATIO, URINE
CREATININE, URINE: 81 mg/dL
Protein Creatinine Ratio: 0.14 mg/mg{Cre} (ref 0.00–0.15)
Total Protein, Urine: 11 mg/dL

## 2016-01-10 LAB — CBC
HCT: 32.7 % — ABNORMAL LOW (ref 36.0–46.0)
HEMOGLOBIN: 10.5 g/dL — AB (ref 12.0–15.0)
MCH: 28.3 pg (ref 26.0–34.0)
MCHC: 32.1 g/dL (ref 30.0–36.0)
MCV: 88.1 fL (ref 78.0–100.0)
Platelets: 335 10*3/uL (ref 150–400)
RBC: 3.71 MIL/uL — AB (ref 3.87–5.11)
RDW: 14.4 % (ref 11.5–15.5)
WBC: 11.1 10*3/uL — ABNORMAL HIGH (ref 4.0–10.5)

## 2016-01-10 LAB — WET PREP, GENITAL
CLUE CELLS WET PREP: NONE SEEN
Sperm: NONE SEEN
Trich, Wet Prep: NONE SEEN
WBC WET PREP: NONE SEEN
Yeast Wet Prep HPF POC: NONE SEEN

## 2016-01-10 LAB — FETAL FIBRONECTIN: Fetal Fibronectin: NEGATIVE

## 2016-01-10 MED ORDER — LACTATED RINGERS IV BOLUS (SEPSIS)
1000.0000 mL | Freq: Once | INTRAVENOUS | Status: AC
  Administered 2016-01-10: 1000 mL via INTRAVENOUS

## 2016-01-10 MED ORDER — NIFEDIPINE 10 MG PO CAPS
20.0000 mg | ORAL_CAPSULE | Freq: Once | ORAL | Status: AC
  Administered 2016-01-10: 20 mg via ORAL
  Filled 2016-01-10: qty 2

## 2016-01-10 MED ORDER — NIFEDIPINE ER OSMOTIC RELEASE 30 MG PO TB24: 30.0000 mg | ORAL_TABLET | Freq: Every day | ORAL | 0 refills | Status: DC

## 2016-01-10 NOTE — MAU Note (Signed)
IV completed. Changed to saline lock. Patient states she is still feeling some cramping.

## 2016-01-10 NOTE — MAU Note (Signed)
Sharp pains started yesterday and menstrual-like cramps for about a week. Loose stools X 2 this AM. No bleeding or vaginal discharge.

## 2016-01-10 NOTE — MAU Provider Note (Signed)
Chief Complaint:  Abdominal Pain   HPI: Marissa Reese is a 35 y.o. G3P2002 at [redacted]w[redacted]d who presents to maternity admissions reporting cramping pain and diarrhea.  Reports diarrhea x2 yesterday, first with loose stools, second was watery stools. No more diarrhea after that. States about 2 days of cramping, described as menstrual-like, not sharp, not like contractions. No recent intercourse. Denies contractions, leakage of fluid or vaginal bleeding. Good fetal movement.   Pregnancy Course:  Chronic HTN (no meds)  Past Medical History: Past Medical History:  Diagnosis Date  . Anxiety   . Depression   . Essential hypertension    Medication since 04/2015  . History of headache   . Pregnant 07/17/2015  . Vaginal itching 08/19/2015  . Yeast infection 08/19/2015    Past obstetric history: OB History  Gravida Para Term Preterm AB Living  SAB TAB Ectopic Multiple Live Births          2    # Outcome Date GA Lbr Len/2nd Weight Sex Delivery Anes PTL Lv  3 Current           2 Term 12/21/09 [redacted]w[redacted]d  6 lb 11 oz (3.033 kg) F Vag-Spont Pud N LIV  1 Term 08/04/07 [redacted]w[redacted]d  5 lb 5 oz (2.41 kg) F Vag-Spont EPI N LIV      Past Surgical History: Past Surgical History:  Procedure Laterality Date  . CHOLECYSTECTOMY       Family History: Family History  Problem Relation Age of Onset  . Diabetes Father   . Heart attack Father     Age 73  . Hypertension Father   . Hypertension Mother   . Hyperlipidemia Mother   . Heart attack Maternal Grandfather   . Heart attack Paternal Grandmother   . Stroke Paternal Grandmother   . Heart attack Paternal Grandfather     Social History: Social History  Substance Use Topics  . Smoking status: Former Smoker    Packs/day: 0.00    Years: 17.00    Types: Cigarettes    Start date: 07/14/1991    Quit date: 07/13/2015  . Smokeless tobacco: Never Used  . Alcohol use No     Comment: Occasional; not now    Allergies:  Allergies  Allergen  Reactions  . Zolpidem Tartrate Other (See Comments)    Reaction:  Hallucinations   . Latex Itching and Rash    Meds:  Prescriptions Prior to Admission  Medication Sig Dispense Refill Last Dose  . aspirin EC 81 MG tablet Take 81 mg by mouth at bedtime.    01/09/2016 at 2330  . buPROPion (WELLBUTRIN XL) 300 MG 24 hr tablet Take 300 mg by mouth daily.   01/10/2016 at Unknown time  . ferrous sulfate 325 (65 FE) MG tablet Take 325 mg by mouth daily with breakfast.   01/10/2016 at Unknown time  . Prenat-FeFmCb-DSS-FA-DHA w/o A (CITRANATAL HARMONY) 27-1-260 MG CAPS Take 1 capsule by mouth 2 (two) times daily.   01/10/2016 at Unknown time  . omeprazole (PRILOSEC) 20 MG capsule Take 1 capsule (20 mg total) by mouth daily. 1 tablet a day (Patient not taking: Reported on 01/10/2016) 30 capsule 6     I have reviewed patient's Past Medical Hx, Surgical Hx, Family Hx, Social Hx, medications and allergies.   ROS:  Review of Systems Review of Systems - Negative except for in HPI  Physical Exam  Patient Vitals for the past 24 hrs:  BP Temp Temp src Pulse Resp Height Weight  01/10/16 1647 144/88 - - 102 - - -  01/10/16 1646 149/91 98.6 F (37 C) Oral 102 18 - -  01/10/16 1636 - - - - - 5' (1.524 m) 169 lb (76.7 kg)   Constitutional: Well-developed, well-nourished female in no acute distress.  Cardiovascular: normal rate Respiratory: normal effort GI: Abd soft, non-tender, gravid appropriate for gestational age. Pos BS x 4 MS: Extremities nontender, no edema, normal ROM Neurologic: Alert and oriented x 4.  GU: Neg CVAT.  Pelvic: NEFG, physiologic discharge, no blood, cervix clean. No CMT     FHT:  Baseline 130 , moderate variability, accelerations present, no decelerations Contractions: q2-4 mins but barely feeling (light), irritable.   Labs: Results for orders placed or performed during the hospital encounter of 01/10/16 (from the past 24 hour(s))  Urinalysis, Routine w reflex microscopic (not  at [facility])     Status: Abnormal   Collection Time: 01/10/16  4:30 PM  Result Value Ref Range   Color, Urine YELLOW YELLOW   APPearance CLEAR CLEAR   Specific Gravity, Urine 1.015 1.005 - 1.030   pH 6.0 5.0 - 8.0   Glucose, UA 100 (A) NEGATIVE mg/dL   Hgb urine dipstick TRACE (A) NEGATIVE   Bilirubin Urine NEGATIVE NEGATIVE   Ketones, ur NEGATIVE NEGATIVE mg/dL   Protein, ur NEGATIVE NEGATIVE mg/dL   Nitrite NEGATIVE NEGATIVE   Leukocytes, UA NEGATIVE NEGATIVE  Urine microscopic-add on     Status: Abnormal   Collection Time: 01/10/16  4:30 PM  Result Value Ref Range   Squamous Epithelial / LPF 6-30 (A) NONE SEEN   WBC, UA 0-5 0 - 5 WBC/hpf   RBC / HPF 0-5 0 - 5 RBC/hpf   Bacteria, UA FEW (A) NONE SEEN   Urine-Other MUCOUS PRESENT   Protein / creatinine ratio, urine     Status: None   Collection Time: 01/10/16  5:15 PM  Result Value Ref Range   Creatinine, Urine 81.00 mg/dL   Total Protein, Urine 11 mg/dL   Protein Creatinine Ratio 0.14 0.00 - 0.15 mg/mg[Cre]  CBC     Status: Abnormal   Collection Time: 01/10/16  5:33 PM  Result Value Ref Range   WBC 11.1 (H) 4.0 - 10.5 K/uL   RBC 3.71 (L) 3.87 - 5.11 MIL/uL   Hemoglobin 10.5 (L) 12.0 - 15.0 g/dL   HCT 12.4 (L) 58.0 - 99.8 %   MCV 88.1 78.0 - 100.0 fL   MCH 28.3 26.0 - 34.0 pg   MCHC 32.1 30.0 - 36.0 g/dL   RDW 33.8 25.0 - 53.9 %   Platelets 335 150 - 400 K/uL  Comprehensive metabolic panel     Status: Abnormal   Collection Time: 01/10/16  5:33 PM  Result Value Ref Range   Sodium 135 135 - 145 mmol/L   Potassium 4.7 3.5 - 5.1 mmol/L   Chloride 106 101 - 111 mmol/L   CO2 22 22 - 32 mmol/L   Glucose, Bld 85 65 - 99 mg/dL   BUN 6 6 - 20 mg/dL   Creatinine, Ser 7.67 0.44 - 1.00 mg/dL   Calcium 8.9 8.9 - 34.1 mg/dL   Total Protein 6.3 (L) 6.5 - 8.1 g/dL   Albumin 3.3 (L) 3.5 - 5.0 g/dL   AST 31 15 - 41 U/L   ALT 14 14 - 54 U/L   Alkaline Phosphatase 74 38 - 126 U/L   Total Bilirubin 1.0 0.3 -  1.2 mg/dL   GFR calc non  Af Amer >60 >60 mL/min   GFR calc Af Amer >60 >60 mL/min   Anion gap 7 5 - 15  Fetal fibronectin     Status: None   Collection Time: 01/10/16  7:15 PM  Result Value Ref Range   Fetal Fibronectin NEGATIVE NEGATIVE  Wet prep, genital     Status: None   Collection Time: 01/10/16  9:05 PM  Result Value Ref Range   Yeast Wet Prep HPF POC NONE SEEN NONE SEEN   Trich, Wet Prep NONE SEEN NONE SEEN   Clue Cells Wet Prep HPF POC NONE SEEN NONE SEEN   WBC, Wet Prep HPF POC NONE SEEN NONE SEEN   Sperm NONE SEEN     Imaging:  US Ob Follow Up  Result Date: 12/12/2015 FOLLOW UP SONOGRAM ERNEST ORR is in the office for a follow up sonogram for EFW. She is a 35 y.o. year old G37P2002 with Estimated Date of Delivery: 03/16/16 by LMP now at  [redacted]w[redacted]d weeks gestation. Thus far the pregnancy has been complicated by [facility].. GESTATION: SINGLETON PRESENTATION: cephalic FETAL ACTIVITY:          Heart rate         147          The fetus is active. AMNIOTIC FLUID: The amniotic fluid volume is  normal, 5.1 cm. PLACENTA LOCALIZATION:  posterior GRADE 0 CERVIX: Measures 3.4 cm ADNEXA: The ovaries are normal. GESTATIONAL AGE AND  BIOMETRICS: Gestational criteria: Estimated Date of Delivery: 03/16/16 by LMP now at [redacted]w[redacted]d Previous Scans:3          BIPARIETAL DIAMETER           6.56 cm         26+3 weeks HEAD CIRCUMFERENCE           24.78 cm         26+6 weeks ABDOMINAL CIRCUMFERENCE           22.31 cm         26+5 weeks FEMUR LENGTH           4.90 cm         26+3 weeks                                                       AVERAGE EGA(BY THIS SCAN):  26+5 weeks                                                 ESTIMATED FETAL WEIGHT:       987  grams, 56 % ANATOMICAL SURVEY                                                                            COMMENTS CEREBRAL VENTRICLES yes normal  CHOROID PLEXUS yes normal  CEREBELLUM yes normal  CISTERNA MAGNA yes normal  NUCHAL REGION yes normal  ORBITS yes normal  NASAL BONE yes normal   NOSE/LIP yes normal  FACIAL PROFILE yes normal  4 CHAMBERED HEART yes normal  OUTFLOW TRACTS yes normal  DIAPHRAGM yes normal  STOMACH yes normal  RENAL REGION yes normal  BLADDER yes normal  CORD INSERTION    3 VESSEL CORD yes normal  SPINE    ARMS/HANDS yes normal  LEGS/FEET yes normal  GENITALIA yes normal female     SUSPECTED ABNORMALITIES:  no QUALITY OF SCAN: satisfactory TECHNICIAN COMMENTS: [place] 26+3 wks,cephalic,cx 3.4 cm,normal ov's bilat,post pl gr 0,svp 5.1 cm,fhr 147 bpm,efw 987 g,56% A copy of this report including all images has been saved and backed up to a second source for retrieval if needed. All measures and details of the anatomical scan, placentation, fluid volume and pelvic anatomy are contained in that report. Amber Flora Lipps 12/12/2015 9:21 AM Clinical Impression and recommendations: I have reviewed the sonogram results above, combined with the patient's current clinical course, below are my impressions and any appropriate recommendations for management based on the sonographic findings. 1.  [id number] Estimated Date of Delivery: 03/16/16 by  LMP, early ultrasound, midtrimester ultrasound and confirmed by today's sonographic dating 2.  Normal fetal sonographic findings, specifically normal detailed anatomical evaluation,      no abnormalities noted 3.  Normal general sonographic findings Recommend routine prenatal care based on this sonogram or as clinically indicated EURE,LUTHER H 12/12/2015 9:35 AM    MAU Course: 5:08 PM - Patient seen & examined. IVF bolus to be given, will continue monitoring contractions. Reactive strip. FFN and SVE to be performed.  7:19 PM - FFN performed and sent, SVE was fingertip, 50/medium consistency, posterior, high. Contractions still occurring q5 min apart, not very strong. Will give a tocolytic while awaiting FFN.   8:34 PM - Discussed with patient result of negative FFN. Procardia that was given help to decrease contractions. Discussed with patient plan of care  and discharge medication of Procardia.   MDM:  Patient was not found to be in active preterm labor. Patient was having mild preterm contractions, tocolytic given with some resolve, to go home with PO tocolytic to be taken prn. FFN was negative, no need to give steroids, to follow up with OB with preterm labor precautions given. Discussed nothing PV until resolve of contractions.  Assessment: 1. Preterm contractions, third trimester   2. Supervision of other high-risk pregnancy, third trimester     Plan: Discharge home in stable condition.  Rx for procardia given. Preterm labor precautions and fetal kick counts given     Medication List    ASK your doctor about these medications   aspirin EC 81 MG tablet Take 81 mg by mouth at bedtime.   buPROPion 300 MG 24 hr tablet Commonly known as:  WELLBUTRIN XL Take 300 mg by mouth daily.   CITRANATAL HARMONY 27-1-260 MG Caps Take 1 capsule by mouth 2 (two) times daily.   ferrous sulfate 325 (65 FE) MG tablet Take 325 mg by mouth daily with breakfast.   omeprazole 20 MG capsule Commonly known as:  PRILOSEC Take 1 capsule (20 mg total) by mouth daily. 1 tablet a day       [facility], DO 01/10/2016 5:08 PM

## 2016-01-10 NOTE — Discharge Instructions (Signed)

## 2016-01-12 LAB — GC/CHLAMYDIA PROBE AMP (~~LOC~~) NOT AT ARMC
Chlamydia: NEGATIVE
Neisseria Gonorrhea: NEGATIVE

## 2016-01-13 ENCOUNTER — Telehealth: Payer: Self-pay | Admitting: Obstetrics & Gynecology

## 2016-01-13 ENCOUNTER — Encounter: Payer: Self-pay | Admitting: Obstetrics and Gynecology

## 2016-01-13 ENCOUNTER — Ambulatory Visit (INDEPENDENT_AMBULATORY_CARE_PROVIDER_SITE_OTHER): Payer: Medicaid Other | Admitting: Obstetrics and Gynecology

## 2016-01-13 VITALS — BP 138/90 | HR 90 | Wt 168.5 lb

## 2016-01-13 DIAGNOSIS — O368130 Decreased fetal movements, third trimester, not applicable or unspecified: Secondary | ICD-10-CM | POA: Diagnosis not present

## 2016-01-13 DIAGNOSIS — Z331 Pregnant state, incidental: Secondary | ICD-10-CM | POA: Diagnosis not present

## 2016-01-13 DIAGNOSIS — O0993 Supervision of high risk pregnancy, unspecified, third trimester: Secondary | ICD-10-CM

## 2016-01-13 DIAGNOSIS — Z1389 Encounter for screening for other disorder: Secondary | ICD-10-CM

## 2016-01-13 DIAGNOSIS — Z3A31 31 weeks gestation of pregnancy: Secondary | ICD-10-CM

## 2016-01-13 DIAGNOSIS — O10912 Unspecified pre-existing hypertension complicating pregnancy, second trimester: Secondary | ICD-10-CM | POA: Diagnosis not present

## 2016-01-13 LAB — POCT URINALYSIS DIPSTICK
Blood, UA: NEGATIVE
KETONES UA: NEGATIVE
LEUKOCYTES UA: NEGATIVE
NITRITE UA: NEGATIVE

## 2016-01-13 NOTE — Telephone Encounter (Signed)
Pt c/o decrease FM, states was seen at [facility] for contractions and given Procardia and sent home with preterm labor instructions. Pt states she has ate and drunk orange juice and did the kick count and feels like the baby did move 10 times/ 2 hours. Pt states she feels nervous with all that transpired. Pt given an appt this afternoon for evaluation of decrease FM per Dr.Eure.

## 2016-01-13 NOTE — Telephone Encounter (Signed)
Pt called stating that she has a question regarding fetal movement. Please contact pt

## 2016-01-13 NOTE — Progress Notes (Signed)
Pt worked in today for decreased fetal movement. Pt states that she is having pressure and some contractions.

## 2016-01-16 ENCOUNTER — Other Ambulatory Visit: Payer: Self-pay | Admitting: Obstetrics and Gynecology

## 2016-01-16 DIAGNOSIS — O10919 Unspecified pre-existing hypertension complicating pregnancy, unspecified trimester: Secondary | ICD-10-CM

## 2016-01-19 ENCOUNTER — Other Ambulatory Visit: Payer: Self-pay | Admitting: Obstetrics and Gynecology

## 2016-01-19 ENCOUNTER — Encounter: Payer: Self-pay | Admitting: Obstetrics and Gynecology

## 2016-01-19 ENCOUNTER — Ambulatory Visit (INDEPENDENT_AMBULATORY_CARE_PROVIDER_SITE_OTHER): Payer: Medicaid Other | Admitting: Obstetrics and Gynecology

## 2016-01-19 ENCOUNTER — Ambulatory Visit (INDEPENDENT_AMBULATORY_CARE_PROVIDER_SITE_OTHER): Payer: Medicaid Other

## 2016-01-19 VITALS — BP 118/74 | HR 93 | Wt 169.0 lb

## 2016-01-19 DIAGNOSIS — O10919 Unspecified pre-existing hypertension complicating pregnancy, unspecified trimester: Secondary | ICD-10-CM

## 2016-01-19 DIAGNOSIS — Z3A32 32 weeks gestation of pregnancy: Secondary | ICD-10-CM

## 2016-01-19 DIAGNOSIS — Z1389 Encounter for screening for other disorder: Secondary | ICD-10-CM | POA: Diagnosis not present

## 2016-01-19 DIAGNOSIS — O10913 Unspecified pre-existing hypertension complicating pregnancy, third trimester: Secondary | ICD-10-CM | POA: Diagnosis not present

## 2016-01-19 DIAGNOSIS — O09893 Supervision of other high risk pregnancies, third trimester: Secondary | ICD-10-CM

## 2016-01-19 DIAGNOSIS — Z331 Pregnant state, incidental: Secondary | ICD-10-CM

## 2016-01-19 DIAGNOSIS — O360131 Maternal care for anti-D [Rh] antibodies, third trimester, fetus 1: Secondary | ICD-10-CM

## 2016-01-19 LAB — POCT URINALYSIS DIPSTICK
Ketones, UA: NEGATIVE
Leukocytes, UA: NEGATIVE
Nitrite, UA: NEGATIVE
RBC UA: NEGATIVE

## 2016-01-19 NOTE — Progress Notes (Signed)
Patient ID: Marissa Reese, female   DOB: 05/06/1981, 35 y.o.   MRN: [id number]   High Risk Pregnancy HROB Diagnosis(es):   CHTN, no meds  G3P2002 9339w6d Estimated Date of Delivery: 03/16/16    HPI: The patient is being seen today for ongoing management of CHTN, no meds.  Pt states she experienced intermittent, non-regular, sharp, lower abdominal cramping last night. She reports her symptoms are worsened with prolonged periods of standing.   Patient reports good fetal movement, denies any bleeding and no rupture of membranes symptoms or regular contractions.   BP weight and urine results reviewed and noted. Blood pressure 118/74, pulse 93, weight 169 lb (76.7 kg), last menstrual period 06/10/2015.  Fundal Height:  33 cm Fetal Heart rate:  134 bpm Physical Examination: Abdomen - soft, nontender, nondistended, no masses or organomegaly                                     Edema:  none  Urinalysis:NEGATIVE for nitrite, leukocytes, ketones                  POSITIVE for trace glucose, trace protein   Fetal Surveillance Testing today:  BPP, cord doppler   Lab and sonogram results have been reviewed. Comments:  Normal; growth at 57%, RI 0.55 and 0.66, BPP 8/8  Assessment:  1.  Pregnancy at 6039w6d,  G3P2002   :  Estimated Date of Delivery: 03/16/16                         2.  CHTN, no meds                         3. Fetal wellbeing by BPP dopplers  Medication(s) Plans:  Continue baby ASA  Treatment Plan:  Growth u/s @ 34, 38wks     2x/wk testing nst/sono @ 32wks     Follow up as scheduled in 3 days for appointment for high risk OB care biweelkly testing/NST, in 2 weeks for growth US   By signing my name below, I, Doreatha MartinEva Mathews, attest that this documentation has been prepared under the direction and in the presence of Tilda BurrowJohn V Byrd Terrero, MD. Electronically Signed: Doreatha MartinEva Mathews, ED Scribe. 01/19/16. 11:45 AM.  I personally performed the services described in this documentation, which was  SCRIBED in my presence. The recorded information has been reviewed and considered accurate. It has been edited as necessary during review. Tilda BurrowFERGUSON,Tieasha Larsen V, MD

## 2016-01-19 NOTE — Progress Notes (Signed)
US 31+6 wks,cephalic,fhr 134 bpm,normal ov's bilat,post pl gr 1,afi 11.7 cm,RI .55,.66,BPP 8/8,EFW 1991 g 57%

## 2016-01-19 NOTE — Progress Notes (Signed)
Pt denies any problems or concerns at this time.  

## 2016-01-22 ENCOUNTER — Ambulatory Visit (INDEPENDENT_AMBULATORY_CARE_PROVIDER_SITE_OTHER): Payer: Medicaid Other | Admitting: Obstetrics & Gynecology

## 2016-01-22 ENCOUNTER — Encounter: Payer: Self-pay | Admitting: Obstetrics & Gynecology

## 2016-01-22 VITALS — BP 140/80 | HR 78 | Wt 170.0 lb

## 2016-01-22 DIAGNOSIS — Z3A33 33 weeks gestation of pregnancy: Secondary | ICD-10-CM

## 2016-01-22 DIAGNOSIS — O09893 Supervision of other high risk pregnancies, third trimester: Secondary | ICD-10-CM | POA: Diagnosis not present

## 2016-01-22 DIAGNOSIS — Z331 Pregnant state, incidental: Secondary | ICD-10-CM

## 2016-01-22 DIAGNOSIS — Z1389 Encounter for screening for other disorder: Secondary | ICD-10-CM

## 2016-01-22 DIAGNOSIS — O1092 Unspecified pre-existing hypertension complicating childbirth: Secondary | ICD-10-CM

## 2016-01-22 DIAGNOSIS — O10919 Unspecified pre-existing hypertension complicating pregnancy, unspecified trimester: Secondary | ICD-10-CM

## 2016-01-22 DIAGNOSIS — O360131 Maternal care for anti-D [Rh] antibodies, third trimester, fetus 1: Secondary | ICD-10-CM

## 2016-01-22 DIAGNOSIS — O10912 Unspecified pre-existing hypertension complicating pregnancy, second trimester: Secondary | ICD-10-CM | POA: Diagnosis not present

## 2016-01-22 LAB — POCT URINALYSIS DIPSTICK
Blood, UA: NEGATIVE
Glucose, UA: NEGATIVE
KETONES UA: NEGATIVE
LEUKOCYTES UA: NEGATIVE
Nitrite, UA: NEGATIVE
PROTEIN UA: NEGATIVE

## 2016-01-22 NOTE — Progress Notes (Signed)
Fetal Surveillance Testing today:  Reactive NST   High Risk Pregnancy Diagnosis(es):   Chronic hypertension on no meds currently except baby aspirin  G3P2002 4367w2d Estimated Date of Delivery: 03/16/16  Blood pressure 140/80, pulse 78, weight 170 lb (77.1 kg), last menstrual period 06/10/2015.  Urinalysis: Negative   HPI: The patient is being seen today for ongoing management of chronic hypertension. Today she reports has taken Procardia 1 for contractions   BP weight and urine results all reviewed and noted. Patient reports good fetal movement, denies any bleeding and no rupture of membranes symptoms or regular contractions.  Fundal Height:  33 Fetal Heart rate:  135 Edema:  None  Patient is without complaints other than noted in her HPI. All questions were answered.  All lab and sonogram results have been reviewed. Comments:    Assessment:  1.  Pregnancy at 2067w2d,  Estimated Date of Delivery: 03/16/16 :                          2.  Chronic hypertension                        3.  Threatened preterm labor  Medication(s) Plans:  Continue baby aspirin Procardia when necessary  Treatment Plan:  Twice weekly assessments with sonogram alternating with NST planned delivery at 39 weeks She will continue to collect blood pressures 4 times a day at home and record them and bring her blood cancer we can get a better idea of where she is living in general  Return in about 4 days (around 01/26/2016) for BPP/sono, HROB. for appointment for high risk OB care  No orders of the defined types were placed in this encounter.  Orders Placed This Encounter  Procedures  . US Fetal BPP W/O Non Stress  . US UA Cord Doppler  . POCT urinalysis dipstick

## 2016-01-26 ENCOUNTER — Ambulatory Visit (INDEPENDENT_AMBULATORY_CARE_PROVIDER_SITE_OTHER): Payer: Medicaid Other

## 2016-01-26 ENCOUNTER — Ambulatory Visit (INDEPENDENT_AMBULATORY_CARE_PROVIDER_SITE_OTHER): Payer: Medicaid Other | Admitting: Obstetrics & Gynecology

## 2016-01-26 VITALS — BP 144/84 | HR 96 | Wt 169.0 lb

## 2016-01-26 DIAGNOSIS — O09893 Supervision of other high risk pregnancies, third trimester: Secondary | ICD-10-CM

## 2016-01-26 DIAGNOSIS — O10912 Unspecified pre-existing hypertension complicating pregnancy, second trimester: Secondary | ICD-10-CM

## 2016-01-26 DIAGNOSIS — Z1389 Encounter for screening for other disorder: Secondary | ICD-10-CM

## 2016-01-26 DIAGNOSIS — Z3A33 33 weeks gestation of pregnancy: Secondary | ICD-10-CM | POA: Diagnosis not present

## 2016-01-26 DIAGNOSIS — O360131 Maternal care for anti-D [Rh] antibodies, third trimester, fetus 1: Secondary | ICD-10-CM

## 2016-01-26 DIAGNOSIS — Z331 Pregnant state, incidental: Secondary | ICD-10-CM | POA: Diagnosis not present

## 2016-01-26 DIAGNOSIS — O10919 Unspecified pre-existing hypertension complicating pregnancy, unspecified trimester: Secondary | ICD-10-CM

## 2016-01-26 LAB — POCT URINALYSIS DIPSTICK
GLUCOSE UA: NEGATIVE
Ketones, UA: NEGATIVE
LEUKOCYTES UA: NEGATIVE
NITRITE UA: NEGATIVE
RBC UA: NEGATIVE

## 2016-01-26 NOTE — Progress Notes (Signed)
US 32+6 wks,cephalic,fhr 145 bpm,BPP 8/8,post pl gr 1,normal ov's bilat,afi 12.6 cm,RI .56,.47

## 2016-01-26 NOTE — Progress Notes (Signed)
Fetal Surveillance Testing today:  sonogram   High Risk Pregnancy Diagnosis(es):   borderline chronic hypertension, no meds on ASA  G3P2002 41109w6d Estimated Date of Delivery: 03/16/16  Blood pressure (!) 144/84, pulse 96, weight 169 lb (76.7 kg), last menstrual period 06/10/2015.  Urinalysis: Negative   HPI: The patient is being seen today for ongoing management of borderline chronic hypertension. Today she reports some cramping, pelvic pressure, BP at home 130s/80s, didn't bring em in   BP weight and urine results all reviewed and noted. Patient reports good fetal movement, denies any bleeding and no rupture of membranes symptoms or regular contractions.  Fundal Height:  34 Fetal Heart rate:  145 Edema:  none  Patient is without complaints other than noted in her HPI. All questions were answered.  All lab and sonogram results have been reviewed. Comments:    Assessment:  1.  Pregnancy at 76109w6d,  Estimated Date of Delivery: 03/16/16 :                          2.  CHTN no meds on ASA, history pre eclampsia 1st pregnancy                        3.    Medication(s) Plans:  Procardia xl prn for contractions  Treatment Plan:  With BP not requiring meds, weekly evals ok, NST next week with visit  No Follow-up on file. for appointment for high risk OB care  No orders of the defined types were placed in this encounter.  Orders Placed This Encounter  Procedures  . POCT urinalysis dipstick

## 2016-02-02 ENCOUNTER — Ambulatory Visit (INDEPENDENT_AMBULATORY_CARE_PROVIDER_SITE_OTHER): Payer: Medicaid Other | Admitting: Obstetrics & Gynecology

## 2016-02-02 ENCOUNTER — Encounter: Payer: Self-pay | Admitting: Obstetrics & Gynecology

## 2016-02-02 VITALS — BP 140/90 | HR 90 | Wt 173.0 lb

## 2016-02-02 DIAGNOSIS — Z331 Pregnant state, incidental: Secondary | ICD-10-CM | POA: Diagnosis not present

## 2016-02-02 DIAGNOSIS — Z1389 Encounter for screening for other disorder: Secondary | ICD-10-CM | POA: Diagnosis not present

## 2016-02-02 DIAGNOSIS — O09893 Supervision of other high risk pregnancies, third trimester: Secondary | ICD-10-CM

## 2016-02-02 DIAGNOSIS — O10919 Unspecified pre-existing hypertension complicating pregnancy, unspecified trimester: Secondary | ICD-10-CM

## 2016-02-02 DIAGNOSIS — O10913 Unspecified pre-existing hypertension complicating pregnancy, third trimester: Secondary | ICD-10-CM

## 2016-02-02 DIAGNOSIS — O360191 Maternal care for anti-D [Rh] antibodies, unspecified trimester, fetus 1: Secondary | ICD-10-CM | POA: Diagnosis not present

## 2016-02-02 DIAGNOSIS — Z3A34 34 weeks gestation of pregnancy: Secondary | ICD-10-CM

## 2016-02-02 LAB — POCT URINALYSIS DIPSTICK
GLUCOSE UA: NEGATIVE
KETONES UA: NEGATIVE
Leukocytes, UA: NEGATIVE
Nitrite, UA: NEGATIVE
PROTEIN UA: NEGATIVE
RBC UA: NEGATIVE

## 2016-02-02 NOTE — Progress Notes (Signed)
Fetal Surveillance Testing today:  Reactive NST   High Risk Pregnancy Diagnosis(es):   Chronic hypertension  G3P2002 5920w6d Estimated Date of Delivery: 03/16/16  Blood pressure 140/90, pulse 90, weight 173 lb (78.5 kg), last menstrual period 06/10/2015.  Urinalysis: Negative   HPI: The patient is being seen today for ongoing management of chronic hypertension no meds. Today she reports BP log at home is trenidng up with a couple of values that would indicate may needs meds   BP weight and urine results all reviewed and noted. Patient reports good fetal movement, denies any bleeding and no rupture of membranes symptoms or regular contractions.  Fundal Height:  34 Fetal Heart rate:  140 Edema:  none  Patient is without complaints other than noted in her HPI. All questions were answered.  All lab and sonogram results have been reviewed. Comments: abnormal: BP log   Assessment:  1.  Pregnancy at 1320w6d,  Estimated Date of Delivery: 03/16/16 :                          2.  Chronic hypertension, baby ASA otherwise no meds                        3.    Medication(s) Plans:  Baby ASA  Treatment Plan:  Bump up to twice weekly, anticipate beginning meds in near future, will continue to keep log at home and base starting meds on that  Return in about 3 days (around 02/05/2016). for appointment for high risk OB care  No orders of the defined types were placed in this encounter.  Orders Placed This Encounter  Procedures  . POCT urinalysis dipstick

## 2016-02-03 ENCOUNTER — Inpatient Hospital Stay (HOSPITAL_COMMUNITY)
Admission: AD | Admit: 2016-02-03 | Discharge: 2016-02-04 | Disposition: A | Discharge status: Home / Self Care | Payer: Medicaid Other | Source: Ambulatory Visit | Attending: Family Medicine | Admitting: Family Medicine

## 2016-02-03 DIAGNOSIS — R1030 Lower abdominal pain, unspecified: Secondary | ICD-10-CM | POA: Diagnosis not present

## 2016-02-03 DIAGNOSIS — O09893 Supervision of other high risk pregnancies, third trimester: Secondary | ICD-10-CM

## 2016-02-03 DIAGNOSIS — O26893 Other specified pregnancy related conditions, third trimester: Secondary | ICD-10-CM | POA: Insufficient documentation

## 2016-02-03 DIAGNOSIS — Z7982 Long term (current) use of aspirin: Secondary | ICD-10-CM | POA: Insufficient documentation

## 2016-02-03 DIAGNOSIS — Z79899 Other long term (current) drug therapy: Secondary | ICD-10-CM | POA: Insufficient documentation

## 2016-02-03 DIAGNOSIS — F329 Major depressive disorder, single episode, unspecified: Secondary | ICD-10-CM | POA: Insufficient documentation

## 2016-02-03 DIAGNOSIS — Z87891 Personal history of nicotine dependence: Secondary | ICD-10-CM | POA: Insufficient documentation

## 2016-02-03 DIAGNOSIS — I10 Essential (primary) hypertension: Secondary | ICD-10-CM | POA: Diagnosis present

## 2016-02-03 DIAGNOSIS — F419 Anxiety disorder, unspecified: Secondary | ICD-10-CM | POA: Diagnosis not present

## 2016-02-03 DIAGNOSIS — O163 Unspecified maternal hypertension, third trimester: Secondary | ICD-10-CM | POA: Diagnosis not present

## 2016-02-03 DIAGNOSIS — O99343 Other mental disorders complicating pregnancy, third trimester: Secondary | ICD-10-CM | POA: Insufficient documentation

## 2016-02-03 DIAGNOSIS — Z3A34 34 weeks gestation of pregnancy: Secondary | ICD-10-CM | POA: Diagnosis not present

## 2016-02-03 DIAGNOSIS — O10913 Unspecified pre-existing hypertension complicating pregnancy, third trimester: Secondary | ICD-10-CM

## 2016-02-04 ENCOUNTER — Encounter (HOSPITAL_COMMUNITY): Payer: Self-pay

## 2016-02-04 LAB — PROTEIN / CREATININE RATIO, URINE
CREATININE, URINE: 97 mg/dL
Protein Creatinine Ratio: 0.21 mg/mg{Cre} — ABNORMAL HIGH (ref 0.00–0.15)
TOTAL PROTEIN, URINE: 20 mg/dL

## 2016-02-04 LAB — CBC
HCT: 33.9 % — ABNORMAL LOW (ref 36.0–46.0)
Hemoglobin: 11.1 g/dL — ABNORMAL LOW (ref 12.0–15.0)
MCH: 29 pg (ref 26.0–34.0)
MCHC: 32.7 g/dL (ref 30.0–36.0)
MCV: 88.5 fL (ref 78.0–100.0)
PLATELETS: 301 10*3/uL (ref 150–400)
RBC: 3.83 MIL/uL — AB (ref 3.87–5.11)
RDW: 15.6 % — ABNORMAL HIGH (ref 11.5–15.5)
WBC: 10.7 10*3/uL — ABNORMAL HIGH (ref 4.0–10.5)

## 2016-02-04 LAB — URINALYSIS, ROUTINE W REFLEX MICROSCOPIC
BILIRUBIN URINE: NEGATIVE
GLUCOSE, UA: 500 mg/dL — AB
Ketones, ur: NEGATIVE mg/dL
Leukocytes, UA: NEGATIVE
NITRITE: NEGATIVE
PH: 6 (ref 5.0–8.0)
Protein, ur: NEGATIVE mg/dL
SPECIFIC GRAVITY, URINE: 1.02 (ref 1.005–1.030)

## 2016-02-04 LAB — COMPREHENSIVE METABOLIC PANEL
ALK PHOS: 82 U/L (ref 38–126)
ALT: 13 U/L — AB (ref 14–54)
ANION GAP: 7 (ref 5–15)
AST: 15 U/L (ref 15–41)
Albumin: 3.4 g/dL — ABNORMAL LOW (ref 3.5–5.0)
BUN: 5 mg/dL — ABNORMAL LOW (ref 6–20)
CALCIUM: 8.8 mg/dL — AB (ref 8.9–10.3)
CHLORIDE: 106 mmol/L (ref 101–111)
CO2: 24 mmol/L (ref 22–32)
CREATININE: 0.5 mg/dL (ref 0.44–1.00)
Glucose, Bld: 113 mg/dL — ABNORMAL HIGH (ref 65–99)
Potassium: 3.3 mmol/L — ABNORMAL LOW (ref 3.5–5.1)
Sodium: 137 mmol/L (ref 135–145)
Total Bilirubin: 0.2 mg/dL — ABNORMAL LOW (ref 0.3–1.2)
Total Protein: 6.4 g/dL — ABNORMAL LOW (ref 6.5–8.1)

## 2016-02-04 LAB — URINE MICROSCOPIC-ADD ON

## 2016-02-04 MED ORDER — ACETAMINOPHEN 325 MG PO TABS
650.0000 mg | ORAL_TABLET | Freq: Once | ORAL | Status: AC
  Administered 2016-02-04: 650 mg via ORAL
  Filled 2016-02-04: qty 2

## 2016-02-04 NOTE — Discharge Instructions (Signed)
Please follow up with your provider on Friday of this week.   Preeclampsia and Eclampsia Preeclampsia is a serious condition that develops only during pregnancy. It is also called toxemia of pregnancy. This condition causes high blood pressure along with other symptoms, such as swelling and headaches. These may develop as the condition gets worse. Preeclampsia may occur 20 weeks or later into your pregnancy.  Diagnosing and treating preeclampsia early is very important. If not treated early, it can cause serious problems for you and your baby. One problem it can lead to is eclampsia, which is a condition that causes muscle jerking or shaking (convulsions) in the mother. Delivering your baby is the best treatment for preeclampsia or eclampsia.  RISK FACTORS The cause of preeclampsia is not known. You may be more likely to develop preeclampsia if you have certain risk factors. These include:   Being pregnant for the first time.  Having preeclampsia in a past pregnancy.  Having a family history of preeclampsia.  Having high blood pressure.  Being pregnant with twins or triplets.  Being 3235 or older.  Being African American.  Having kidney disease or diabetes.  Having medical conditions such as lupus or blood diseases.  Being very overweight (obese). SIGNS AND SYMPTOMS  The earliest signs of preeclampsia are:  High blood pressure.  Increased protein in your urine. Your health care provider will check for this at every prenatal visit. Other symptoms that can develop include:   Severe headaches.  Sudden weight gain.  Swelling of your hands, face, legs, and feet.  Feeling sick to your stomach (nauseous) and throwing up (vomiting).  Vision problems (blurred or double vision).  Numbness in your face, arms, legs, and feet.  Dizziness.  Slurred speech.  Sensitivity to bright lights.  Abdominal pain. DIAGNOSIS  There are no screening tests for preeclampsia. Your health care  provider will ask you about symptoms and check for signs of preeclampsia during your prenatal visits. You may also have tests, including:  Urine testing.  Blood testing.  Checking your baby's heart rate.  Checking the health of your baby and your placenta using images created with sound waves (ultrasound). TREATMENT  You can work out the best treatment approach together with your health care provider. It is very important to keep all prenatal appointments. If you have an increased risk of preeclampsia, you may need more frequent prenatal exams.  Your health care provider may prescribe bed rest.  You may have to eat as little salt as possible.  You may need to take medicine to lower your blood pressure if the condition does not respond to more conservative measures.  You may need to stay in the hospital if your condition is severe. There, treatment will focus on controlling your blood pressure and fluid retention. You may also need to take medicine to prevent seizures.  If the condition gets worse, your baby may need to be delivered early to protect you and the baby. You may have your labor started with medicine (be induced), or you may have a cesarean delivery.  Preeclampsia usually goes away after the baby is born. HOME CARE INSTRUCTIONS   Only take over-the-counter or prescription medicines as directed by your health care provider.  Lie on your left side while resting. This keeps pressure off your baby.  Elevate your feet while resting.  Get regular exercise. Ask your health care provider what type of exercise is safe for you.  Avoid caffeine and alcohol.  Do not smoke.  Drink 6-8 glasses of water every day.  Eat a balanced diet that is low in salt. Do not add salt to your food.  Avoid stressful situations as much as possible.  Get plenty of rest and sleep.  Keep all prenatal appointments and tests as scheduled. SEEK MEDICAL CARE IF:  You are gaining more weight than  expected.  You have any headaches, abdominal pain, or nausea.  You are bruising more than usual.  You feel dizzy or light-headed. SEEK IMMEDIATE MEDICAL CARE IF:   You develop sudden or severe swelling anywhere in your body. This usually happens in the legs.  You gain 5 lb (2.3 kg) or more in a week.  You have a severe headache, dizziness, problems with your vision, or confusion.  You have severe abdominal pain.  You have lasting nausea or vomiting.  You have a seizure.  You have trouble moving any part of your body.  You develop numbness in your body.  You have trouble speaking.  You have any abnormal bleeding.  You develop a stiff neck.  You pass out. MAKE SURE YOU:   Understand these instructions.  Will watch your condition.  Will get help right away if you are not doing well or get worse.   This information is not intended to replace advice given to you by your health care provider. Make sure you discuss any questions you have with your health care provider.   Document Released: 05/28/2000 Document Revised: 06/05/2013 Document Reviewed: 03/23/2013 Elsevier Interactive Patient Education [employer]2016 [employer]

## 2016-02-04 NOTE — MAU Provider Note (Signed)
None     Chief Complaint:  Hypertension and Contractions   Marissa Reese is  35 y.o. G3P2002 at 3087w1d presents complaining of Hypertension and Contractions Patient with hx of chronic hypertension. She has been instructed to check her blood pressure 4 times daily. Tonight she noted her blood pressure was 158/99 and decided to come the the MAU to get checked out. Previously patient reports having a blood pressure of 160/100 on Sunday night. However she had an appointment with her OB the next day and was therefore not worried.  Patient is not currently on any medications. Patient endorses having today which is dull in nature. She states the headache started midday today and is still present. She endorses any episode of blurry vision which lasted for a couple of seconds. No RUQ pain. No swelling in legs or arms.   Patient endorses ongoing lower abdominal pain for several weeks,noted having irregular contractions 30-45 minutes apart early in the day ;Contractions became more frequently, not sure how frequent. She has been seen previously for the lower abdominal pain and was prescribed procardia. However, she has not taken this medication.  No nausea or vomiting. Fever or chills.  Patient one loose stool bowel movement.    Hx of preeclampsia and A1GDM with first and second child respectively.   She states irregular, unsure of freq contractions are associated with none vaginal bleeding, intact membranes, along with active fetal movement.   Obstetrical/Gynecological History: OB History    Gravida Para Term Preterm AB Living   3 2 2     2    SAB TAB Ectopic Multiple Live Births           2     Past Medical History: Past Medical History:  Diagnosis Date  . Anxiety   . Depression   . Essential hypertension    Medication since 04/2015  . History of headache   . Hypertension    chronic, was on Lisinopril prior to preg.  . Pregnant 07/17/2015  . Vaginal itching 08/19/2015  . Yeast infection  08/19/2015    Past Surgical History: Past Surgical History:  Procedure Laterality Date  . CHOLECYSTECTOMY      Family History: Family History  Problem Relation Age of Onset  . Diabetes Father   . Heart attack Father     Age 34  . Hypertension Father   . Hypertension Mother   . Hyperlipidemia Mother   . Heart attack Maternal Grandfather   . Heart attack Paternal Grandmother   . Stroke Paternal Grandmother   . Heart attack Paternal Grandfather     Social History: Social History  Substance Use Topics  . Smoking status: Former Smoker    Packs/day: 0.00    Years: 17.00    Types: Cigarettes    Start date: 07/14/1991    Quit date: 07/13/2015  . Smokeless tobacco: Never Used  . Alcohol use No     Comment: Occasional; not now    Allergies:  Allergies  Allergen Reactions  . Zolpidem Tartrate Other (See Comments)    Reaction:  Hallucinations   . Latex Itching and Rash    Meds:  Prescriptions Prior to Admission  Medication Sig Dispense Refill Last Dose  . aspirin EC 81 MG tablet Take 81 mg by mouth at bedtime.    Taking  . buPROPion (WELLBUTRIN XL) 300 MG 24 hr tablet Take 300 mg by mouth daily.   Taking  . ferrous sulfate 325 (65 FE) MG tablet Take 325  mg by mouth daily with breakfast.   Taking  . NIFEdipine (PROCARDIA-XL/ADALAT-CC/NIFEDICAL-XL) 30 MG 24 hr tablet Take 1 tablet (30 mg total) by mouth daily. For 2 days then 1 tablet PO qday prn for mild contractions. 7 tablet 0 Taking  . Prenat-FeFmCb-DSS-FA-DHA w/o A (CITRANATAL HARMONY) 27-1-260 MG CAPS Take 1 capsule by mouth 2 (two) times daily.   Taking    Review of Systems   Constitutional: Negative for fever and chills Eyes: Negative for visual disturbances Respiratory: Negative for shortness of breath, dyspnea Cardiovascular: Negative for chest pain or palpitations  Gastrointestinal: Negative for vomiting, diarrhea and constipation Genitourinary: Negative for dysuria and urgency Musculoskeletal: Negative for  back pain, joint pain, myalgias.  Normal ROM  Neurological: Negative for dizziness and headaches    Physical Exam  Blood pressure 145/96, pulse 105, temperature 98.3 F (36.8 C), resp. rate 18, last menstrual period 06/10/2015. GENERAL: Well-developed, well-nourished female in no acute distress.  ABDOMEN: Soft, nontender, nondistended, gravid.  EXTREMITIES: Nontender, no edema, 2+ distal pulses. FHT:  Baseline rate 140  bpm   Variability moderate  Accelerations present   Decelerations none   Labs: No results found for this or any previous visit (from the past 24 hour(s)). Imaging Studies:  US Ob Follow Up  Result Date: 01/19/2016 FOLLOW UP SONOGRAM Marissa Reese is in the office for a follow up sonogram for BPP,EFW, and cord doppler. She is a 35 y.o. year old G66P2002 with Estimated Date of Delivery: 03/16/16 by LMP now at  [redacted]w[redacted]d weeks gestation. Thus far the pregnancy has been complicated by chtn. GESTATION: SINGLETON PRESENTATION: cephalic FETAL ACTIVITY:          Heart rate         134          The fetus is active. AMNIOTIC FLUID: The amniotic fluid volume is  normal, 11.7 cm. PLACENTA LOCALIZATION:  posterior GRADE 1 CERVIX: Limited view ADNEXA: The ovaries are normal. GESTATIONAL AGE AND  BIOMETRICS: Gestational criteria: Estimated Date of Delivery: 03/16/16 by LMP now at [redacted]w[redacted]d Previous Scans:4          BIPARIETAL DIAMETER           7.95 cm         31+6 weeks HEAD CIRCUMFERENCE           29.72 cm         32+6 weeks ABDOMINAL CIRCUMFERENCE           28.50 cm         32+4 weeks FEMUR LENGTH           6.14 cm         31+6 weeks                                                       AVERAGE EGA(BY THIS SCAN):  32+2 weeks                                                 ESTIMATED FETAL WEIGHT:       1991  grams, 57 % BIOPHYSCIAL PROFILE:  COMMENTS GROSS BODY MOVEMENT                 2  TONE                 2  RESPIRATIONS                2  AMNIOTIC FLUID                2                                                          SCORE:  8/8 (Note: NST was not performed as part of this antepartum testing) DOPPLER FLOW STUDIES: UMBILICAL ARTERY RI RATIOS:   0.55, 0.66 ANATOMICAL SURVEY                                                                            COMMENTS CEREBRAL VENTRICLES yes normal  CHOROID PLEXUS yes normal  CEREBELLUM yes normal  CISTERNA MAGNA    NUCHAL REGION    ORBITS    NASAL BONE    NOSE/LIP yes normal  FACIAL PROFILE yes normal  4 CHAMBERED HEART yes normal  OUTFLOW TRACTS yes normal  DIAPHRAGM yes normal  STOMACH yes normal  RENAL REGION yes normal  BLADDER yes normal  CORD INSERTION    3 VESSEL CORD yes normal  SPINE    ARMS/HANDS    LEGS/FEET    GENITALIA yes normal female     SUSPECTED ABNORMALITIES:  no QUALITY OF SCAN: satisfactory TECHNICIAN COMMENTS: [place] 31+6 wks,cephalic,fhr 134 bpm,normal ov's bilat,post pl gr 1,afi 11.7 cm,RI .55,.66,BPP 8/8,EFW 1991 g 57% A copy of this report including all images has been saved and backed up to a second source for retrieval if needed. All measures and details of the anatomical scan, placentation, fluid volume and pelvic anatomy are contained in that report. Marissa Reese 01/19/2016 10:53 AM   US Fetal Bpp W/o Non Stress  Result Date: 01/26/2016 FOLLOW UP SONOGRAM Marissa Reese is in the office for a follow up sonogram for BPP and cord doppler. She is a 35 y.o. year old G26P2002 with Estimated Date of Delivery: 03/16/16 by LMP now at  [redacted]w[redacted]d weeks gestation. Thus far the pregnancy has been complicated by CHTN,smoker.. GESTATION: SINGLETON PRESENTATION: cephalic FETAL ACTIVITY:          Heart rate         145          The fetus is active. AMNIOTIC FLUID: The amniotic fluid volume is  normal, 12.6 cm. PLACENTA LOCALIZATION:  posterior GRADE 1 CERVIX: Limited view ADNEXA: The ovaries are normal. GESTATIONAL AGE AND  BIOMETRICS: Gestational  criteria: Estimated Date of Delivery: 03/16/16 by LMP now at [redacted]w[redacted]d Previous Scans:5 BIOPHYSCIAL PROFILE:  COMMENTS GROSS BODY MOVEMENT                 2  TONE                2  RESPIRATIONS                2  AMNIOTIC FLUID                2                                                          SCORE:  8/8 (Note: NST was not performed as part of this antepartum testing) DOPPLER FLOW STUDIES: UMBILICAL ARTERY RI RATIOS:   0.56, 0.47 ANATOMICAL SURVEY                                                                            COMMENTS CEREBRAL VENTRICLES yes normal  CHOROID PLEXUS yes normal  CEREBELLUM yes normal  CISTERNA MAGNA    NUCHAL REGION    ORBITS    NASAL BONE    NOSE/LIP    FACIAL PROFILE    4 CHAMBERED HEART yes normal  OUTFLOW TRACTS yes normal  DIAPHRAGM yes normal  STOMACH yes normal  RENAL REGION yes normal  BLADDER yes normal  CORD INSERTION    3 VESSEL CORD yes normal  SPINE    ARMS/HANDS    LEGS/FEET    GENITALIA   female     SUSPECTED ABNORMALITIES:  no QUALITY OF SCAN: satisfactory TECHNICIAN COMMENTS: [place] 32+6 wks,cephalic,fhr 145 bpm,BPP 8/8,post pl gr 1,normal ov's bilat,afi 12.6 cm,RI .56,.47 A copy of this report including all images has been saved and backed up to a second source for retrieval if needed. All measures and details of the anatomical scan, placentation, fluid volume and pelvic anatomy are contained in that report. Marissa Flora LippsJ Carl 01/26/2016 12:17 PM   [place] Fetal Bpp W/o Non Stress  Result Date: 01/19/2016 FOLLOW UP SONOGRAM Marissa Reese is in the office for a follow up sonogram for BPP,EFW, and cord doppler. She is a 35 y.o. year old 533P2002 with Estimated Date of Delivery: 03/16/16 by LMP now at  5191w6d weeks gestation. Thus far the pregnancy has been complicated by chtn. GESTATION: SINGLETON PRESENTATION: cephalic FETAL ACTIVITY:          Heart rate         134          The fetus  is active. AMNIOTIC FLUID: The amniotic fluid volume is  normal, 11.7 cm. PLACENTA LOCALIZATION:  posterior GRADE 1 CERVIX: Limited view ADNEXA: The ovaries are normal. GESTATIONAL AGE AND  BIOMETRICS: Gestational criteria: Estimated Date of Delivery: 03/16/16 by LMP now at 5991w6d Previous Scans:4          BIPARIETAL DIAMETER           7.95 cm         31+6 weeks HEAD CIRCUMFERENCE  29.72 cm         32+6 weeks ABDOMINAL CIRCUMFERENCE           28.50 cm         32+4 weeks FEMUR LENGTH           6.14 cm         31+6 weeks                                                       AVERAGE EGA(BY THIS SCAN):  32+2 weeks                                                 ESTIMATED FETAL WEIGHT:       1991  grams, 57 % BIOPHYSCIAL PROFILE:                                                                                                      COMMENTS GROSS BODY MOVEMENT                 2  TONE                2  RESPIRATIONS                2  AMNIOTIC FLUID                2                                                          SCORE:  8/8 (Note: NST was not performed as part of this antepartum testing) DOPPLER FLOW STUDIES: UMBILICAL ARTERY RI RATIOS:   0.55, 0.66 ANATOMICAL SURVEY                                                                            COMMENTS CEREBRAL VENTRICLES yes normal  CHOROID PLEXUS yes normal  CEREBELLUM yes normal  CISTERNA MAGNA    NUCHAL REGION    ORBITS    NASAL BONE    NOSE/LIP yes normal  FACIAL PROFILE yes normal  4 CHAMBERED HEART yes normal  OUTFLOW TRACTS yes normal  DIAPHRAGM yes normal  STOMACH yes normal  RENAL REGION yes normal  BLADDER yes normal  CORD INSERTION    3 VESSEL CORD  yes normal  SPINE    ARMS/HANDS    LEGS/FEET    GENITALIA yes normal female     SUSPECTED ABNORMALITIES:  no QUALITY OF SCAN: satisfactory TECHNICIAN COMMENTS: [place] 31+6 wks,cephalic,fhr 134 bpm,normal ov's bilat,post pl gr 1,afi 11.7 cm,RI .55,.66,BPP 8/8,EFW 1991 g 57% A copy of this report including all  images has been saved and backed up to a second source for retrieval if needed. All measures and details of the anatomical scan, placentation, fluid volume and pelvic anatomy are contained in that report. Marissa Reese 01/19/2016 10:53 AM   [place] Ua Cord Doppler  Result Date: 01/26/2016 FOLLOW UP SONOGRAM Marissa Reese is in the office for a follow up sonogram for BPP and cord doppler. She is a 35 y.o. year old G25P2002 with Estimated Date of Delivery: 03/16/16 by LMP now at  [redacted]w[redacted]d weeks gestation. Thus far the pregnancy has been complicated by CHTN,smoker.. GESTATION: SINGLETON PRESENTATION: cephalic FETAL ACTIVITY:          Heart rate         145          The fetus is active. AMNIOTIC FLUID: The amniotic fluid volume is  normal, 12.6 cm. PLACENTA LOCALIZATION:  posterior GRADE 1 CERVIX: Limited view ADNEXA: The ovaries are normal. GESTATIONAL AGE AND  BIOMETRICS: Gestational criteria: Estimated Date of Delivery: 03/16/16 by LMP now at [redacted]w[redacted]d Previous Scans:5 BIOPHYSCIAL PROFILE:                                                                                                      COMMENTS GROSS BODY MOVEMENT                 2  TONE                2  RESPIRATIONS                2  AMNIOTIC FLUID                2                                                          SCORE:  8/8 (Note: NST was not performed as part of this antepartum testing) DOPPLER FLOW STUDIES: UMBILICAL ARTERY RI RATIOS:   0.56, 0.47 ANATOMICAL SURVEY                                                                            COMMENTS CEREBRAL VENTRICLES yes normal  CHOROID PLEXUS yes normal  CEREBELLUM yes normal  CISTERNA MAGNA  NUCHAL REGION    ORBITS    NASAL BONE    NOSE/LIP    FACIAL PROFILE    4 CHAMBERED HEART yes normal  OUTFLOW TRACTS yes normal  DIAPHRAGM yes normal  STOMACH yes normal  RENAL REGION yes normal  BLADDER yes normal  CORD INSERTION    3 VESSEL CORD yes normal  SPINE    ARMS/HANDS    LEGS/FEET    GENITALIA   female      SUSPECTED ABNORMALITIES:  no QUALITY OF SCAN: satisfactory TECHNICIAN COMMENTS: [place] 32+6 wks,cephalic,fhr 145 bpm,BPP 8/8,post pl gr 1,normal ov's bilat,afi 12.6 cm,RI .56,.47 A copy of this report including all images has been saved and backed up to a second source for retrieval if needed. All measures and details of the anatomical scan, placentation, fluid volume and pelvic anatomy are contained in that report. Marissa Reese 01/26/2016 12:17 PM   [place] Ua Cord Doppler  Result Date: 01/19/2016 FOLLOW UP SONOGRAM Marissa Reese is in the office for a follow up sonogram for BPP,EFW, and cord doppler. She is a 35 y.o. year old G47P2002 with Estimated Date of Delivery: 03/16/16 by LMP now at  [redacted]w[redacted]d weeks gestation. Thus far the pregnancy has been complicated by chtn. GESTATION: SINGLETON PRESENTATION: cephalic FETAL ACTIVITY:          Heart rate         134          The fetus is active. AMNIOTIC FLUID: The amniotic fluid volume is  normal, 11.7 cm. PLACENTA LOCALIZATION:  posterior GRADE 1 CERVIX: Limited view ADNEXA: The ovaries are normal. GESTATIONAL AGE AND  BIOMETRICS: Gestational criteria: Estimated Date of Delivery: 03/16/16 by LMP now at [redacted]w[redacted]d Previous Scans:4          BIPARIETAL DIAMETER           7.95 cm         31+6 weeks HEAD CIRCUMFERENCE           29.72 cm         32+6 weeks ABDOMINAL CIRCUMFERENCE           28.50 cm         32+4 weeks FEMUR LENGTH           6.14 cm         31+6 weeks                                                       AVERAGE EGA(BY THIS SCAN):  32+2 weeks                                                 ESTIMATED FETAL WEIGHT:       1991  grams, 57 % BIOPHYSCIAL PROFILE:  COMMENTS GROSS BODY MOVEMENT                 2  TONE                2  RESPIRATIONS                2  AMNIOTIC FLUID                2                                                          SCORE:  8/8 (Note: NST was  not performed as part of this antepartum testing) DOPPLER FLOW STUDIES: UMBILICAL ARTERY RI RATIOS:   0.55, 0.66 ANATOMICAL SURVEY                                                                            COMMENTS CEREBRAL VENTRICLES yes normal  CHOROID PLEXUS yes normal  CEREBELLUM yes normal  CISTERNA MAGNA    NUCHAL REGION    ORBITS    NASAL BONE    NOSE/LIP yes normal  FACIAL PROFILE yes normal  4 CHAMBERED HEART yes normal  OUTFLOW TRACTS yes normal  DIAPHRAGM yes normal  STOMACH yes normal  RENAL REGION yes normal  BLADDER yes normal  CORD INSERTION    3 VESSEL CORD yes normal  SPINE    ARMS/HANDS    LEGS/FEET    GENITALIA yes normal female     SUSPECTED ABNORMALITIES:  no QUALITY OF SCAN: satisfactory TECHNICIAN COMMENTS: [place] 31+6 wks,cephalic,fhr 134 bpm,normal ov's bilat,post pl gr 1,afi 11.7 cm,RI .55,.66,BPP 8/8,EFW 1991 g 57% A copy of this report including all images has been saved and backed up to a second source for retrieval if needed. All measures and details of the anatomical scan, placentation, fluid volume and pelvic anatomy are contained in that report. Marissa Reese 01/19/2016 10:53 AM    Assessment: Marissa Reese is  34 y.o. G3P2002 at [redacted]w[redacted]d presents with elevated blood pressure   Plan: - Orders include Protein/Creatinine, CBC, UA, CMET,  - Monitor Blood pressures.  - No signs of preeclampsia on lab review  - Headache resolved with tylenol  - patient to follow up with OB on Friday  - Return precautions provided   Marissa Reese 8/23/201712:20 AM   OB FELLOW MAU DISCHARGE ATTESTATION  I have seen and examined this patient; I agree with above documentation in the resident's note.    Marissa Mow, DO OB Fellow 4:50 AM

## 2016-02-04 NOTE — MAU Note (Signed)
Pt presents stating she checked her BP at home and it was 150s/100s. Has a headache but has not tried any pain medication. Denies visual changes at this time. Also having increased contractions.

## 2016-02-04 NOTE — Progress Notes (Signed)
MD called to check on pt. Notified that pt is feeling better after tylenol. Ok to discharge home

## 2016-02-04 NOTE — Progress Notes (Signed)
Notified of pt arrival in MAU and complaint. Will come see pt 

## 2016-02-04 NOTE — Progress Notes (Signed)
Called to notify that pt was feeling better after tylenol. In delivery.

## 2016-02-05 ENCOUNTER — Ambulatory Visit (INDEPENDENT_AMBULATORY_CARE_PROVIDER_SITE_OTHER): Payer: Medicaid Other | Admitting: Obstetrics & Gynecology

## 2016-02-05 VITALS — BP 168/96 | HR 106 | Wt 171.0 lb

## 2016-02-05 DIAGNOSIS — Z331 Pregnant state, incidental: Secondary | ICD-10-CM

## 2016-02-05 DIAGNOSIS — Z1389 Encounter for screening for other disorder: Secondary | ICD-10-CM

## 2016-02-05 DIAGNOSIS — O09893 Supervision of other high risk pregnancies, third trimester: Secondary | ICD-10-CM

## 2016-02-05 DIAGNOSIS — O10913 Unspecified pre-existing hypertension complicating pregnancy, third trimester: Secondary | ICD-10-CM | POA: Diagnosis not present

## 2016-02-05 DIAGNOSIS — O360131 Maternal care for anti-D [Rh] antibodies, third trimester, fetus 1: Secondary | ICD-10-CM

## 2016-02-05 DIAGNOSIS — Z3A35 35 weeks gestation of pregnancy: Secondary | ICD-10-CM

## 2016-02-05 DIAGNOSIS — O10919 Unspecified pre-existing hypertension complicating pregnancy, unspecified trimester: Secondary | ICD-10-CM

## 2016-02-05 LAB — POCT URINALYSIS DIPSTICK
Blood, UA: NEGATIVE
LEUKOCYTES UA: NEGATIVE
Nitrite, UA: NEGATIVE

## 2016-02-05 MED ORDER — LABETALOL HCL 100 MG PO TABS: 100.0000 mg | ORAL_TABLET | Freq: Two times a day (BID) | ORAL | 2 refills | Status: DC

## 2016-02-05 NOTE — Progress Notes (Signed)
Fetal Surveillance Testing today:  Reactive NST   High Risk Pregnancy Diagnosis(es):   CHTN  [id number] 1283w2d Estimated Date of Delivery: 03/16/16  Blood pressure (!) 168/96, pulse (!) 106, weight 171 lb (77.6 kg), last menstrual period 06/10/2015.  Urinalysis: Negative   HPI: The patient is being seen today for ongoing management of chronic hypertension. Today she reports BP at home are creeping upto more of the 140s/upper 90s   BP weight and urine results all reviewed and noted. Patient reports good fetal movement, denies any bleeding and no rupture of membranes symptoms or regular contractions.  Fundal Height:  34 Fetal Heart rate:  150  Edema:  none  Patient is without complaints other than noted in her HPI. All questions were answered.  All lab and sonogram results have been reviewed. Comments: abnormal:    Assessment:  1.  Pregnancy at 3083w2d,  Estimated Date of Delivery: 03/16/16 :                          2.  CHTN                        3.    Medication(s) Plans:  Begin meds today with labetalol 100 BID, baby ASA  Treatment Plan:  Twice weekly surveillance, sonogram alternating with NST, induction at 39 weeks or as clinically indicated   Return in about 4 days (around 02/09/2016). for appointment for high risk OB care  Meds ordered this encounter  Medications  . labetalol (NORMODYNE) 100 MG tablet    Sig: Take 1 tablet (100 mg total) by mouth 2 (two) times daily.    Dispense:  60 tablet    Refill:  2   Orders Placed This Encounter  Procedures  . POCT urinalysis dipstick

## 2016-02-09 ENCOUNTER — Other Ambulatory Visit: Payer: Medicaid Other

## 2016-02-09 ENCOUNTER — Other Ambulatory Visit: Payer: Self-pay | Admitting: Obstetrics & Gynecology

## 2016-02-09 ENCOUNTER — Ambulatory Visit (INDEPENDENT_AMBULATORY_CARE_PROVIDER_SITE_OTHER): Payer: Medicaid Other | Admitting: Obstetrics & Gynecology

## 2016-02-09 ENCOUNTER — Encounter: Payer: Self-pay | Admitting: Obstetrics & Gynecology

## 2016-02-09 ENCOUNTER — Ambulatory Visit (INDEPENDENT_AMBULATORY_CARE_PROVIDER_SITE_OTHER): Payer: Medicaid Other

## 2016-02-09 VITALS — BP 140/90 | HR 84 | Wt 172.0 lb

## 2016-02-09 DIAGNOSIS — Z1389 Encounter for screening for other disorder: Secondary | ICD-10-CM | POA: Diagnosis not present

## 2016-02-09 DIAGNOSIS — O10919 Unspecified pre-existing hypertension complicating pregnancy, unspecified trimester: Secondary | ICD-10-CM

## 2016-02-09 DIAGNOSIS — O10912 Unspecified pre-existing hypertension complicating pregnancy, second trimester: Secondary | ICD-10-CM

## 2016-02-09 DIAGNOSIS — O09893 Supervision of other high risk pregnancies, third trimester: Secondary | ICD-10-CM | POA: Diagnosis not present

## 2016-02-09 DIAGNOSIS — Z3A35 35 weeks gestation of pregnancy: Secondary | ICD-10-CM

## 2016-02-09 DIAGNOSIS — Z331 Pregnant state, incidental: Secondary | ICD-10-CM | POA: Diagnosis not present

## 2016-02-09 LAB — POCT URINALYSIS DIPSTICK
Blood, UA: NEGATIVE
GLUCOSE UA: 4
Leukocytes, UA: NEGATIVE
NITRITE UA: NEGATIVE
Protein, UA: NEGATIVE

## 2016-02-09 NOTE — Progress Notes (Signed)
Fetal Surveillance Testing today:  BPP 8/8    High Risk Pregnancy Diagnosis(es):   Chronic hypertension  G3P2002 6283w6d Estimated Date of Delivery: 03/16/16  Blood pressure 140/90, pulse 84, weight 172 lb (78 kg), last menstrual period 06/10/2015.  Urinalysis: Negative   HPI: The patient is being seen today for ongoing management of as above. Today she reports BP in the 130/80s range now on labetalol   BP weight and urine results all reviewed and noted. Patient reports good fetal movement, denies any bleeding and no rupture of membranes symptoms or regular contractions.  Fundal Height:  35 Fetal Heart rate:  140 Edema:  none  Patient is without complaints other than noted in her HPI. All questions were answered.  All lab and sonogram results have been reviewed. Comments: abnormal:    Assessment:  1.  Pregnancy at 6983w6d,  Estimated Date of Delivery: 03/16/16 :                          2.  Chronic HYpertension                        3.    Medication(s) Plans:  Cont labetalol 100 BID  Treatment Plan:  Twice weekly surveillance, sonogram alternating with NST, induction at 39 weeks or as clinically indicated   Return in about 3 days (around 02/12/2016) for NST, HROB. for appointment for high risk OB care  No orders of the defined types were placed in this encounter.  Orders Placed This Encounter  Procedures  . POCT urinalysis dipstick

## 2016-02-09 NOTE — Progress Notes (Signed)
[place] 34+6 wks,cephalic,fhr 130 bpm,BPP 8/8,RI .66,.63,post pl gr 1,normal ov's bilat,efw 2691 g 57%,afi 10 cm

## 2016-02-12 ENCOUNTER — Ambulatory Visit (INDEPENDENT_AMBULATORY_CARE_PROVIDER_SITE_OTHER): Payer: Medicaid Other | Admitting: Obstetrics & Gynecology

## 2016-02-12 ENCOUNTER — Encounter: Payer: Self-pay | Admitting: Obstetrics & Gynecology

## 2016-02-12 VITALS — BP 100/80 | HR 80 | Wt 173.0 lb

## 2016-02-12 DIAGNOSIS — O10913 Unspecified pre-existing hypertension complicating pregnancy, third trimester: Secondary | ICD-10-CM | POA: Diagnosis not present

## 2016-02-12 DIAGNOSIS — Z331 Pregnant state, incidental: Secondary | ICD-10-CM | POA: Diagnosis not present

## 2016-02-12 DIAGNOSIS — O10919 Unspecified pre-existing hypertension complicating pregnancy, unspecified trimester: Secondary | ICD-10-CM

## 2016-02-12 DIAGNOSIS — Z1389 Encounter for screening for other disorder: Secondary | ICD-10-CM

## 2016-02-12 DIAGNOSIS — Z3A36 36 weeks gestation of pregnancy: Secondary | ICD-10-CM

## 2016-02-12 DIAGNOSIS — O09893 Supervision of other high risk pregnancies, third trimester: Secondary | ICD-10-CM

## 2016-02-12 LAB — POCT URINALYSIS DIPSTICK
Glucose, UA: NEGATIVE
Ketones, UA: NEGATIVE
LEUKOCYTES UA: NEGATIVE
NITRITE UA: NEGATIVE

## 2016-02-12 NOTE — Progress Notes (Signed)
Fetal Surveillance Testing today:  Reactive NST   High Risk Pregnancy Diagnosis(es):   Chronic Hypertension  G3P2002 6378w2d Estimated Date of Delivery: 03/16/16  Blood pressure 100/80, pulse 80, weight 173 lb (78.5 kg), last menstrual period 06/10/2015.  Urinalysis: Negative   HPI: The patient is being seen today for ongoing management of CHTN. Today she reports BP are better on small amount of labetalol   BP weight and urine results all reviewed and noted. Patient reports good fetal movement, denies any bleeding and no rupture of membranes symptoms or regular contractions.  Fundal Height:  35 Fetal Heart rate:  140 Edema:  none  Patient is without complaints other than noted in her HPI. All questions were answered.  All lab and sonogram results have been reviewed. Comments: abnormal: BP   Assessment:  1.  Pregnancy at 7478w2d,  Estimated Date of Delivery: 03/16/16 :                          2.  CHTN                        3.    Medication(s) Plans:  Baby ASA, labetalol 100 BID  Treatment Plan:  Twice weekly surveillance, sonogram alternating with NST, induction at 39 weeks or as clinically indicated   Return in about 5 days (around 02/17/2016) for NST, HROB, with Dr Despina HiddenEure. for appointment for high risk OB care  No orders of the defined types were placed in this encounter.  Orders Placed This Encounter  Procedures  . POCT urinalysis dipstick

## 2016-02-17 ENCOUNTER — Ambulatory Visit (INDEPENDENT_AMBULATORY_CARE_PROVIDER_SITE_OTHER): Payer: Medicaid Other | Admitting: Obstetrics & Gynecology

## 2016-02-17 ENCOUNTER — Encounter: Payer: Self-pay | Admitting: Obstetrics & Gynecology

## 2016-02-17 VITALS — BP 140/90 | HR 96 | Wt 174.0 lb

## 2016-02-17 DIAGNOSIS — Z1389 Encounter for screening for other disorder: Secondary | ICD-10-CM

## 2016-02-17 DIAGNOSIS — O09893 Supervision of other high risk pregnancies, third trimester: Secondary | ICD-10-CM

## 2016-02-17 DIAGNOSIS — Z3A36 36 weeks gestation of pregnancy: Secondary | ICD-10-CM

## 2016-02-17 DIAGNOSIS — O10913 Unspecified pre-existing hypertension complicating pregnancy, third trimester: Secondary | ICD-10-CM | POA: Diagnosis not present

## 2016-02-17 DIAGNOSIS — Z331 Pregnant state, incidental: Secondary | ICD-10-CM | POA: Diagnosis not present

## 2016-02-17 DIAGNOSIS — O10919 Unspecified pre-existing hypertension complicating pregnancy, unspecified trimester: Secondary | ICD-10-CM

## 2016-02-17 LAB — POCT URINALYSIS DIPSTICK
Blood, UA: NEGATIVE
GLUCOSE UA: NEGATIVE
Leukocytes, UA: NEGATIVE
Nitrite, UA: NEGATIVE
Protein, UA: NEGATIVE

## 2016-02-17 NOTE — Progress Notes (Signed)
Fetal Surveillance Testing today:  Reactive NST   High Risk Pregnancy Diagnosis(es):   CHTN  [id number] 1772w0d Estimated Date of Delivery: 03/16/16  Blood pressure 140/90, pulse 96, weight 174 lb (78.9 kg), last menstrual period 06/10/2015.  Urinalysis: Negative   HPI: The patient is being seen today for ongoing management of as above. Today she reports BP at home are good   BP weight and urine results all reviewed and noted. Patient reports good fetal movement, denies any bleeding and no rupture of membranes symptoms or regular contractions.  Fundal Height:  37 Fetal Heart rate:  140 Edema:  none  Patient is without complaints other than noted in her HPI. All questions were answered.  All lab and sonogram results have been reviewed. Comments:    Assessment:  1.  Pregnancy at 7172w0d,  Estimated Date of Delivery: 03/16/16 :                          2.  CHTN                        3.    Medication(s) Plans:  Labetalol 100 BID baby ASA  Treatment Plan:  Twice weekly surveillance, sonogram alternating with NST, induction at 39 weeks or as clinically indicated   Return in about 3 days (around 02/20/2016) for BPP/sono, HROB. for appointment for high risk OB care  No orders of the defined types were placed in this encounter.  Orders Placed This Encounter  Procedures  . US Fetal BPP W/O Non Stress  . US UA Cord Doppler  . POCT urinalysis dipstick

## 2016-02-17 NOTE — Progress Notes (Signed)
   Induction Assessment Scheduling Form: Fax to [facility]:  [phone]  [name]                                                                                   DOB:  01/28/1981                                                            MRN:  [id number]                                                                     Phone #:   [phone]                         Provider:  [facility]  GP:  [id number]                                                            Estimated Date of Delivery: 03/16/16  Dating Criteria: 8 week sonogram    Medical Indications for induction:  Chronic Hypertension Admission Date/Time:  03/09/2016@0730  Gestational age on admission:  562w0d   Filed Weights   02/17/16 1352  Weight: 174 lb (78.9 kg)   HIV:  Non Reactive (06/30 0839) GBS:  pending  Cervical Exam: pending   Method of induction(proposed):  cytotec probably   Scheduling Provider Signature:  Lazaro ArmsEURE,LUTHER H, MD                                            Today's Date:  02/17/2016

## 2016-02-20 ENCOUNTER — Encounter: Payer: Self-pay | Admitting: Obstetrics & Gynecology

## 2016-02-20 ENCOUNTER — Ambulatory Visit (INDEPENDENT_AMBULATORY_CARE_PROVIDER_SITE_OTHER): Payer: Medicaid Other | Admitting: Obstetrics & Gynecology

## 2016-02-20 ENCOUNTER — Ambulatory Visit (INDEPENDENT_AMBULATORY_CARE_PROVIDER_SITE_OTHER): Payer: Medicaid Other

## 2016-02-20 VITALS — BP 120/70 | HR 74 | Wt 174.0 lb

## 2016-02-20 DIAGNOSIS — O10919 Unspecified pre-existing hypertension complicating pregnancy, unspecified trimester: Secondary | ICD-10-CM

## 2016-02-20 DIAGNOSIS — O10912 Unspecified pre-existing hypertension complicating pregnancy, second trimester: Secondary | ICD-10-CM

## 2016-02-20 DIAGNOSIS — O09893 Supervision of other high risk pregnancies, third trimester: Secondary | ICD-10-CM

## 2016-02-20 DIAGNOSIS — O10913 Unspecified pre-existing hypertension complicating pregnancy, third trimester: Secondary | ICD-10-CM

## 2016-02-20 DIAGNOSIS — Z3A37 37 weeks gestation of pregnancy: Secondary | ICD-10-CM

## 2016-02-20 DIAGNOSIS — Z1389 Encounter for screening for other disorder: Secondary | ICD-10-CM

## 2016-02-20 DIAGNOSIS — Z331 Pregnant state, incidental: Secondary | ICD-10-CM | POA: Diagnosis not present

## 2016-02-20 NOTE — Progress Notes (Signed)
Fetal Surveillance Testing today:  BPP 8/8   High Risk Pregnancy Diagnosis(es):   CHTN  [id number] 88105w3d Estimated Date of Delivery: 03/16/16  Blood pressure 120/70, pulse 74, weight 174 lb (78.9 kg), last menstrual period 06/10/2015.  Urinalysis: Negative   HPI: The patient is being seen today for ongoing management of CHTN. Today she reports BP at home are 130-140s/80-90s   BP weight and urine results all reviewed and noted. Patient reports good fetal movement, denies any bleeding and no rupture of membranes symptoms or regular contractions.  Fundal Height:  36 Fetal Heart rate:  137 Edema:  none  Patient is without complaints other than noted in her HPI. All questions were answered.  All lab and sonogram results have been reviewed. Comments:    Assessment:  1.  Pregnancy at 33105w3d,  Estimated Date of Delivery: 03/16/16 :                          2.  CHTN                        3.    Medication(s) Plans:  Labetalol 100 BID, baby ASA  Treatment Plan:  Twice weekly surveillance, sonogram alternating with NST, induction at 39 weeks or as clinically indicated   Return in about 3 days (around 02/23/2016) for NST, HROB. for appointment for high risk OB care  No orders of the defined types were placed in this encounter.  Orders Placed This Encounter  Procedures  . POCT urinalysis dipstick

## 2016-02-20 NOTE — Progress Notes (Addendum)
US 36+3 wks,cephalic,BPP 8/8,fhr 137 bpm,normal ov's bilat,RI .60,.61,AFI 11 cm,post pl gr 2

## 2016-02-23 ENCOUNTER — Ambulatory Visit (INDEPENDENT_AMBULATORY_CARE_PROVIDER_SITE_OTHER): Payer: Medicaid Other | Admitting: Obstetrics & Gynecology

## 2016-02-23 ENCOUNTER — Encounter: Payer: Self-pay | Admitting: Obstetrics & Gynecology

## 2016-02-23 VITALS — BP 130/90 | HR 80 | Wt 174.0 lb

## 2016-02-23 DIAGNOSIS — Z3A37 37 weeks gestation of pregnancy: Secondary | ICD-10-CM

## 2016-02-23 DIAGNOSIS — Z331 Pregnant state, incidental: Secondary | ICD-10-CM

## 2016-02-23 DIAGNOSIS — O09893 Supervision of other high risk pregnancies, third trimester: Secondary | ICD-10-CM

## 2016-02-23 DIAGNOSIS — Z1389 Encounter for screening for other disorder: Secondary | ICD-10-CM | POA: Diagnosis not present

## 2016-02-23 DIAGNOSIS — Z1159 Encounter for screening for other viral diseases: Secondary | ICD-10-CM

## 2016-02-23 DIAGNOSIS — O10919 Unspecified pre-existing hypertension complicating pregnancy, unspecified trimester: Secondary | ICD-10-CM

## 2016-02-23 DIAGNOSIS — Z118 Encounter for screening for other infectious and parasitic diseases: Secondary | ICD-10-CM

## 2016-02-23 DIAGNOSIS — O10913 Unspecified pre-existing hypertension complicating pregnancy, third trimester: Secondary | ICD-10-CM

## 2016-02-23 DIAGNOSIS — Z3685 Encounter for antenatal screening for Streptococcus B: Secondary | ICD-10-CM

## 2016-02-23 LAB — POCT URINALYSIS DIPSTICK
Blood, UA: NEGATIVE
Glucose, UA: NEGATIVE
KETONES UA: NEGATIVE
LEUKOCYTES UA: NEGATIVE
NITRITE UA: NEGATIVE
PROTEIN UA: NEGATIVE

## 2016-02-23 LAB — OB RESULTS CONSOLE GBS: STREP GROUP B AG: POSITIVE

## 2016-02-23 NOTE — Progress Notes (Signed)
Fetal Surveillance Testing today:  Reactive NST   High Risk Pregnancy Diagnosis(es):   CHTN  [id number] 33108w6d Estimated Date of Delivery: 03/16/16  Blood pressure 130/90, pulse 80, weight 174 lb (78.9 kg), last menstrual period 06/10/2015.  Urinalysis: Negative   HPI: The patient is being seen today for ongoing management of CHTN. Today she reports BP at home are staying in the sam range, 140s/90s   BP weight and urine results all reviewed and noted. Patient reports good fetal movement, denies any bleeding and no rupture of membranes symptoms or regular contractions.  Fundal Height:  36  Fetal Heart rate:  130 Edema:  none  Patient is without complaints other than noted in her HPI. All questions were answered.  All lab and sonogram results have been reviewed. Comments: abnormal: CHTN   Assessment:  1.  Pregnancy at 59108w6d,  Estimated Date of Delivery: 03/16/16 :                          2.  CHTN                        3.    Medication(s) Plans:  Labetalol 100 BID  Treatment Plan:  Twice weekly surveillance, sonogram alternating with NST, induction at 39 weeks or as clinically indicated   Return in about 3 days (around 02/26/2016) for BPP/sono, HROB. for appointment for high risk OB care  No orders of the defined types were placed in this encounter.  Orders Placed This Encounter  Procedures  . GC/Chlamydia Probe Amp  . Strep Gp B NAA  . POCT urinalysis dipstick

## 2016-02-24 LAB — GC/CHLAMYDIA PROBE AMP
Chlamydia trachomatis, NAA: NEGATIVE
Neisseria gonorrhoeae by PCR: NEGATIVE

## 2016-02-25 ENCOUNTER — Other Ambulatory Visit: Payer: Self-pay | Admitting: Obstetrics & Gynecology

## 2016-02-25 DIAGNOSIS — I10 Essential (primary) hypertension: Secondary | ICD-10-CM

## 2016-02-25 LAB — STREP GP B NAA: STREP GROUP B AG: POSITIVE — AB

## 2016-02-26 ENCOUNTER — Ambulatory Visit (INDEPENDENT_AMBULATORY_CARE_PROVIDER_SITE_OTHER): Payer: Medicaid Other

## 2016-02-26 ENCOUNTER — Ambulatory Visit (INDEPENDENT_AMBULATORY_CARE_PROVIDER_SITE_OTHER): Payer: Medicaid Other | Admitting: Obstetrics & Gynecology

## 2016-02-26 VITALS — BP 155/105 | HR 90 | Wt 176.0 lb

## 2016-02-26 DIAGNOSIS — O09893 Supervision of other high risk pregnancies, third trimester: Secondary | ICD-10-CM | POA: Diagnosis not present

## 2016-02-26 DIAGNOSIS — O10913 Unspecified pre-existing hypertension complicating pregnancy, third trimester: Secondary | ICD-10-CM | POA: Diagnosis not present

## 2016-02-26 DIAGNOSIS — I10 Essential (primary) hypertension: Secondary | ICD-10-CM

## 2016-02-26 DIAGNOSIS — Z3A38 38 weeks gestation of pregnancy: Secondary | ICD-10-CM

## 2016-02-26 DIAGNOSIS — O10919 Unspecified pre-existing hypertension complicating pregnancy, unspecified trimester: Secondary | ICD-10-CM

## 2016-02-26 DIAGNOSIS — Z1389 Encounter for screening for other disorder: Secondary | ICD-10-CM | POA: Diagnosis not present

## 2016-02-26 DIAGNOSIS — Z331 Pregnant state, incidental: Secondary | ICD-10-CM

## 2016-02-26 LAB — POCT URINALYSIS DIPSTICK
Glucose, UA: NEGATIVE
Glucose, UA: NEGATIVE
KETONES UA: NEGATIVE
Leukocytes, UA: NEGATIVE
Nitrite, UA: NEGATIVE
PROTEIN UA: NEGATIVE

## 2016-02-26 MED ORDER — LABETALOL HCL 200 MG PO TABS: 200.0000 mg | ORAL_TABLET | Freq: Three times a day (TID) | ORAL | 2 refills | Status: DC

## 2016-02-26 NOTE — Progress Notes (Signed)
US 37+2 wks,cephalic,post pl gr 2,normal ov's bilat,BPP 8/8,FHR 160 bpm, afi 13 cm,EFW 3189 g 57%,RI .56,.63

## 2016-02-26 NOTE — Progress Notes (Signed)
Fetal Surveillance Testing today:  BPP 8/8 with excellent Doppler flow   High Risk Pregnancy Diagnosis(es):   Chronic hypertension  G3P2002 91104w2d Estimated Date of Delivery: 03/16/16  Blood pressure (!) 160/110, pulse 90, weight 176 lb (79.8 kg), last menstrual period 06/10/2015.  Urinalysis: Positive for trace protein   HPI: The patient is being seen today for ongoing management of chronic hypertension. Today she reports BP at home have been going up a bit range mostly 140s/90s, 150/100   BP weight and urine results all reviewed and noted. Patient reports good fetal movement, denies any bleeding and no rupture of membranes symptoms or regular contractions.  Fundal Height:  36 Fetal Heart rate:  145 Edema:  trace  Patient is without complaints other than noted in her HPI. All questions were answered.  All lab and sonogram results have been reviewed. Comments: BP rising a bit   Assessment:  1.  Pregnancy at 48104w2d,  Estimated Date of Delivery: 03/16/16 :                          2.  Chronic hypertension                        3.    Medication(s) Plans:  Increase labetalol to 200 TID  Treatment Plan:  Induction scheduled 9/26, Twice weekly surveillance, sonogram alternating with NST, induction at 39 weeks or as clinically indicated   Return in about 4 days (around 03/01/2016) for NST, HROB, with Dr Despina HiddenEure. for appointment for high risk OB care  Meds ordered this encounter  Medications  . labetalol (NORMODYNE) 200 MG tablet    Sig: Take 1 tablet (200 mg total) by mouth 3 (three) times daily.    Dispense:  90 tablet    Refill:  2   Orders Placed This Encounter  Procedures  . POCT urinalysis dipstick

## 2016-02-27 ENCOUNTER — Encounter: Payer: Self-pay | Admitting: Obstetrics & Gynecology

## 2016-02-27 LAB — PROTEIN / CREATININE RATIO, URINE
CREATININE, UR: 105.7 mg/dL
Protein, Ur: 19.1 mg/dL
Protein/Creat Ratio: 181 mg/g creat (ref 0–200)

## 2016-03-01 ENCOUNTER — Ambulatory Visit (INDEPENDENT_AMBULATORY_CARE_PROVIDER_SITE_OTHER): Payer: Medicaid Other | Admitting: Obstetrics & Gynecology

## 2016-03-01 ENCOUNTER — Encounter (HOSPITAL_COMMUNITY): Payer: Self-pay

## 2016-03-01 ENCOUNTER — Inpatient Hospital Stay (HOSPITAL_COMMUNITY)
Admission: AD | Admit: 2016-03-01 | Discharge: 2016-03-01 | Disposition: A | Discharge status: Home / Self Care | Payer: Medicaid Other | Source: Ambulatory Visit | Attending: Obstetrics & Gynecology | Admitting: Obstetrics & Gynecology

## 2016-03-01 VITALS — BP 140/80 | HR 74 | Wt 176.0 lb

## 2016-03-01 DIAGNOSIS — O09893 Supervision of other high risk pregnancies, third trimester: Secondary | ICD-10-CM | POA: Diagnosis not present

## 2016-03-01 DIAGNOSIS — Z3A37 37 weeks gestation of pregnancy: Secondary | ICD-10-CM | POA: Insufficient documentation

## 2016-03-01 DIAGNOSIS — Z7982 Long term (current) use of aspirin: Secondary | ICD-10-CM | POA: Insufficient documentation

## 2016-03-01 DIAGNOSIS — Z3A38 38 weeks gestation of pregnancy: Secondary | ICD-10-CM

## 2016-03-01 DIAGNOSIS — R030 Elevated blood-pressure reading, without diagnosis of hypertension: Secondary | ICD-10-CM | POA: Diagnosis present

## 2016-03-01 DIAGNOSIS — O10913 Unspecified pre-existing hypertension complicating pregnancy, third trimester: Secondary | ICD-10-CM

## 2016-03-01 DIAGNOSIS — Z87891 Personal history of nicotine dependence: Secondary | ICD-10-CM | POA: Diagnosis not present

## 2016-03-01 DIAGNOSIS — O10919 Unspecified pre-existing hypertension complicating pregnancy, unspecified trimester: Secondary | ICD-10-CM

## 2016-03-01 DIAGNOSIS — Z331 Pregnant state, incidental: Secondary | ICD-10-CM

## 2016-03-01 DIAGNOSIS — O471 False labor at or after 37 completed weeks of gestation: Secondary | ICD-10-CM | POA: Diagnosis not present

## 2016-03-01 DIAGNOSIS — O10013 Pre-existing essential hypertension complicating pregnancy, third trimester: Secondary | ICD-10-CM | POA: Diagnosis not present

## 2016-03-01 DIAGNOSIS — Z1389 Encounter for screening for other disorder: Secondary | ICD-10-CM | POA: Diagnosis not present

## 2016-03-01 LAB — COMPREHENSIVE METABOLIC PANEL
ALBUMIN: 3.3 g/dL — AB (ref 3.5–5.0)
ALK PHOS: 110 U/L (ref 38–126)
ALT: 16 U/L (ref 14–54)
AST: 17 U/L (ref 15–41)
Anion gap: 7 (ref 5–15)
BILIRUBIN TOTAL: 0.6 mg/dL (ref 0.3–1.2)
BUN: <5 mg/dL — ABNORMAL LOW (ref 6–20)
CALCIUM: 8.5 mg/dL — AB (ref 8.9–10.3)
CO2: 23 mmol/L (ref 22–32)
CREATININE: 0.53 mg/dL (ref 0.44–1.00)
Chloride: 106 mmol/L (ref 101–111)
GFR calc Af Amer: >60 mL/min (ref >60)
GFR calc non Af Amer: >60 mL/min (ref >60)
GLUCOSE: 72 mg/dL (ref 65–99)
Potassium: 3.5 mmol/L (ref 3.5–5.1)
SODIUM: 136 mmol/L (ref 135–145)
Total Protein: 6.5 g/dL (ref 6.5–8.1)

## 2016-03-01 LAB — CBC
HEMATOCRIT: 34.3 % — AB (ref 36.0–46.0)
HEMOGLOBIN: 11.2 g/dL — AB (ref 12.0–15.0)
MCH: 29 pg (ref 26.0–34.0)
MCHC: 32.7 g/dL (ref 30.0–36.0)
MCV: 88.9 fL (ref 78.0–100.0)
Platelets: 260 10*3/uL (ref 150–400)
RBC: 3.86 MIL/uL — ABNORMAL LOW (ref 3.87–5.11)
RDW: 15.1 % (ref 11.5–15.5)
WBC: 9.3 10*3/uL (ref 4.0–10.5)

## 2016-03-01 LAB — URINE MICROSCOPIC-ADD ON

## 2016-03-01 LAB — URINALYSIS, ROUTINE W REFLEX MICROSCOPIC
BILIRUBIN URINE: NEGATIVE
Glucose, UA: NEGATIVE mg/dL
Ketones, ur: NEGATIVE mg/dL
Nitrite: NEGATIVE
PROTEIN: NEGATIVE mg/dL
Specific Gravity, Urine: <1.005 — ABNORMAL LOW (ref 1.005–1.030)
pH: 6 (ref 5.0–8.0)

## 2016-03-01 LAB — POCT URINALYSIS DIPSTICK
Blood, UA: NEGATIVE
Glucose, UA: NEGATIVE
Ketones, UA: NEGATIVE
Leukocytes, UA: NEGATIVE
Nitrite, UA: NEGATIVE
PROTEIN UA: NEGATIVE

## 2016-03-01 LAB — PROTEIN / CREATININE RATIO, URINE
Creatinine, Urine: 47 mg/dL
PROTEIN CREATININE RATIO: 0.17 mg/mg{creat} — AB (ref 0.00–0.15)
TOTAL PROTEIN, URINE: 8 mg/dL

## 2016-03-01 MED ORDER — ACETAMINOPHEN 500 MG PO TABS
1000.0000 mg | ORAL_TABLET | Freq: Once | ORAL | Status: AC
  Administered 2016-03-01: 1000 mg via ORAL
  Filled 2016-03-01: qty 2

## 2016-03-01 MED ORDER — CYCLOBENZAPRINE HCL 10 MG PO TABS: 10.0000 mg | ORAL_TABLET | Freq: Three times a day (TID) | ORAL | 2 refills | Status: DC | PRN

## 2016-03-01 MED ORDER — ACETAMINOPHEN 500 MG PO TABS: 1000.0000 mg | ORAL_TABLET | Freq: Four times a day (QID) | ORAL | 0 refills | Status: DC | PRN

## 2016-03-01 NOTE — MAU Note (Signed)
Ctx that started around 3pm and have been about 5-7 minutes apart. Also had high BP at home.  147/104

## 2016-03-01 NOTE — Progress Notes (Signed)
Fetal Surveillance Testing today:  Reactive NST   High Risk Pregnancy Diagnosis(es):   Chronic hypertension  G3P2002 4778w6d Estimated Date of Delivery: 03/16/16  Blood pressure 140/80, pulse 74, weight 176 lb (79.8 kg), last menstrual period 06/10/2015.  Urinalysis: Negative   HPI: The patient is being seen today for ongoing management of chronic hypertension. Today she reports BP at home 140-150/90-100   BP weight and urine results all reviewed and noted. Patient reports good fetal movement, denies any bleeding and no rupture of membranes symptoms or regular contractions.  Fundal Height:  37 Fetal Heart rate:  145 Edema:  none  Patient is without complaints other than noted in her HPI. All questions were answered.  All lab and sonogram results have been reviewed. Comments:    Assessment:  1.  Pregnancy at 5878w6d,  Estimated Date of Delivery: 03/16/16 :                          2.  Chronic Hypertension                        3.    Medication(s) Plans:  Continue labetalol 200 TID  Treatment Plan:  Twice weekly surveillance, sonogram alternating with NST, induction at 39 weeks or as clinically indicated   Return in about 4 days (around 03/05/2016) for BPP/sono, HROB, with Dr Despina HiddenEure. for appointment for high risk OB care  No orders of the defined types were placed in this encounter.  Orders Placed This Encounter  Procedures  . US Fetal BPP W/O Non Stress  . US UA Cord Doppler  . POCT urinalysis dipstick

## 2016-03-01 NOTE — MAU Provider Note (Signed)
[facility] Attending Note  History     CSN: [id number]  Arrival date & time 03/01/16  4098  First Provider Initiated Contact with Patient 03/01/16 1916      Chief Complaint  Patient presents with  . Contractions  . Hypertension    35 y.o. [id number] at [redacted]w[redacted]d presents to MAU today for evaluation of frequent contractions and elevated BP. Contractions were coming every 4-5 minutes for over an hour; no LOF, no VB. Good FM. Also reports elevated BP,  Has CHTN for which she is on Labetalol 200 mg po tid. Has mild headache (did not take anything at home), no visual changes, no RUQ/epigastric pain.  Denies any abnormal vaginal discharge, fevers, chills, sweats, dysuria, nausea, vomiting, other GI or GU symptoms or other general symptoms. Followed at [facility].   Obstetric History   G3   P2   T2   P0   A0   L2    SAB0   TAB0   Ectopic0   Multiple0   Live Births2     # Outcome Date GA Lbr Len/2nd Weight Sex Delivery Anes PTL Lv  3 Current           2 Term 12/21/09 [redacted]w[redacted]d  6 lb 11 oz (3.033 kg) F Vag-Spont Pud N LIV  1 Term 08/04/07 [redacted]w[redacted]d  5 lb 5 oz (2.41 kg) F Vag-Spont EPI N LIV      Past Medical History:  Diagnosis Date  . Anxiety   . Depression   . Essential hypertension    Medication since 04/2015  . History of headache   . Hypertension    chronic, was on Lisinopril prior to preg.  . Pregnant 07/17/2015  . Vaginal itching 08/19/2015  . Yeast infection 08/19/2015    Past Surgical History:  Procedure Laterality Date  . CHOLECYSTECTOMY      Family History  Problem Relation Age of Onset  . Diabetes Father   . Heart attack Father     Age 70  . Hypertension Father   . Hypertension Mother   . Hyperlipidemia Mother   . Heart attack Maternal Grandfather   . Heart attack Paternal Grandmother   . Stroke Paternal Grandmother   . Heart attack Paternal Grandfather     Social History  Substance Use Topics  . Smoking status: Former Smoker    Packs/day: 0.00   Years: 17.00    Types: Cigarettes    Start date: 07/14/1991    Quit date: 07/13/2015  . Smokeless tobacco: Never Used  . Alcohol use No     Comment: Occasional; not now   Allergies  Allergen Reactions  . Zolpidem Tartrate Other (See Comments)    Reaction:  Hallucinations   . Latex Itching and Rash     Current Outpatient Prescriptions on File Prior to Encounter  Medication Sig Dispense Refill  . aspirin EC 81 MG tablet Take 81 mg by mouth at bedtime.     Marland Kitchen buPROPion (WELLBUTRIN XL) 300 MG 24 hr tablet Take 300 mg by mouth daily.    . ferrous sulfate 325 (65 FE) MG tablet Take 325 mg by mouth daily with breakfast.    . labetalol (NORMODYNE) 200 MG tablet Take 1 tablet (200 mg total) by mouth 3 (three) times daily. 90 tablet 2  . NIFEdipine (PROCARDIA-XL/ADALAT-CC/NIFEDICAL-XL) 30 MG 24 hr tablet Take 1 tablet (30 mg total) by mouth daily. For 2 days then 1 tablet PO qday prn for mild contractions. 7 tablet 0  .  Prenat-FeFmCb-DSS-FA-DHA w/o A (CITRANATAL HARMONY) 27-1-260 MG CAPS Take 1 capsule by mouth 2 (two) times daily.      Physical Exam  BP 148/82   Pulse 83   Temp 98.3 F (36.8 C)   Resp 16   Ht 5' (1.524 m)   Wt 176 lb (79.8 kg)   LMP 06/10/2015 (Exact Date)   BMI 34.37 kg/m   Category I FHR tracing Contractions q4-6 minutes on tocometer Dilation: 3 Effacement (%): 60 Station: -3 Presentation: Vertex Exam by:: Dr. Macon Large  GENERAL: Well-developed, well-nourished female in no acute distress.  PSYCH: Normal mood and affect SKIN: Warm, dry and without erythema HEENT: Normocephalic, atraumatic.   LUNGS: Normal respiratory effort, normal breath sounds HEART: Regular rate noted ABDOMEN: Soft, gravid, nondistended, nontender EXTREMITIES: Trace edema bilaterally, 2+ DTRs  MAU Course/MDM  Labs ordered Tylenol 1000 mg po given for headache  Labs and Imaging   Results for orders placed or performed during the hospital encounter of 03/01/16 (from the past 24  hour(s))  Urinalysis, Routine w reflex microscopic (not at [facility])     Status: Abnormal   Collection Time: 03/01/16  7:00 PM  Result Value Ref Range   Color, Urine YELLOW YELLOW   APPearance CLEAR CLEAR   Specific Gravity, Urine <1.005 (L) 1.005 - 1.030   pH 6.0 5.0 - 8.0   Glucose, UA NEGATIVE NEGATIVE mg/dL   Hgb urine dipstick SMALL (A) NEGATIVE   Bilirubin Urine NEGATIVE NEGATIVE   Ketones, ur NEGATIVE NEGATIVE mg/dL   Protein, ur NEGATIVE NEGATIVE mg/dL   Nitrite NEGATIVE NEGATIVE   Leukocytes, UA SMALL (A) NEGATIVE  Protein / creatinine ratio, urine     Status: Abnormal   Collection Time: 03/01/16  7:00 PM  Result Value Ref Range   Creatinine, Urine 47.00 mg/dL   Total Protein, Urine 8 mg/dL   Protein Creatinine Ratio 0.17 (H) 0.00 - 0.15 mg/mg[Cre]  Urine microscopic-add on     Status: Abnormal   Collection Time: 03/01/16  7:00 PM  Result Value Ref Range   Squamous Epithelial / LPF 0-5 (A) NONE SEEN   WBC, UA 0-5 0 - 5 WBC/hpf   RBC / HPF 0-5 0 - 5 RBC/hpf   Bacteria, UA FEW (A) NONE SEEN  CBC     Status: Abnormal   Collection Time: 03/01/16  7:33 PM  Result Value Ref Range   WBC 9.3 4.0 - 10.5 K/uL   RBC 3.86 (L) 3.87 - 5.11 MIL/uL   Hemoglobin 11.2 (L) 12.0 - 15.0 g/dL   HCT 96.0 (L) 45.4 - 09.8 %   MCV 88.9 78.0 - 100.0 fL   MCH 29.0 26.0 - 34.0 pg   MCHC 32.7 30.0 - 36.0 g/dL   RDW 11.9 14.7 - 82.9 %   Platelets 260 150 - 400 K/uL  Comprehensive metabolic panel     Status: Abnormal   Collection Time: 03/01/16  7:33 PM  Result Value Ref Range   Sodium 136 135 - 145 mmol/L   Potassium 3.5 3.5 - 5.1 mmol/L   Chloride 106 101 - 111 mmol/L   CO2 23 22 - 32 mmol/L   Glucose, Bld 72 65 - 99 mg/dL   BUN <5 (L) 6 - 20 mg/dL   Creatinine, Ser 5.62 0.44 - 1.00 mg/dL   Calcium 8.5 (L) 8.9 - 10.3 mg/dL   Total Protein 6.5 6.5 - 8.1 g/dL   Albumin 3.3 (L) 3.5 - 5.0 g/dL   AST 17 15 - 41 U/L  ALT 16 14 - 54 U/L   Alkaline Phosphatase 110 38 - 126 U/L   Total  Bilirubin 0.6 0.3 - 1.2 mg/dL   GFR calc non Af Amer >60 >60 mL/min   GFR calc Af Amer >60 >60 mL/min   Anion gap 7 5 - 15    Assessment and Plan   1. False labor after 37 weeks of gestation without delivery   2. Supervision of other high-risk pregnancy, third trimester   3. Chronic hypertension during pregnancy, antepartum   IUP at 5736w6d Category I FHR tracing No signs/symptoms of superimposed preeclampsia; preeclampsia precautions reviewed with patient. Already scheduled for IOL on 03/09/16 at [redacted] weeks GA. No signs of progressing term labor; labor and fetal movement precautions advised.  Was told to return to MAU for any pain, bleeding or other concerns, or if her condition were to change or worsen. Discharged to home in stable condition   Jaynie CollinsUGONNA  Izrael Peak, MD, FACOG Attending Obstetrician & Gynecologist, [facility], [facility]

## 2016-03-01 NOTE — Discharge Instructions (Signed)
Braxton Hicks Contractions °Contractions of the uterus can occur throughout pregnancy. Contractions are not always a sign that you are in labor.  °WHAT ARE BRAXTON HICKS CONTRACTIONS?  °Contractions that occur before labor are called Braxton Hicks contractions, or false labor. Toward the end of pregnancy (32-34 weeks), these contractions can develop more often and may become more forceful. This is not true labor because these contractions do not result in opening (dilatation) and thinning of the cervix. They are sometimes difficult to tell apart from true labor because these contractions can be forceful and people have different pain tolerances. You should not feel embarrassed if you go to the hospital with false labor. Sometimes, the only way to tell if you are in true labor is for your health care provider to look for changes in the cervix. °If there are no prenatal problems or other health problems associated with the pregnancy, it is completely safe to be sent home with false labor and await the onset of true labor. °HOW CAN YOU TELL THE DIFFERENCE BETWEEN TRUE AND FALSE LABOR? °False Labor °· The contractions of false labor are usually shorter and not as hard as those of true labor.   °· The contractions are usually irregular.   °· The contractions are often felt in the front of the lower abdomen and in the groin.   °· The contractions may go away when you walk around or change positions while lying down.   °· The contractions get weaker and are shorter lasting as time goes on.   °· The contractions do not usually become progressively stronger, regular, and closer together as with true labor.   °True Labor °· Contractions in true labor last 30-70 seconds, become very regular, usually become more intense, and increase in frequency.   °· The contractions do not go away with walking.   °· The discomfort is usually felt in the top of the uterus and spreads to the lower abdomen and low back.   °· True labor can be  determined by your health care provider with an exam. This will show that the cervix is dilating and getting thinner.   °WHAT TO REMEMBER °· Keep up with your usual exercises and follow other instructions given by your health care provider.   °· Take medicines as directed by your health care provider.   °· Keep your regular prenatal appointments.   °· Eat and drink lightly if you think you are going into labor.   °· If Braxton Hicks contractions are making you uncomfortable:   °¨ Change your position from lying down or resting to walking, or from walking to resting.   °¨ Sit and rest in a tub of warm water.   °¨ Drink 2-3 glasses of water. Dehydration may cause these contractions.   °¨ Do slow and deep breathing several times an hour.   °WHEN SHOULD I SEEK IMMEDIATE MEDICAL CARE? °Seek immediate medical care if: °· Your contractions become stronger, more regular, and closer together.   °· You have fluid leaking or gushing from your vagina.   °· You have a fever.   °· You pass blood-tinged mucus.   °· You have vaginal bleeding.   °· You have continuous abdominal pain.   °· You have low back pain that you never had before.   °· You feel your baby's head pushing down and causing pelvic pressure.   °· Your baby is not moving as much as it used to.   °  °This information is not intended to replace advice given to you by your health care provider. Make sure you discuss any questions you have with your health care   provider. °  °Document Released: 05/31/2005 Document Revised: 06/05/2013 Document Reviewed: 03/12/2013 °Elsevier Interactive Patient Education ©2016 Elsevier Inc[employer] °Hypertension During Pregnancy °Hypertension, or high blood pressure, is when there is extra pressure inside your blood vessels that carry blood from the heart to the rest of your body (arteries). It can happen at any time in life, including pregnancy. Hypertension during pregnancy can cause problems for you and your baby. Your baby might not weigh as  much as he or she should at birth or might be born early (premature). Very bad cases of hypertension during pregnancy can be life-threatening.  °Different types of hypertension can occur during pregnancy. These include: °· Chronic hypertension. This happens when a woman has hypertension before pregnancy and it continues during pregnancy. °· Gestational hypertension. This is when hypertension develops during pregnancy. °· Preeclampsia or toxemia of pregnancy. This is a very serious type of hypertension that develops only during pregnancy. It affects the whole body and can be very dangerous for both mother and baby.   °Gestational hypertension and preeclampsia usually go away after your baby is born. Your blood pressure will likely stabilize within 6 weeks. Women who have hypertension during pregnancy have a greater chance of developing hypertension later in life or with future pregnancies. °RISK FACTORS °There are certain factors that make it more likely for you to develop hypertension during pregnancy. These include: °· Having hypertension before pregnancy. °· Having hypertension during a previous pregnancy. °· Being overweight. °· Being older than 40 years. °· Being pregnant with more than one baby. °· Having diabetes or kidney problems. °SIGNS AND SYMPTOMS °Chronic and gestational hypertension rarely cause symptoms. Preeclampsia has symptoms, which may include: °· Increased protein in your urine. Your health care provider will check for this at every prenatal visit. °· Swelling of your hands and face. °· Rapid weight gain. °· Headaches. °· Visual changes. °· Being bothered by light. °· Abdominal pain, especially in the upper right area. °· Chest pain. °· Shortness of breath. °· Increased reflexes. °· Seizures. These occur with a more severe form of preeclampsia, called eclampsia. °DIAGNOSIS  °You may be diagnosed with hypertension during a regular prenatal exam. At each prenatal visit, you may have: °· Your blood  pressure checked. °· A urine test to check for protein in your urine. °The type of hypertension you are diagnosed with depends on when you developed it. It also depends on your specific blood pressure reading. °· Developing hypertension before 20 weeks of pregnancy is consistent with chronic hypertension. °· Developing hypertension after 20 weeks of pregnancy is consistent with gestational hypertension. °· Hypertension with increased urinary protein is diagnosed as preeclampsia. °· Blood pressure measurements that stay above 160 systolic or 110 diastolic are a sign of severe preeclampsia. °TREATMENT °Treatment for hypertension during pregnancy varies. Treatment depends on the type of hypertension and how serious it is. °· If you take medicine for chronic hypertension, you may need to switch medicines. °¨ Medicines called ACE inhibitors should not be taken during pregnancy. °¨ Low-dose aspirin may be suggested for women who have risk factors for preeclampsia. °· If you have gestational hypertension, you may need to take a blood pressure medicine that is safe during pregnancy. Your health care provider will recommend the correct medicine. °· If you have severe preeclampsia, you may need to be in the hospital. Health care providers will watch you and your baby very closely. You also may need to take medicine called magnesium sulfate to prevent seizures and lower blood   pressure. °· Sometimes, an early delivery is needed. This may be the case if the condition worsens. It would be done to protect you and your baby. The only cure for preeclampsia is delivery. °· Your health care provider may recommend that you take one low-dose aspirin (81 mg) each day to help prevent high blood pressure during your pregnancy if you are at risk for preeclampsia. You may be at risk for preeclampsia if: °¨ You had preeclampsia or eclampsia during a previous pregnancy. °¨ Your baby did not grow as expected during a previous pregnancy. °¨ You  experienced preterm birth with a previous pregnancy. °¨ You experienced a separation of the placenta from the uterus (placental abruption) during a previous pregnancy. °¨ You experienced the loss of your baby during a previous pregnancy. °¨ You are pregnant with more than one baby. °¨ You have other medical conditions, such as diabetes or an autoimmune disease. °HOME CARE INSTRUCTIONS °· Schedule and keep all of your regular prenatal care appointments. This is important. °· Take medicines only as directed by your health care provider. Tell your health care provider about all medicines you take. °· Eat as little salt as possible. °· Get regular exercise. °· Do not drink alcohol. °· Do not use tobacco products. °· Do not drink products with caffeine. °· Lie on your left side when resting. °SEEK IMMEDIATE MEDICAL CARE IF: °· You have severe abdominal pain. °· You have sudden swelling in your hands, ankles, or face. °· You gain 4 pounds (1.8 kg) or more in 1 week. °· You vomit repeatedly. °· You have vaginal bleeding. °· You do not feel your baby moving as much. °· You have a headache. °· You have blurred or double vision. °· You have muscle twitching or spasms. °· You have shortness of breath. °· You have blue fingernails or lips. °· You have blood in your urine. °MAKE SURE YOU: °· Understand these instructions. °· Will watch your condition. °· Will get help right away if you are not doing well or get worse. °  °This information is not intended to replace advice given to you by your health care provider. Make sure you discuss any questions you have with your health care provider. °  °Document Released: 02/16/2011 Document Revised: 06/21/2014 Document Reviewed: 12/28/2012 °Elsevier Interactive Patient Education ©2016 Elsevier Inc. ° °

## 2016-03-02 ENCOUNTER — Ambulatory Visit (INDEPENDENT_AMBULATORY_CARE_PROVIDER_SITE_OTHER): Payer: Medicaid Other | Admitting: Obstetrics & Gynecology

## 2016-03-02 ENCOUNTER — Telehealth: Payer: Self-pay | Admitting: *Deleted

## 2016-03-02 VITALS — BP 144/92 | HR 90 | Wt 175.0 lb

## 2016-03-02 DIAGNOSIS — O09893 Supervision of other high risk pregnancies, third trimester: Secondary | ICD-10-CM

## 2016-03-02 DIAGNOSIS — Z1389 Encounter for screening for other disorder: Secondary | ICD-10-CM | POA: Diagnosis not present

## 2016-03-02 DIAGNOSIS — Z3A38 38 weeks gestation of pregnancy: Secondary | ICD-10-CM

## 2016-03-02 DIAGNOSIS — O10913 Unspecified pre-existing hypertension complicating pregnancy, third trimester: Secondary | ICD-10-CM

## 2016-03-02 DIAGNOSIS — Z331 Pregnant state, incidental: Secondary | ICD-10-CM

## 2016-03-02 LAB — POCT URINALYSIS DIPSTICK
LEUKOCYTES UA: NEGATIVE
NITRITE UA: NEGATIVE
Protein, UA: NEGATIVE
RBC UA: NEGATIVE

## 2016-03-02 NOTE — Telephone Encounter (Signed)
[facility] every [facility] minutes, they monitored her for a little while and she stayed at 3 cm and contractions did not progress so they sent her home.  Today contractions are getting a little more intense and pt wants to come here and have Dr. Despina HiddenEure check her before she drives [facility] the way [facility].  Pt instructed to be here @ 3, pt verbalized understanding.

## 2016-03-02 NOTE — Progress Notes (Signed)
[employer]:  Stage managerChief Complaint  Patient presents with  . WI in OB    contractions 5-7 min apart    Blood pressure (!) 144/92, pulse 90, weight 175 lb (79.4 kg), last menstrual period 06/10/2015.   Pt with irregular contractions the last day or so Went to ED last night and was 3/60/-2, BP was ok  Today she has had back ache and pelvic pressure  SVE 3/60/-2   Keep scheduled appointment Labor precautions given

## 2016-03-03 ENCOUNTER — Telehealth (HOSPITAL_COMMUNITY): Payer: Self-pay | Admitting: *Deleted

## 2016-03-03 NOTE — Telephone Encounter (Signed)
Preadmission screen  

## 2016-03-05 ENCOUNTER — Ambulatory Visit (INDEPENDENT_AMBULATORY_CARE_PROVIDER_SITE_OTHER): Payer: Medicaid Other

## 2016-03-05 ENCOUNTER — Ambulatory Visit (INDEPENDENT_AMBULATORY_CARE_PROVIDER_SITE_OTHER): Payer: Medicaid Other | Admitting: Obstetrics & Gynecology

## 2016-03-05 VITALS — BP 144/96 | HR 86 | Wt 174.0 lb

## 2016-03-05 DIAGNOSIS — O10912 Unspecified pre-existing hypertension complicating pregnancy, second trimester: Secondary | ICD-10-CM

## 2016-03-05 DIAGNOSIS — Z3A39 39 weeks gestation of pregnancy: Secondary | ICD-10-CM

## 2016-03-05 DIAGNOSIS — O10919 Unspecified pre-existing hypertension complicating pregnancy, unspecified trimester: Secondary | ICD-10-CM

## 2016-03-05 DIAGNOSIS — O09893 Supervision of other high risk pregnancies, third trimester: Secondary | ICD-10-CM

## 2016-03-05 DIAGNOSIS — O10913 Unspecified pre-existing hypertension complicating pregnancy, third trimester: Secondary | ICD-10-CM

## 2016-03-05 DIAGNOSIS — Z1389 Encounter for screening for other disorder: Secondary | ICD-10-CM

## 2016-03-05 DIAGNOSIS — Z331 Pregnant state, incidental: Secondary | ICD-10-CM | POA: Diagnosis not present

## 2016-03-05 LAB — POCT URINALYSIS DIPSTICK
Blood, UA: NEGATIVE
Glucose, UA: NEGATIVE
Ketones, UA: NEGATIVE
LEUKOCYTES UA: NEGATIVE
Nitrite, UA: NEGATIVE

## 2016-03-05 NOTE — Progress Notes (Signed)
Fetal Surveillance Testing today:  BPP 8/8 with good Doppler flow   High Risk Pregnancy Diagnosis(es):   CHTN  [id number] Estimated Date of Delivery: 03/16/16  Blood pressure (!) 144/96, pulse 86, weight 174 lb (78.9 kg), last menstrual period 06/10/2015.  Urinalysis: Negative   HPI: The patient is being seen today for ongoing management of CHTN. Today she reports BP at home are creeping up again   BP weight and urine results all reviewed and noted. Patient reports good fetal movement, denies any bleeding and no rupture of membranes symptoms or regular contractions.  Fundal Height:  37 Fetal Heart rate:  127 Edema:  trace  Patient is without complaints other than noted in her HPI. All questions were answered.  All lab and sonogram results have been reviewed. Comments:    Assessment:  1.  Pregnancy at 7371w3d,  Estimated Date of Delivery: 03/16/16 :                          2.  CHTN                        3.    Medication(s) Plans: increase to Labetalol 300 TID  Treatment Plan:  Induction Tuesday  No Follow-up on file. for appointment for high risk OB care  No orders of the defined types were placed in this encounter.  Orders Placed This Encounter  Procedures  . POCT urinalysis dipstick

## 2016-03-05 NOTE — Progress Notes (Signed)
US 38+3 wks,cephalic,post pl gr 3,afi 11.2cm,fhr 127 bpm,RI .57,.55,normal ov's bilat,BPP 8/8

## 2016-03-06 ENCOUNTER — Inpatient Hospital Stay (HOSPITAL_COMMUNITY)
Admission: AD | Admit: 2016-03-06 | Discharge: 2016-03-08 | DRG: 774 | Disposition: A | Discharge status: Home / Self Care | Payer: Medicaid Other | Source: Ambulatory Visit | Attending: Obstetrics and Gynecology | Admitting: Obstetrics and Gynecology

## 2016-03-06 ENCOUNTER — Encounter (HOSPITAL_COMMUNITY): Payer: Self-pay | Admitting: Certified Nurse Midwife

## 2016-03-06 DIAGNOSIS — Z3483 Encounter for supervision of other normal pregnancy, third trimester: Secondary | ICD-10-CM | POA: Diagnosis present

## 2016-03-06 DIAGNOSIS — O1002 Pre-existing essential hypertension complicating childbirth: Secondary | ICD-10-CM | POA: Diagnosis present

## 2016-03-06 DIAGNOSIS — Z8249 Family history of ischemic heart disease and other diseases of the circulatory system: Secondary | ICD-10-CM

## 2016-03-06 DIAGNOSIS — Z823 Family history of stroke: Secondary | ICD-10-CM | POA: Diagnosis not present

## 2016-03-06 DIAGNOSIS — Z3A38 38 weeks gestation of pregnancy: Secondary | ICD-10-CM | POA: Diagnosis not present

## 2016-03-06 DIAGNOSIS — O114 Pre-existing hypertension with pre-eclampsia, complicating childbirth: Secondary | ICD-10-CM | POA: Diagnosis present

## 2016-03-06 DIAGNOSIS — O99824 Streptococcus B carrier state complicating childbirth: Secondary | ICD-10-CM | POA: Diagnosis present

## 2016-03-06 DIAGNOSIS — Z87891 Personal history of nicotine dependence: Secondary | ICD-10-CM | POA: Diagnosis not present

## 2016-03-06 DIAGNOSIS — Z833 Family history of diabetes mellitus: Secondary | ICD-10-CM

## 2016-03-06 DIAGNOSIS — O09893 Supervision of other high risk pregnancies, third trimester: Secondary | ICD-10-CM

## 2016-03-06 LAB — PROTEIN / CREATININE RATIO, URINE
Creatinine, Urine: 58 mg/dL
Protein Creatinine Ratio: 0.26 mg/mg{Cre} — ABNORMAL HIGH (ref 0.00–0.15)
Total Protein, Urine: 15 mg/dL

## 2016-03-06 LAB — COMPREHENSIVE METABOLIC PANEL
ALBUMIN: 3.3 g/dL — AB (ref 3.5–5.0)
ALT: 16 U/L (ref 14–54)
AST: 17 U/L (ref 15–41)
Alkaline Phosphatase: 122 U/L (ref 38–126)
Anion gap: 9 (ref 5–15)
BILIRUBIN TOTAL: 0.5 mg/dL (ref 0.3–1.2)
CHLORIDE: 108 mmol/L (ref 101–111)
CO2: 20 mmol/L — AB (ref 22–32)
Calcium: 8.6 mg/dL — ABNORMAL LOW (ref 8.9–10.3)
Creatinine, Ser: 0.53 mg/dL (ref 0.44–1.00)
GFR calc Af Amer: >60 mL/min (ref >60)
GFR calc non Af Amer: >60 mL/min (ref >60)
GLUCOSE: 83 mg/dL (ref 65–99)
POTASSIUM: 3.4 mmol/L — AB (ref 3.5–5.1)
SODIUM: 137 mmol/L (ref 135–145)
Total Protein: 6.4 g/dL — ABNORMAL LOW (ref 6.5–8.1)

## 2016-03-06 LAB — CBC
HEMATOCRIT: 35.8 % — AB (ref 36.0–46.0)
Hemoglobin: 11.9 g/dL — ABNORMAL LOW (ref 12.0–15.0)
MCH: 29.3 pg (ref 26.0–34.0)
MCHC: 33.2 g/dL (ref 30.0–36.0)
MCV: 88.2 fL (ref 78.0–100.0)
PLATELETS: 280 10*3/uL (ref 150–400)
RBC: 4.06 MIL/uL (ref 3.87–5.11)
RDW: 14.9 % (ref 11.5–15.5)
WBC: 9.4 10*3/uL (ref 4.0–10.5)

## 2016-03-06 MED ORDER — ONDANSETRON HCL 4 MG/2ML IJ SOLN
4.0000 mg | Freq: Four times a day (QID) | INTRAMUSCULAR | Status: DC | PRN
  Administered 2016-03-06: 4 mg via INTRAVENOUS
  Filled 2016-03-06: qty 2

## 2016-03-06 MED ORDER — EPHEDRINE 5 MG/ML INJ
10.0000 mg | INTRAVENOUS | Status: DC | PRN
  Filled 2016-03-06: qty 4

## 2016-03-06 MED ORDER — SOD CITRATE-CITRIC ACID 500-334 MG/5ML PO SOLN: 30.0000 mL | ORAL | Status: DC | PRN

## 2016-03-06 MED ORDER — LACTATED RINGERS IV SOLN: 500.0000 mL | Freq: Once | INTRAVENOUS | Status: DC

## 2016-03-06 MED ORDER — PHENYLEPHRINE 40 MCG/ML (10ML) SYRINGE FOR IV PUSH (FOR BLOOD PRESSURE SUPPORT)
80.0000 ug | PREFILLED_SYRINGE | INTRAVENOUS | Status: DC | PRN
  Filled 2016-03-06: qty 5

## 2016-03-06 MED ORDER — OXYTOCIN 40 UNITS IN LACTATED RINGERS INFUSION - SIMPLE MED
2.5000 [IU]/h | INTRAVENOUS | Status: DC
  Administered 2016-03-07: 2.5 [IU]/h via INTRAVENOUS

## 2016-03-06 MED ORDER — LACTATED RINGERS IV SOLN
INTRAVENOUS | Status: DC
  Administered 2016-03-06: 21:00:00 via INTRAVENOUS

## 2016-03-06 MED ORDER — DEXTROSE 5 % IV SOLN
2.5000 10*6.[IU] | INTRAVENOUS | Status: DC
  Administered 2016-03-07 (×2): 2.5 10*6.[IU] via INTRAVENOUS
  Filled 2016-03-06 (×4): qty 2.5

## 2016-03-06 MED ORDER — FENTANYL 2.5 MCG/ML BUPIVACAINE 1/10 % EPIDURAL INFUSION (WH - ANES)
14.0000 mL/h | INTRAMUSCULAR | Status: DC | PRN
  Administered 2016-03-07 (×2): 14 mL/h via EPIDURAL
  Filled 2016-03-06 (×2): qty 125

## 2016-03-06 MED ORDER — OXYTOCIN 40 UNITS IN LACTATED RINGERS INFUSION - SIMPLE MED
1.0000 m[IU]/min | INTRAVENOUS | Status: DC
  Administered 2016-03-06: 2 m[IU]/min via INTRAVENOUS
  Filled 2016-03-06: qty 1000

## 2016-03-06 MED ORDER — LACTATED RINGERS IV SOLN: 500.0000 mL | INTRAVENOUS | Status: DC | PRN

## 2016-03-06 MED ORDER — TERBUTALINE SULFATE 1 MG/ML IJ SOLN
0.2500 mg | Freq: Once | INTRAMUSCULAR | Status: DC | PRN
  Filled 2016-03-06: qty 1

## 2016-03-06 MED ORDER — OXYCODONE-ACETAMINOPHEN 5-325 MG PO TABS: 2.0000 | ORAL_TABLET | ORAL | Status: DC | PRN

## 2016-03-06 MED ORDER — DIPHENHYDRAMINE HCL 50 MG/ML IJ SOLN: 12.5000 mg | INTRAMUSCULAR | Status: DC | PRN

## 2016-03-06 MED ORDER — OXYCODONE-ACETAMINOPHEN 5-325 MG PO TABS: 1.0000 | ORAL_TABLET | ORAL | Status: DC | PRN

## 2016-03-06 MED ORDER — PENICILLIN G POTASSIUM 5000000 UNITS IJ SOLR
5.0000 10*6.[IU] | Freq: Once | INTRAVENOUS | Status: AC
  Administered 2016-03-06: 5 10*6.[IU] via INTRAVENOUS
  Filled 2016-03-06: qty 5

## 2016-03-06 MED ORDER — PHENYLEPHRINE 40 MCG/ML (10ML) SYRINGE FOR IV PUSH (FOR BLOOD PRESSURE SUPPORT)
80.0000 ug | PREFILLED_SYRINGE | INTRAVENOUS | Status: DC | PRN
  Filled 2016-03-06: qty 10
  Filled 2016-03-06: qty 5

## 2016-03-06 MED ORDER — OXYTOCIN BOLUS FROM INFUSION: 500.0000 mL | Freq: Once | INTRAVENOUS | Status: DC

## 2016-03-06 MED ORDER — ACETAMINOPHEN 325 MG PO TABS: 650.0000 mg | ORAL_TABLET | ORAL | Status: DC | PRN

## 2016-03-06 MED ORDER — LIDOCAINE HCL (PF) 1 % IJ SOLN
30.0000 mL | INTRAMUSCULAR | Status: DC | PRN
  Filled 2016-03-06: qty 30

## 2016-03-06 MED ORDER — HYDRALAZINE HCL 20 MG/ML IJ SOLN: 10.0000 mg | Freq: Once | INTRAMUSCULAR | Status: DC | PRN

## 2016-03-06 MED ORDER — LABETALOL HCL 5 MG/ML IV SOLN
20.0000 mg | INTRAVENOUS | Status: DC | PRN
Start: 2016-03-06 — End: 2016-03-07

## 2016-03-06 NOTE — H&P (Signed)
Chief Complaint:  Hypertension; Headache; and Contractions   First Provider Initiated Contact with Patient 03/06/16 1900     HPI: Marissa Reese is a 35 y.o. G3P2002 at 4438w4dwho presents to maternity admissions reporting contractions, elevated blood pressure and headache today.  Has chronic hypertension but usually runs around 140/90.  Takes Labetalol 300mg  tid.  Today it has been 150/100.Marland Kitchen. She reports good fetal movement, denies LOF, vaginal bleeding, vaginal itching/burning, urinary symptoms, dizziness, n/v, diarrhea, constipation or fever/chills.  She denies visual changes or RUQ abdominal pain.  Hypertension  This is a recurrent problem. The current episode started more than 1 month ago. The problem has been gradually worsening since onset. Associated symptoms include headaches. Pertinent negatives include no blurred vision, malaise/fatigue, palpitations, peripheral edema or shortness of breath. There are no associated agents to hypertension. There are no known risk factors for coronary artery disease. Past treatments include beta blockers. There are no compliance problems.   Headache   This is a new problem. The current episode started today. The problem occurs constantly. The problem has been unchanged. The pain is located in the bilateral and frontal region. The pain does not radiate. The quality of the pain is described as aching and dull. Associated symptoms include abdominal pain. Pertinent negatives include no back pain, blurred vision, fever, muscle aches, nausea, photophobia, tingling, visual change, vomiting or weakness. Nothing aggravates the symptoms. She has tried nothing for the symptoms. Her past medical history is significant for hypertension.   RN note: Pt states she has been having ctxs all day. Pt denies LOF or vaginal bleeding. Fetus is active. Pt has been taking BP at home and it is high today. Pt states she has a HA but denies blurry vision or epigastric pain  Past  Medical History: Past Medical History:  Diagnosis Date  . Anxiety   . Depression   . Essential hypertension    Medication since 04/2015  . History of headache   . Hypertension    chronic, was on Lisinopril prior to preg.  . Pregnant 07/17/2015  . Vaginal itching 08/19/2015  . Yeast infection 08/19/2015    Past obstetric history: OB History  Gravida Para Term Preterm AB Living  3 2 2     2   SAB TAB Ectopic Multiple Live Births          2    # Outcome Date GA Lbr Len/2nd Weight Sex Delivery Anes PTL Lv  3 Current           2 Term 12/21/09 [id number]  6 lb 11 oz (3.033 kg) F Vag-Spont Pud N LIV  1 Term 08/04/07 3753w0d  5 lb 5 oz (2.41 kg) F Vag-Spont EPI N LIV      Past Surgical History: Past Surgical History:  Procedure Laterality Date  . CHOLECYSTECTOMY      Family History: Family History  Problem Relation Age of Onset  . Diabetes Father   . Heart attack Father     Age 34  . Hypertension Father   . Hypertension Mother   . Hyperlipidemia Mother   . Heart attack Maternal Grandfather   . Heart attack Paternal Grandmother   . Stroke Paternal Grandmother   . Heart attack Paternal Grandfather     Social History: Social History  Substance Use Topics  . Smoking status: Former Smoker    Packs/day: 0.00    Years: 17.00    Types: Cigarettes    Start date: 07/14/1991    Quit date:  07/13/2015  . Smokeless tobacco: Never Used  . Alcohol use No     Comment: Occasional; not now    Allergies:  Allergies  Allergen Reactions  . Zolpidem Tartrate Other (See Comments)    Reaction:  Hallucinations   . Latex Itching and Rash    Meds:  Prescriptions Prior to Admission  Medication Sig Dispense Refill Last Dose  . acetaminophen (TYLENOL) 500 MG tablet Take 2 tablets (1,000 mg total) by mouth every 6 (six) hours as needed for moderate pain. 30 tablet 0 Taking  . aspirin EC 81 MG tablet Take 81 mg by mouth at bedtime.    Taking  . buPROPion (WELLBUTRIN XL) 300 MG 24 hr tablet Take  300 mg by mouth daily.   Taking  . ferrous sulfate 325 (65 FE) MG tablet Take 325 mg by mouth daily with breakfast.   Taking  . labetalol (NORMODYNE) 200 MG tablet Take 1 tablet (200 mg total) by mouth 3 (three) times daily. 90 tablet 2 Taking  . NIFEdipine (PROCARDIA-XL/ADALAT-CC/NIFEDICAL-XL) 30 MG 24 hr tablet Take 1 tablet (30 mg total) by mouth daily. For 2 days then 1 tablet PO qday prn for mild contractions. 7 tablet 0 Taking  . Prenat-FeFmCb-DSS-FA-DHA w/o A (CITRANATAL HARMONY) 27-1-260 MG CAPS Take 1 capsule by mouth 2 (two) times daily.   Taking    I have reviewed patient's Past Medical Hx, Surgical Hx, Family Hx, Social Hx, medications and allergies.   ROS:  Review of Systems  Constitutional: Negative for fever and malaise/fatigue.  Eyes: Negative for blurred vision and photophobia.  Respiratory: Negative for shortness of breath.   Cardiovascular: Negative for palpitations.  Gastrointestinal: Positive for abdominal pain. Negative for nausea and vomiting.  Musculoskeletal: Negative for back pain.  Neurological: Positive for headaches. Negative for tingling and weakness.   Other systems negative  Physical Exam  Patient Vitals for the past 24 hrs:  BP Temp Temp src Pulse Resp  03/06/16 1851 (!) 155/102 97.5 F (36.4 C) Oral 87 18   Vitals:   03/06/16 1930 03/06/16 1945 03/06/16 2000 03/06/16 2015  BP: 157/88 139/95 137/91 154/95  Pulse: 85 90 86 84  Resp:      Temp:      TempSrc:        Constitutional: Well-developed, well-nourished female in no acute distress.  Cardiovascular: normal rate and rhythm Respiratory: normal effort, clear to auscultation bilaterally GI: Abd soft, non-tender, gravid appropriate for gestational age.   No rebound or guarding. MS: Extremities nontender, no edema, normal ROM Neurologic: Alert and oriented x 4.  DTRs 2+ with no clonus GU: Neg CVAT.  Dilation: 3 Effacement (%): 60 Station: -3 Presentation: Vertex Exam by:: AYetta Barre  RNC  FHT:  Baseline 140 , moderate variability, accelerations present, no decelerations Contractions:  Irregular     Labs: A/Negative/-- (02/28 1523) Results for orders placed or performed during the hospital encounter of 03/06/16 (from the past 24 hour(s))  Protein / creatinine ratio, urine     Status: Abnormal   Collection Time: 03/06/16  6:50 PM  Result Value Ref Range   Creatinine, Urine 58.00 mg/dL   Total Protein, Urine 15 mg/dL   Protein Creatinine Ratio 0.26 (H) 0.00 - 0.15 mg/mg[Cre]  CBC     Status: Abnormal   Collection Time: 03/06/16  6:55 PM  Result Value Ref Range   WBC 9.4 4.0 - 10.5 K/uL   RBC 4.06 3.87 - 5.11 MIL/uL   Hemoglobin 11.9 (L) 12.0 - 15.0  g/dL   HCT 40.9 (L) 81.1 - 91.4 %   MCV 88.2 78.0 - 100.0 fL   MCH 29.3 26.0 - 34.0 pg   MCHC 33.2 30.0 - 36.0 g/dL   RDW 78.2 95.6 - 21.3 %   Platelets 280 150 - 400 K/uL  Comprehensive metabolic panel     Status: Abnormal   Collection Time: 03/06/16  6:55 PM  Result Value Ref Range   Sodium 137 135 - 145 mmol/L   Potassium 3.4 (L) 3.5 - 5.1 mmol/L   Chloride 108 101 - 111 mmol/L   CO2 20 (L) 22 - 32 mmol/L   Glucose, Bld 83 65 - 99 mg/dL   BUN <5 (L) 6 - 20 mg/dL   Creatinine, Ser 0.86 0.44 - 1.00 mg/dL   Calcium 8.6 (L) 8.9 - 10.3 mg/dL   Total Protein 6.4 (L) 6.5 - 8.1 g/dL   Albumin 3.3 (L) 3.5 - 5.0 g/dL   AST 17 15 - 41 U/L   ALT 16 14 - 54 U/L   Alkaline Phosphatase 122 38 - 126 U/L   Total Bilirubin 0.5 0.3 - 1.2 mg/dL   GFR calc non Af Amer >60 >60 mL/min   GFR calc Af Amer >60 >60 mL/min   Anion gap 9 5 - 15    Imaging:  US Ob Follow Up  Result Date: 02/26/2016 FOLLOW UP SONOGRAM Marissa Reese is in the office for a follow up sonogram for EFW,BPP and cord doppler. She is a 35 y.o. year old G49P2002 with Estimated Date of Delivery: 03/16/16 by LMP now at  [redacted]w[redacted]d weeks gestation. Thus far the pregnancy has been complicated by chtn.. GESTATION: SINGLETON PRESENTATION: cephalic FETAL ACTIVITY:           Heart rate         160          The fetus is active. AMNIOTIC FLUID: The amniotic fluid volume is  normal, 13 cm. PLACENTA LOCALIZATION:  posterior GRADE 2 CERVIX: Limited view ADNEXA: The ovaries are normal. GESTATIONAL AGE AND  BIOMETRICS: Gestational criteria: Estimated Date of Delivery: 03/16/16 by LMP now at [redacted]w[redacted]d Previous Scans:8          BIPARIETAL DIAMETER           8.89 cm         36 weeks HEAD CIRCUMFERENCE           33.24 cm         37+6 weeks ABDOMINAL CIRCUMFERENCE           33.45 cm         37+2 weeks FEMUR LENGTH           7.22 cm         37 weeks                                                       AVERAGE EGA(BY THIS SCAN):  37 weeks                                                 ESTIMATED FETAL WEIGHT:       3189  grams, 57 % BIOPHYSCIAL  PROFILE:                                                                                                      COMMENTS GROSS BODY MOVEMENT                 2  TONE                2  RESPIRATIONS                2  AMNIOTIC FLUID                2                                                          SCORE:  8/8 (Note: NST was not performed as part of this antepartum testing) DOPPLER FLOW STUDIES: UMBILICAL ARTERY RI RATIOS:   0.56, 0.63 ANATOMICAL SURVEY                                                                            COMMENTS CEREBRAL VENTRICLES yes normal  CHOROID PLEXUS yes normal  CEREBELLUM    CISTERNA MAGNA    NUCHAL REGION    ORBITS    NASAL BONE yes normal  NOSE/LIP yes normal  FACIAL PROFILE yes normal  4 CHAMBERED HEART    OUTFLOW TRACTS    DIAPHRAGM yes normal  STOMACH yes normal  RENAL REGION yes normal  BLADDER yes normal  CORD INSERTION    3 VESSEL CORD yes normal  SPINE    ARMS/HANDS    LEGS/FEET    GENITALIA yes normal female     SUSPECTED ABNORMALITIES:  no QUALITY OF SCAN: satisfactory TECHNICIAN COMMENTS: [place] 37+2 wks,cephalic,post pl gr 2,normal ov's bilat,BPP 8/8,FHR 160 bpm, afi 13 cm,EFW 3189 g 57%,RI .56,.63 A copy of this  report including all images has been saved and backed up to a second source for retrieval if needed. All measures and details of the anatomical scan, placentation, fluid volume and pelvic anatomy are contained in that report. Amber Flora Lipps 02/26/2016 10:47 AM Clinical Impression and recommendations: I have reviewed the sonogram results above, combined with the patient's current clinical course, below are my impressions and any appropriate recommendations for management based on the sonographic findings. 1.  [id number] Estimated Date of Delivery: 03/16/16 by serial sonographic evaluations 2.  Fetal sonographic surveillance findings: a). Normal fluid volume b). Normal antepartum fetal assessment with BPP 8/8 c). Normal fetal Doppler ratios with consistent diastolic flow d). Normal growth percentile with appropriate interval growth, 57% 3.  Normal general  sonographic findings Recommend continued prenatal evaluations and care based on this sonogram and as clinically indicated from the patient's clinical course. Lazaro Arms 02/26/2016 11:05 AM   US Ob Follow Up  Result Date: 02/09/2016 FOLLOW UP SONOGRAM DEYA BIGOS is in the office for a follow up sonogram for BPP,EFW, and cord doppler. She is a 35 y.o. year old G64P2002 with Estimated Date of Delivery: 03/16/16 by LMP now at  [redacted]w[redacted]d weeks gestation. Thus far the pregnancy has been complicated by [facility].. GESTATION: SINGLETON PRESENTATION: cephalic FETAL ACTIVITY:          Heart rate         130          The fetus is active. AMNIOTIC FLUID: The amniotic fluid volume is  normal, 10 cm. PLACENTA LOCALIZATION:  posterior GRADE 1 CERVIX: Limited view ADNEXA: The ovaries are normal. GESTATIONAL AGE AND  BIOMETRICS: Gestational criteria: Estimated Date of Delivery: 03/16/16 by LMP now at [redacted]w[redacted]d Previous Scans:6          BIPARIETAL DIAMETER           8.54 cm         34+3 weeks HEAD CIRCUMFERENCE           31.75 cm         35+5 weeks ABDOMINAL CIRCUMFERENCE           31.50 cm          35+3 weeks FEMUR LENGTH           6.82 cm         35 weeks                                                       AVERAGE EGA(BY THIS SCAN):  35+1 weeks                                                 ESTIMATED FETAL WEIGHT:       2691  grams, 57 % BIOPHYSCIAL PROFILE:                                                                                                      COMMENTS GROSS BODY MOVEMENT                 2  TONE                2  RESPIRATIONS                2  AMNIOTIC FLUID                2  SCORE:  8/8 (Note: NST was not performed as part of this antepartum testing) DOPPLER FLOW STUDIES: UMBILICAL ARTERY RI RATIOS:   0.66, 0.63 ANATOMICAL SURVEY                                                                            COMMENTS CEREBRAL VENTRICLES yes normal  CHOROID PLEXUS yes normal  CEREBELLUM    CISTERNA MAGNA    NUCHAL REGION    ORBITS    NASAL BONE    NOSE/LIP    FACIAL PROFILE yes normal  4 CHAMBERED HEART yes normal  OUTFLOW TRACTS    DIAPHRAGM yes normal  STOMACH yes normal  RENAL REGION yes normal  BLADDER yes normal  CORD INSERTION    3 VESSEL CORD yes normal  SPINE    ARMS/HANDS    LEGS/FEET    GENITALIA yes normal female     SUSPECTED ABNORMALITIES:  no QUALITY OF SCAN: satisfactory TECHNICIAN COMMENTS: [place] 34+6 wks,cephalic,fhr 130 bpm,BPP 8/8,RI .66,.63,post pl gr 1,normal ov's bilat,efw 2691 g 57%,afi 10 cm A copy of this report including all images has been saved and backed up to a second source for retrieval if needed. All measures and details of the anatomical scan, placentation, fluid volume and pelvic anatomy are contained in that report. Amber Flora Lipps 02/09/2016 4:16 PM Clinical Impression and recommendations: I have reviewed the sonogram results above, combined with the patient's current clinical course, below are my impressions and any appropriate recommendations for management based on the sonographic findings. 1.  [id number]  Estimated Date of Delivery: 03/16/16 by serial sonographic evaluations 2.  Fetal sonographic surveillance findings: a). Normal fluid volume b). Normal antepartum fetal assessment with BPP 8/8 c). Normal fetal Doppler ratios with consistent diastolic flow d). Normal growth percentile with appropriate interval growth, 57% 3.  Normal general sonographic findings Recommend continued prenatal evaluations and care based on this sonogram and as clinically indicated from the patient's clinical course. Lazaro Arms 02/09/2016 4:39 PM   US Fetal Bpp W/o Non Stress  Result Date: 03/05/2016 FOLLOW UP SONOGRAM Marissa Reese is in the office for a follow up sonogram for BPP and cord doppler. She is a 35 y.o. year old G40P2002 with Estimated Date of Delivery: 03/16/16 by LMP now at  [redacted]w[redacted]d weeks gestation. Thus far the pregnancy has been complicated by [facility]. GESTATION: SINGLETON PRESENTATION: cephalic FETAL ACTIVITY:          Heart rate         127 BPM          The fetus is active. AMNIOTIC FLUID: The amniotic fluid volume is  normal, 11.2 cm. PLACENTA LOCALIZATION:  posterior GRADE 3 CERVIX: Limited view ADNEXA: The ovaries are normal. GESTATIONAL AGE AND  BIOMETRICS: Gestational criteria: Estimated Date of Delivery: 03/16/16 by LMP now at [redacted]w[redacted]d Previous Scans:9 BIOPHYSCIAL PROFILE:  COMMENTS GROSS BODY MOVEMENT                 2  TONE                2  RESPIRATIONS                2  AMNIOTIC FLUID                2                                                          SCORE:  8/8 (Note: NST was not performed as part of this antepartum testing) DOPPLER FLOW STUDIES: UMBILICAL ARTERY RI RATIOS:   0.55, 0.57 ANATOMICAL SURVEY                                                                            COMMENTS CEREBRAL VENTRICLES    CHOROID PLEXUS    CEREBELLUM    CISTERNA MAGNA    NUCHAL REGION    ORBITS    NASAL BONE yes  normal  NOSE/LIP yes normal  FACIAL PROFILE yes normal  4 CHAMBERED HEART yes normal  OUTFLOW TRACTS yes normal  DIAPHRAGM yes normal  STOMACH yes normal  RENAL REGION yes normal  BLADDER yes normal  CORD INSERTION    3 VESSEL CORD yes normal  SPINE    ARMS/HANDS    LEGS/FEET    GENITALIA   female     SUSPECTED ABNORMALITIES:  no QUALITY OF SCAN: satisfactory TECHNICIAN COMMENTS: [place] 38+3 wks,cephalic,post pl gr 3,afi 11.2cm,fhr 127 bpm,RI .57,.55,normal ov's bilat,BPP 8/8 A copy of this report including all images has been saved and backed up to a second source for retrieval if needed. All measures and details of the anatomical scan, placentation, fluid volume and pelvic anatomy are contained in that report. Amber Flora Lipps 03/05/2016 12:49 PM Clinical Impression and recommendations: I have reviewed the sonogram results above, combined with the patient's current clinical course, below are my impressions and any appropriate recommendations for management based on the sonographic findings. 1.  [id number] Estimated Date of Delivery: 03/16/16 by serial sonographic evaluations 2.  Fetal sonographic surveillance findings: a). Normal fluid volume b). Normal antepartum fetal assessment with BPP 8/8 c). Normal fetal Doppler ratios with consistent diastolic flow 3.  Normal general sonographic findings Recommend continued prenatal evaluations and care based on this sonogram and as clinically indicated from the patient's clinical course. Lazaro Arms 03/05/2016 12:54 PM   US Fetal Bpp W/o Non Stress  Result Date: 02/26/2016 FOLLOW UP SONOGRAM Marissa Reese is in the office for a follow up sonogram for EFW,BPP and cord doppler. She is a 35 y.o. year old G45P2002 with Estimated Date of Delivery: 03/16/16 by LMP now at  [redacted]w[redacted]d weeks gestation. Thus far the pregnancy has been complicated by chtn.. GESTATION: SINGLETON PRESENTATION: cephalic FETAL ACTIVITY:          Heart rate         160  The fetus is active. AMNIOTIC FLUID:  The amniotic fluid volume is  normal, 13 cm. PLACENTA LOCALIZATION:  posterior GRADE 2 CERVIX: Limited view ADNEXA: The ovaries are normal. GESTATIONAL AGE AND  BIOMETRICS: Gestational criteria: Estimated Date of Delivery: 03/16/16 by LMP now at [redacted]w[redacted]d Previous Scans:8          BIPARIETAL DIAMETER           8.89 cm         36 weeks HEAD CIRCUMFERENCE           33.24 cm         37+6 weeks ABDOMINAL CIRCUMFERENCE           33.45 cm         37+2 weeks FEMUR LENGTH           7.22 cm         37 weeks                                                       AVERAGE EGA(BY THIS SCAN):  37 weeks                                                 ESTIMATED FETAL WEIGHT:       3189  grams, 57 % BIOPHYSCIAL PROFILE:                                                                                                      COMMENTS GROSS BODY MOVEMENT                 2  TONE                2  RESPIRATIONS                2  AMNIOTIC FLUID                2                                                          SCORE:  8/8 (Note: NST was not performed as part of this antepartum testing) DOPPLER FLOW STUDIES: UMBILICAL ARTERY RI RATIOS:   0.56, 0.63 ANATOMICAL SURVEY  COMMENTS CEREBRAL VENTRICLES yes normal  CHOROID PLEXUS yes normal  CEREBELLUM    CISTERNA MAGNA    NUCHAL REGION    ORBITS    NASAL BONE yes normal  NOSE/LIP yes normal  FACIAL PROFILE yes normal  4 CHAMBERED HEART    OUTFLOW TRACTS    DIAPHRAGM yes normal  STOMACH yes normal  RENAL REGION yes normal  BLADDER yes normal  CORD INSERTION    3 VESSEL CORD yes normal  SPINE    ARMS/HANDS    LEGS/FEET    GENITALIA yes normal female     SUSPECTED ABNORMALITIES:  no QUALITY OF SCAN: satisfactory TECHNICIAN COMMENTS: [place] 37+2 wks,cephalic,post pl gr 2,normal ov's bilat,BPP 8/8,FHR 160 bpm, afi 13 cm,EFW 3189 g 57%,RI .56,.63 A copy of this report including all images has been saved and backed up to a second source for  retrieval if needed. All measures and details of the anatomical scan, placentation, fluid volume and pelvic anatomy are contained in that report. Amber Flora Lipps 02/26/2016 10:47 AM Clinical Impression and recommendations: I have reviewed the sonogram results above, combined with the patient's current clinical course, below are my impressions and any appropriate recommendations for management based on the sonographic findings. 1.  [id number] Estimated Date of Delivery: 03/16/16 by serial sonographic evaluations 2.  Fetal sonographic surveillance findings: a). Normal fluid volume b). Normal antepartum fetal assessment with BPP 8/8 c). Normal fetal Doppler ratios with consistent diastolic flow d). Normal growth percentile with appropriate interval growth, 57% 3.  Normal general sonographic findings Recommend continued prenatal evaluations and care based on this sonogram and as clinically indicated from the patient's clinical course. EURE,LUTHER H 02/26/2016 11:05 AM   US Fetal Bpp W/o Non Stress  Result Date: 02/20/2016 FOLLOW UP SONOGRAM Marissa Reese is in the office for a follow up sonogram for BPP and cord doppler. She is a 35 y.o. year old G31P2002 with Estimated Date of Delivery: 03/16/16 by LMP now at  [redacted]w[redacted]d weeks gestation. Thus far the pregnancy has been complicated by [facility] GESTATION: SINGLETON PRESENTATION: cephalic FETAL ACTIVITY:          Heart rate         137          The fetus is active. AMNIOTIC FLUID: The amniotic fluid volume is  normal, 11 cm. PLACENTA LOCALIZATION:  posterior GRADE 2 CERVIX: Limited view ADNEXA: The ovaries are normal. GESTATIONAL AGE AND  BIOMETRICS: Gestational criteria: Estimated Date of Delivery: 03/16/16 by LMP now at [redacted]w[redacted]d Previous Scans:7. BIOPHYSCIAL PROFILE:                                                                                                      COMMENTS GROSS BODY MOVEMENT                 2  TONE                2  RESPIRATIONS                2  AMNIOTIC FLUID  2                                                          SCORE:  8/8 (Note: NST was not performed as part of this antepartum testing) DOPPLER FLOW STUDIES: UMBILICAL ARTERY RI RATIOS:   0.60, 0.61 ANATOMICAL SURVEY                                                                            COMMENTS CEREBRAL VENTRICLES yes normal  CHOROID PLEXUS yes normal  CEREBELLUM    CISTERNA MAGNA    NUCHAL REGION    ORBITS    NASAL BONE    NOSE/LIP yes normal  FACIAL PROFILE yes normal  4 CHAMBERED HEART yes normal  OUTFLOW TRACTS yes normal  DIAPHRAGM yes normal  STOMACH yes normal  RENAL REGION yes normal  BLADDER yes normal  CORD INSERTION    3 VESSEL CORD yes normal  SPINE    ARMS/HANDS    LEGS/FEET    GENITALIA yes normal female     SUSPECTED ABNORMALITIES:  no QUALITY OF SCAN: satisfactory TECHNICIAN COMMENTS: [place] 36+3 wks,cephalic,BPP 8/8,fhr 137 bpm,normal ov's bilat,RI .60,.61,AFI 11 cm,post pl gr 2 A copy of this report including all images has been saved and backed up to a second source for retrieval if needed. All measures and details of the anatomical scan, placentation, fluid volume and pelvic anatomy are contained in that report. Amber Flora Lipps 02/20/2016 9:44 AM Clinical Impression and recommendations: I have reviewed the sonogram results above, combined with the patient's current clinical course, below are my impressions and any appropriate recommendations for management based on the sonographic findings. 1.  [id number] Estimated Date of Delivery: 03/16/16 by serial sonographic evaluations 2.  Fetal sonographic surveillance findings: a). Normal fluid volume b). Normal antepartum fetal assessment with BPP 8/8 c). Normal fetal Doppler ratios with consistent diastolic flow 3.  Normal general sonographic findings Recommend continued prenatal evaluations and care based on this sonogram and as clinically indicated from the patient's clinical course. EURE,LUTHER H 02/20/2016 10:01 AM   US Fetal Bpp W/o Non  Stress  Result Date: 02/09/2016 FOLLOW UP SONOGRAM Marissa Reese is in the office for a follow up sonogram for BPP,EFW, and cord doppler. She is a 35 y.o. year old G64P2002 with Estimated Date of Delivery: 03/16/16 by LMP now at  [redacted]w[redacted]d weeks gestation. Thus far the pregnancy has been complicated by [facility].. GESTATION: SINGLETON PRESENTATION: cephalic FETAL ACTIVITY:          Heart rate         130          The fetus is active. AMNIOTIC FLUID: The amniotic fluid volume is  normal, 10 cm. PLACENTA LOCALIZATION:  posterior GRADE 1 CERVIX: Limited view ADNEXA: The ovaries are normal. GESTATIONAL AGE AND  BIOMETRICS: Gestational criteria: Estimated Date of Delivery: 03/16/16 by LMP now at [redacted]w[redacted]d Previous Scans:6          BIPARIETAL DIAMETER  8.54 cm         34+3 weeks HEAD CIRCUMFERENCE           31.75 cm         35+5 weeks ABDOMINAL CIRCUMFERENCE           31.50 cm         35+3 weeks FEMUR LENGTH           6.82 cm         35 weeks                                                       AVERAGE EGA(BY THIS SCAN):  35+1 weeks                                                 ESTIMATED FETAL WEIGHT:       2691  grams, 57 % BIOPHYSCIAL PROFILE:                                                                                                      COMMENTS GROSS BODY MOVEMENT                 2  TONE                2  RESPIRATIONS                2  AMNIOTIC FLUID                2                                                          SCORE:  8/8 (Note: NST was not performed as part of this antepartum testing) DOPPLER FLOW STUDIES: UMBILICAL ARTERY RI RATIOS:   0.66, 0.63 ANATOMICAL SURVEY                                                                            COMMENTS CEREBRAL VENTRICLES yes normal  CHOROID PLEXUS yes normal  CEREBELLUM    CISTERNA MAGNA    NUCHAL REGION    ORBITS    NASAL BONE    NOSE/LIP    FACIAL PROFILE yes normal  4 CHAMBERED HEART yes normal  OUTFLOW TRACTS    DIAPHRAGM  yes normal  STOMACH yes  normal  RENAL REGION yes normal  BLADDER yes normal  CORD INSERTION    3 VESSEL CORD yes normal  SPINE    ARMS/HANDS    LEGS/FEET    GENITALIA yes normal female     SUSPECTED ABNORMALITIES:  no QUALITY OF SCAN: satisfactory TECHNICIAN COMMENTS: [place] 34+6 wks,cephalic,fhr 130 bpm,BPP 8/8,RI .66,.63,post pl gr 1,normal ov's bilat,efw 2691 g 57%,afi 10 cm A copy of this report including all images has been saved and backed up to a second source for retrieval if needed. All measures and details of the anatomical scan, placentation, fluid volume and pelvic anatomy are contained in that report. Amber Flora Lipps 02/09/2016 4:16 PM Clinical Impression and recommendations: I have reviewed the sonogram results above, combined with the patient's current clinical course, below are my impressions and any appropriate recommendations for management based on the sonographic findings. 1.  [id number] Estimated Date of Delivery: 03/16/16 by serial sonographic evaluations 2.  Fetal sonographic surveillance findings: a). Normal fluid volume b). Normal antepartum fetal assessment with BPP 8/8 c). Normal fetal Doppler ratios with consistent diastolic flow d). Normal growth percentile with appropriate interval growth, 57% 3.  Normal general sonographic findings Recommend continued prenatal evaluations and care based on this sonogram and as clinically indicated from the patient's clinical course. Lazaro Arms 02/09/2016 4:39 PM   [place] Ua Cord Doppler  Result Date: 03/05/2016 FOLLOW UP SONOGRAM Marissa Reese is in the office for a follow up sonogram for BPP and cord doppler. She is a 35 y.o. year old [id number] with Estimated Date of Delivery: 03/16/16 by LMP now at  [redacted]w[redacted]d weeks gestation. Thus far the pregnancy has been complicated by [facility]. GESTATION: SINGLETON PRESENTATION: cephalic FETAL ACTIVITY:          Heart rate         127 BPM          The fetus is active. AMNIOTIC FLUID: The amniotic fluid volume is  normal, 11.2 cm. PLACENTA LOCALIZATION:   posterior GRADE 3 CERVIX: Limited view ADNEXA: The ovaries are normal. GESTATIONAL AGE AND  BIOMETRICS: Gestational criteria: Estimated Date of Delivery: 03/16/16 by LMP now at [redacted]w[redacted]d Previous Scans:9 BIOPHYSCIAL PROFILE:                                                                                                      COMMENTS GROSS BODY MOVEMENT                 2  TONE                2  RESPIRATIONS                2  AMNIOTIC FLUID                2  SCORE:  8/8 (Note: NST was not performed as part of this antepartum testing) DOPPLER FLOW STUDIES: UMBILICAL ARTERY RI RATIOS:   0.55, 0.57 ANATOMICAL SURVEY                                                                            COMMENTS CEREBRAL VENTRICLES    CHOROID PLEXUS    CEREBELLUM    CISTERNA MAGNA    NUCHAL REGION    ORBITS    NASAL BONE yes normal  NOSE/LIP yes normal  FACIAL PROFILE yes normal  4 CHAMBERED HEART yes normal  OUTFLOW TRACTS yes normal  DIAPHRAGM yes normal  STOMACH yes normal  RENAL REGION yes normal  BLADDER yes normal  CORD INSERTION    3 VESSEL CORD yes normal  SPINE    ARMS/HANDS    LEGS/FEET    GENITALIA   female     SUSPECTED ABNORMALITIES:  no QUALITY OF SCAN: satisfactory TECHNICIAN COMMENTS: [place] 38+3 wks,cephalic,post pl gr 3,afi 11.2cm,fhr 127 bpm,RI .57,.55,normal ov's bilat,BPP 8/8 A copy of this report including all images has been saved and backed up to a second source for retrieval if needed. All measures and details of the anatomical scan, placentation, fluid volume and pelvic anatomy are contained in that report. Amber Flora Lipps 03/05/2016 12:49 PM Clinical Impression and recommendations: I have reviewed the sonogram results above, combined with the patient's current clinical course, below are my impressions and any appropriate recommendations for management based on the sonographic findings. 1.  [id number] Estimated Date of Delivery: 03/16/16 by serial sonographic evaluations  2.  Fetal sonographic surveillance findings: a). Normal fluid volume b). Normal antepartum fetal assessment with BPP 8/8 c). Normal fetal Doppler ratios with consistent diastolic flow 3.  Normal general sonographic findings Recommend continued prenatal evaluations and care based on this sonogram and as clinically indicated from the patient's clinical course. Lazaro Arms 03/05/2016 12:54 PM   [place] Ua Cord Doppler  Result Date: 02/26/2016 FOLLOW UP SONOGRAM Marissa Reese is in the office for a follow up sonogram for EFW,BPP and cord doppler. She is a 35 y.o. year old [id number] with Estimated Date of Delivery: 03/16/16 by LMP now at  [redacted]w[redacted]d weeks gestation. Thus far the pregnancy has been complicated by [facility].. GESTATION: SINGLETON PRESENTATION: cephalic FETAL ACTIVITY:          Heart rate         160          The fetus is active. AMNIOTIC FLUID: The amniotic fluid volume is  normal, 13 cm. PLACENTA LOCALIZATION:  posterior GRADE 2 CERVIX: Limited view ADNEXA: The ovaries are normal. GESTATIONAL AGE AND  BIOMETRICS: Gestational criteria: Estimated Date of Delivery: 03/16/16 by LMP now at [redacted]w[redacted]d Previous Scans:8          BIPARIETAL DIAMETER           8.89 cm         36 weeks HEAD CIRCUMFERENCE           33.24 cm         37+6 weeks ABDOMINAL CIRCUMFERENCE           33.45 cm  37+2 weeks FEMUR LENGTH           7.22 cm         37 weeks                                                       AVERAGE EGA(BY THIS SCAN):  37 weeks                                                 ESTIMATED FETAL WEIGHT:       3189  grams, 57 % BIOPHYSCIAL PROFILE:                                                                                                      COMMENTS GROSS BODY MOVEMENT                 2  TONE                2  RESPIRATIONS                2  AMNIOTIC FLUID                2                                                          SCORE:  8/8 (Note: NST was not performed as part of this antepartum testing) DOPPLER FLOW  STUDIES: UMBILICAL ARTERY RI RATIOS:   0.56, 0.63 ANATOMICAL SURVEY                                                                            COMMENTS CEREBRAL VENTRICLES yes normal  CHOROID PLEXUS yes normal  CEREBELLUM    CISTERNA MAGNA    NUCHAL REGION    ORBITS    NASAL BONE yes normal  NOSE/LIP yes normal  FACIAL PROFILE yes normal  4 CHAMBERED HEART    OUTFLOW TRACTS    DIAPHRAGM yes normal  STOMACH yes normal  RENAL REGION yes normal  BLADDER yes normal  CORD INSERTION    3 VESSEL CORD yes normal  SPINE    ARMS/HANDS    LEGS/FEET    GENITALIA yes normal female     SUSPECTED ABNORMALITIES:  no QUALITY OF SCAN: satisfactory TECHNICIAN COMMENTS:  [place] 37+2 wks,cephalic,post pl gr 2,normal ov's bilat,BPP 8/8,FHR 160 bpm, afi 13 cm,EFW 3189 g 57%,RI .56,.63 A copy of this report including all images has been saved and backed up to a second source for retrieval if needed. All measures and details of the anatomical scan, placentation, fluid volume and pelvic anatomy are contained in that report. Amber Flora Lipps 02/26/2016 10:47 AM Clinical Impression and recommendations: I have reviewed the sonogram results above, combined with the patient's current clinical course, below are my impressions and any appropriate recommendations for management based on the sonographic findings. 1.  [id number] Estimated Date of Delivery: 03/16/16 by serial sonographic evaluations 2.  Fetal sonographic surveillance findings: a). Normal fluid volume b). Normal antepartum fetal assessment with BPP 8/8 c). Normal fetal Doppler ratios with consistent diastolic flow d). Normal growth percentile with appropriate interval growth, 57% 3.  Normal general sonographic findings Recommend continued prenatal evaluations and care based on this sonogram and as clinically indicated from the patient's clinical course. Lazaro Arms 02/26/2016 11:05 AM   [place] Ua Cord Doppler  Result Date: 02/20/2016 FOLLOW UP SONOGRAM Marissa Reese is in the office for a follow  up sonogram for BPP and cord doppler. She is a 35 y.o. year old [id number] with Estimated Date of Delivery: 03/16/16 by LMP now at  [redacted]w[redacted]d weeks gestation. Thus far the pregnancy has been complicated by [facility]. GESTATION: SINGLETON PRESENTATION: cephalic FETAL ACTIVITY:          Heart rate         137          The fetus is active. AMNIOTIC FLUID: The amniotic fluid volume is  normal, 11 cm. PLACENTA LOCALIZATION:  posterior GRADE 2 CERVIX: Limited view ADNEXA: The ovaries are normal. GESTATIONAL AGE AND  BIOMETRICS: Gestational criteria: Estimated Date of Delivery: 03/16/16 by LMP now at [redacted]w[redacted]d Previous Scans:7. BIOPHYSCIAL PROFILE:                                                                                                      COMMENTS GROSS BODY MOVEMENT                 2  TONE                2  RESPIRATIONS                2  AMNIOTIC FLUID                2                                                          SCORE:  8/8 (Note: NST was not performed as part of this antepartum testing) DOPPLER FLOW STUDIES: UMBILICAL ARTERY RI RATIOS:   0.60, 0.61 ANATOMICAL SURVEY  COMMENTS CEREBRAL VENTRICLES yes normal  CHOROID PLEXUS yes normal  CEREBELLUM    CISTERNA MAGNA    NUCHAL REGION    ORBITS    NASAL BONE    NOSE/LIP yes normal  FACIAL PROFILE yes normal  4 CHAMBERED HEART yes normal  OUTFLOW TRACTS yes normal  DIAPHRAGM yes normal  STOMACH yes normal  RENAL REGION yes normal  BLADDER yes normal  CORD INSERTION    3 VESSEL CORD yes normal  SPINE    ARMS/HANDS    LEGS/FEET    GENITALIA yes normal female     SUSPECTED ABNORMALITIES:  no QUALITY OF SCAN: satisfactory TECHNICIAN COMMENTS: [place] 36+3 wks,cephalic,BPP 8/8,fhr 137 bpm,normal ov's bilat,RI .60,.61,AFI 11 cm,post pl gr 2 A copy of this report including all images has been saved and backed up to a second source for retrieval if needed. All measures and details of the anatomical scan,  placentation, fluid volume and pelvic anatomy are contained in that report. Amber Flora Lipps 02/20/2016 9:44 AM Clinical Impression and recommendations: I have reviewed the sonogram results above, combined with the patient's current clinical course, below are my impressions and any appropriate recommendations for management based on the sonographic findings. 1.  [id number] Estimated Date of Delivery: 03/16/16 by serial sonographic evaluations 2.  Fetal sonographic surveillance findings: a). Normal fluid volume b). Normal antepartum fetal assessment with BPP 8/8 c). Normal fetal Doppler ratios with consistent diastolic flow 3.  Normal general sonographic findings Recommend continued prenatal evaluations and care based on this sonogram and as clinically indicated from the patient's clinical course. Lazaro Arms 02/20/2016 10:01 AM   [place] Ua Cord Doppler  Result Date: 02/09/2016 FOLLOW UP SONOGRAM Marissa Reese is in the office for a follow up sonogram for BPP,EFW, and cord doppler. She is a 35 y.o. year old [id number] with Estimated Date of Delivery: 03/16/16 by LMP now at  [redacted]w[redacted]d weeks gestation. Thus far the pregnancy has been complicated by [facility].. GESTATION: SINGLETON PRESENTATION: cephalic FETAL ACTIVITY:          Heart rate         130          The fetus is active. AMNIOTIC FLUID: The amniotic fluid volume is  normal, 10 cm. PLACENTA LOCALIZATION:  posterior GRADE 1 CERVIX: Limited view ADNEXA: The ovaries are normal. GESTATIONAL AGE AND  BIOMETRICS: Gestational criteria: Estimated Date of Delivery: 03/16/16 by LMP now at [redacted]w[redacted]d Previous Scans:6          BIPARIETAL DIAMETER           8.54 cm         34+3 weeks HEAD CIRCUMFERENCE           31.75 cm         35+5 weeks ABDOMINAL CIRCUMFERENCE           31.50 cm         35+3 weeks FEMUR LENGTH           6.82 cm         35 weeks                                                       AVERAGE EGA(BY THIS SCAN):  35+1 weeks  ESTIMATED  FETAL WEIGHT:       2691  grams, 57 % BIOPHYSCIAL PROFILE:                                                                                                      COMMENTS GROSS BODY MOVEMENT                 2  TONE                2  RESPIRATIONS                2  AMNIOTIC FLUID                2                                                          SCORE:  8/8 (Note: NST was not performed as part of this antepartum testing) DOPPLER FLOW STUDIES: UMBILICAL ARTERY RI RATIOS:   0.66, 0.63 ANATOMICAL SURVEY                                                                            COMMENTS CEREBRAL VENTRICLES yes normal  CHOROID PLEXUS yes normal  CEREBELLUM    CISTERNA MAGNA    NUCHAL REGION    ORBITS    NASAL BONE    NOSE/LIP    FACIAL PROFILE yes normal  4 CHAMBERED HEART yes normal  OUTFLOW TRACTS    DIAPHRAGM yes normal  STOMACH yes normal  RENAL REGION yes normal  BLADDER yes normal  CORD INSERTION    3 VESSEL CORD yes normal  SPINE    ARMS/HANDS    LEGS/FEET    GENITALIA yes normal female     SUSPECTED ABNORMALITIES:  no QUALITY OF SCAN: satisfactory TECHNICIAN COMMENTS: [place] 34+6 wks,cephalic,fhr 130 bpm,BPP 8/8,RI .66,.63,post pl gr 1,normal ov's bilat,efw 2691 g 57%,afi 10 cm A copy of this report including all images has been saved and backed up to a second source for retrieval if needed. All measures and details of the anatomical scan, placentation, fluid volume and pelvic anatomy are contained in that report. Amber Flora Lipps 02/09/2016 4:16 PM Clinical Impression and recommendations: I have reviewed the sonogram results above, combined with the patient's current clinical course, below are my impressions and any appropriate recommendations for management based on the sonographic findings. 1.  [id number] Estimated Date of Delivery: 03/16/16 by serial sonographic evaluations 2.  Fetal sonographic surveillance findings: a). Normal fluid volume b). Normal antepartum fetal assessment with BPP 8/8 c). Normal fetal Doppler  ratios with consistent  diastolic flow d). Normal growth percentile with appropriate interval growth, 57% 3.  Normal general sonographic findings Recommend continued prenatal evaluations and care based on this sonogram and as clinically indicated from the patient's clinical course. EURE,LUTHER H 02/09/2016 4:39 PM    MAU Course/MDM: I have ordered labs and reviewed results.  NST reviewed Consult Dr Alysia Penna with presentation, exam findings and test results.  Will admit for IOL due to elevations in BPs    Assessment: [facility] @ [redacted]w[redacted]d Chronic hypertension with superimposed preeclampsia Early active labor with change in cervix  Plan: Admit to birthing suites Routine orders Preeclampsia focused order set Plan for IOL/augmentation to follow   Wynelle Bourgeois CNM, MSN Certified Nurse-Midwife 03/06/2016 7:16 PM

## 2016-03-06 NOTE — MAU Note (Signed)
Pt states she has been having ctxs all day. Pt denies LOF or vaginal bleeding. Fetus is active. Pt has been taking BP at home and it is high today. Pt states she has a HA but denies blurry vision or epigastric pain.

## 2016-03-06 NOTE — [facility] Provider Note (Signed)
Chief Complaint:  Hypertension; Headache; and Contractions   First Provider Initiated Contact with Patient 03/06/16 1900     HPI: Marissa Reese is a 35 y.o. G3P2002 at 38w4dwho presents to maternity admissions reporting contractions, elevated blood pressure and headache today.  Has chronic hypertension but usually runs around 140/90.  Takes Labetalol 300mg tid.  Today it has been 150/100.. She reports good fetal movement, denies LOF, vaginal bleeding, vaginal itching/burning, urinary symptoms, dizziness, n/v, diarrhea, constipation or fever/chills.  She denies visual changes or RUQ abdominal pain.  Hypertension  This is a recurrent problem. The current episode started more than 1 month ago. The problem has been gradually worsening since onset. Associated symptoms include headaches. Pertinent negatives include no blurred vision, malaise/fatigue, palpitations, peripheral edema or shortness of breath. There are no associated agents to hypertension. There are no known risk factors for coronary artery disease. Past treatments include beta blockers. There are no compliance problems.   Headache   This is a new problem. The current episode started today. The problem occurs constantly. The problem has been unchanged. The pain is located in the bilateral and frontal region. The pain does not radiate. The quality of the pain is described as aching and dull. Associated symptoms include abdominal pain. Pertinent negatives include no back pain, blurred vision, fever, muscle aches, nausea, photophobia, tingling, visual change, vomiting or weakness. Nothing aggravates the symptoms. She has tried nothing for the symptoms. Her past medical history is significant for hypertension.   RN note: Pt states she has been having ctxs all day. Pt denies LOF or vaginal bleeding. Fetus is active. Pt has been taking BP at home and it is high today. Pt states she has a HA but denies blurry vision or epigastric pain  Past  Medical History: Past Medical History:  Diagnosis Date  . Anxiety   . Depression   . Essential hypertension    Medication since 04/2015  . History of headache   . Hypertension    chronic, was on Lisinopril prior to preg.  . Pregnant 07/17/2015  . Vaginal itching 08/19/2015  . Yeast infection 08/19/2015    Past obstetric history: OB History  Gravida Para Term Preterm AB Living  3 2 2     2  SAB TAB Ectopic Multiple Live Births          2    # Outcome Date GA Lbr Len/2nd Weight Sex Delivery Anes PTL Lv  3 Current           2 Term 12/21/09 [redacted]w[redacted]d  6 lb 11 oz (3.033 kg) F Vag-Spont Pud N LIV  1 Term 08/04/07 [redacted]w[redacted]d  5 lb 5 oz (2.41 kg) F Vag-Spont EPI N LIV      Past Surgical History: Past Surgical History:  Procedure Laterality Date  . CHOLECYSTECTOMY      Family History: Family History  Problem Relation Age of Onset  . Diabetes Father   . Heart attack Father     Age 34  . Hypertension Father   . Hypertension Mother   . Hyperlipidemia Mother   . Heart attack Maternal Grandfather   . Heart attack Paternal Grandmother   . Stroke Paternal Grandmother   . Heart attack Paternal Grandfather     Social History: Social History  Substance Use Topics  . Smoking status: Former Smoker    Packs/day: 0.00    Years: 17.00    Types: Cigarettes    Start date: 07/14/1991    Quit date:   07/13/2015  . Smokeless tobacco: Never Used  . Alcohol use No     Comment: Occasional; not now    Allergies:  Allergies  Allergen Reactions  . Zolpidem Tartrate Other (See Comments)    Reaction:  Hallucinations   . Latex Itching and Rash    Meds:  Prescriptions Prior to Admission  Medication Sig Dispense Refill Last Dose  . acetaminophen (TYLENOL) 500 MG tablet Take 2 tablets (1,000 mg total) by mouth every 6 (six) hours as needed for moderate pain. 30 tablet 0 Taking  . aspirin EC 81 MG tablet Take 81 mg by mouth at bedtime.    Taking  . buPROPion (WELLBUTRIN XL) 300 MG 24 hr tablet Take  300 mg by mouth daily.   Taking  . ferrous sulfate 325 (65 FE) MG tablet Take 325 mg by mouth daily with breakfast.   Taking  . labetalol (NORMODYNE) 200 MG tablet Take 1 tablet (200 mg total) by mouth 3 (three) times daily. 90 tablet 2 Taking  . NIFEdipine (PROCARDIA-XL/ADALAT-CC/NIFEDICAL-XL) 30 MG 24 hr tablet Take 1 tablet (30 mg total) by mouth daily. For 2 days then 1 tablet PO qday prn for mild contractions. 7 tablet 0 Taking  . Prenat-FeFmCb-DSS-FA-DHA w/o A (CITRANATAL HARMONY) 27-1-260 MG CAPS Take 1 capsule by mouth 2 (two) times daily.   Taking    I have reviewed patient's Past Medical Hx, Surgical Hx, Family Hx, Social Hx, medications and allergies.   ROS:  Review of Systems  Constitutional: Negative for fever and malaise/fatigue.  Eyes: Negative for blurred vision and photophobia.  Respiratory: Negative for shortness of breath.   Cardiovascular: Negative for palpitations.  Gastrointestinal: Positive for abdominal pain. Negative for nausea and vomiting.  Musculoskeletal: Negative for back pain.  Neurological: Positive for headaches. Negative for tingling and weakness.   Other systems negative  Physical Exam  Patient Vitals for the past 24 hrs:  BP Temp Temp src Pulse Resp  03/06/16 1851 (!) 155/102 97.5 F (36.4 C) Oral 87 18   Vitals:   03/06/16 1930 03/06/16 1945 03/06/16 2000 03/06/16 2015  BP: 157/88 139/95 137/91 154/95  Pulse: 85 90 86 84  Resp:      Temp:      TempSrc:        Constitutional: Well-developed, well-nourished female in no acute distress.  Cardiovascular: normal rate and rhythm Respiratory: normal effort, clear to auscultation bilaterally GI: Abd soft, non-tender, gravid appropriate for gestational age.   No rebound or guarding. MS: Extremities nontender, no edema, normal ROM Neurologic: Alert and oriented x 4.  DTRs 2+ with no clonus GU: Neg CVAT.  Dilation: 3 Effacement (%): 60 Station: -3 Presentation: Vertex Exam by:: A. Jones  RNC  FHT:  Baseline 140 , moderate variability, accelerations present, no decelerations Contractions:  Irregular     Labs: A/Negative/-- (02/28 1523) Results for orders placed or performed during the hospital encounter of 03/06/16 (from the past 24 hour(s))  Protein / creatinine ratio, urine     Status: Abnormal   Collection Time: 03/06/16  6:50 PM  Result Value Ref Range   Creatinine, Urine 58.00 mg/dL   Total Protein, Urine 15 mg/dL   Protein Creatinine Ratio 0.26 (H) 0.00 - 0.15 mg/mg[Cre]  CBC     Status: Abnormal   Collection Time: 03/06/16  6:55 PM  Result Value Ref Range   WBC 9.4 4.0 - 10.5 K/uL   RBC 4.06 3.87 - 5.11 MIL/uL   Hemoglobin 11.9 (L) 12.0 - 15.0   g/dL   HCT 35.8 (L) 36.0 - 46.0 %   MCV 88.2 78.0 - 100.0 fL   MCH 29.3 26.0 - 34.0 pg   MCHC 33.2 30.0 - 36.0 g/dL   RDW 14.9 11.5 - 15.5 %   Platelets 280 150 - 400 K/uL  Comprehensive metabolic panel     Status: Abnormal   Collection Time: 03/06/16  6:55 PM  Result Value Ref Range   Sodium 137 135 - 145 mmol/L   Potassium 3.4 (L) 3.5 - 5.1 mmol/L   Chloride 108 101 - 111 mmol/L   CO2 20 (L) 22 - 32 mmol/L   Glucose, Bld 83 65 - 99 mg/dL   BUN <5 (L) 6 - 20 mg/dL   Creatinine, Ser 0.53 0.44 - 1.00 mg/dL   Calcium 8.6 (L) 8.9 - 10.3 mg/dL   Total Protein 6.4 (L) 6.5 - 8.1 g/dL   Albumin 3.3 (L) 3.5 - 5.0 g/dL   AST 17 15 - 41 U/L   ALT 16 14 - 54 U/L   Alkaline Phosphatase 122 38 - 126 U/L   Total Bilirubin 0.5 0.3 - 1.2 mg/dL   GFR calc non Af Amer >60 >60 mL/min   GFR calc Af Amer >60 >60 mL/min   Anion gap 9 5 - 15    Imaging:  Us Ob Follow Up  Result Date: 02/26/2016 FOLLOW UP SONOGRAM Suki J Mosco is in the office for a follow up sonogram for EFW,BPP and cord doppler. She is a 35 y.o. year old G3P2002 with Estimated Date of Delivery: 03/16/16 by LMP now at  [redacted]w[redacted]d weeks gestation. Thus far the pregnancy has been complicated by chtn.. GESTATION: SINGLETON PRESENTATION: cephalic FETAL ACTIVITY:           Heart rate         160          The fetus is active. AMNIOTIC FLUID: The amniotic fluid volume is  normal, 13 cm. PLACENTA LOCALIZATION:  posterior GRADE 2 CERVIX: Limited view ADNEXA: The ovaries are normal. GESTATIONAL AGE AND  BIOMETRICS: Gestational criteria: Estimated Date of Delivery: 03/16/16 by LMP now at [redacted]w[redacted]d Previous Scans:8          BIPARIETAL DIAMETER           8.89 cm         36 weeks HEAD CIRCUMFERENCE           33.24 cm         37+6 weeks ABDOMINAL CIRCUMFERENCE           33.45 cm         37+2 weeks FEMUR LENGTH           7.22 cm         37 weeks                                                       AVERAGE EGA(BY THIS SCAN):  37 weeks                                                 ESTIMATED FETAL WEIGHT:       3189  grams, 57 % BIOPHYSCIAL   PROFILE:                                                                                                      COMMENTS GROSS BODY MOVEMENT                 2  TONE                2  RESPIRATIONS                2  AMNIOTIC FLUID                2                                                          SCORE:  8/8 (Note: NST was not performed as part of this antepartum testing) DOPPLER FLOW STUDIES: UMBILICAL ARTERY RI RATIOS:   0.56, 0.63 ANATOMICAL SURVEY                                                                            COMMENTS CEREBRAL VENTRICLES yes normal  CHOROID PLEXUS yes normal  CEREBELLUM    CISTERNA MAGNA    NUCHAL REGION    ORBITS    NASAL BONE yes normal  NOSE/LIP yes normal  FACIAL PROFILE yes normal  4 CHAMBERED HEART    OUTFLOW TRACTS    DIAPHRAGM yes normal  STOMACH yes normal  RENAL REGION yes normal  BLADDER yes normal  CORD INSERTION    3 VESSEL CORD yes normal  SPINE    ARMS/HANDS    LEGS/FEET    GENITALIA yes normal female     SUSPECTED ABNORMALITIES:  no QUALITY OF SCAN: satisfactory TECHNICIAN COMMENTS: US 37+2 wks,cephalic,post pl gr 2,normal ov's bilat,BPP 8/8,FHR 160 bpm, afi 13 cm,EFW 3189 g 57%,RI .56,.63 A copy of this  report including all images has been saved and backed up to a second source for retrieval if needed. All measures and details of the anatomical scan, placentation, fluid volume and pelvic anatomy are contained in that report. Amber J Carl 02/26/2016 10:47 AM Clinical Impression and recommendations: I have reviewed the sonogram results above, combined with the patient's current clinical course, below are my impressions and any appropriate recommendations for management based on the sonographic findings. 1.  G3P2002 Estimated Date of Delivery: 03/16/16 by serial sonographic evaluations 2.  Fetal sonographic surveillance findings: a). Normal fluid volume b). Normal antepartum fetal assessment with BPP 8/8 c). Normal fetal Doppler ratios with consistent diastolic flow d). Normal growth percentile with appropriate interval growth, 57% 3.  Normal general   sonographic findings Recommend continued prenatal evaluations and care based on this sonogram and as clinically indicated from the patient's clinical course. EURE,LUTHER H 02/26/2016 11:05 AM   Us Ob Follow Up  Result Date: 02/09/2016 FOLLOW UP SONOGRAM Anitha J Trulock is in the office for a follow up sonogram for BPP,EFW, and cord doppler. She is a 35 y.o. year old G3P2002 with Estimated Date of Delivery: 03/16/16 by LMP now at  [redacted]w[redacted]d weeks gestation. Thus far the pregnancy has been complicated by CHTN.. GESTATION: SINGLETON PRESENTATION: cephalic FETAL ACTIVITY:          Heart rate         130          The fetus is active. AMNIOTIC FLUID: The amniotic fluid volume is  normal, 10 cm. PLACENTA LOCALIZATION:  posterior GRADE 1 CERVIX: Limited view ADNEXA: The ovaries are normal. GESTATIONAL AGE AND  BIOMETRICS: Gestational criteria: Estimated Date of Delivery: 03/16/16 by LMP now at [redacted]w[redacted]d Previous Scans:6          BIPARIETAL DIAMETER           8.54 cm         34+3 weeks HEAD CIRCUMFERENCE           31.75 cm         35+5 weeks ABDOMINAL CIRCUMFERENCE           31.50 cm          35+3 weeks FEMUR LENGTH           6.82 cm         35 weeks                                                       AVERAGE EGA(BY THIS SCAN):  35+1 weeks                                                 ESTIMATED FETAL WEIGHT:       2691  grams, 57 % BIOPHYSCIAL PROFILE:                                                                                                      COMMENTS GROSS BODY MOVEMENT                 2  TONE                2  RESPIRATIONS                2  AMNIOTIC FLUID                2                                                            SCORE:  8/8 (Note: NST was not performed as part of this antepartum testing) DOPPLER FLOW STUDIES: UMBILICAL ARTERY RI RATIOS:   0.66, 0.63 ANATOMICAL SURVEY                                                                            COMMENTS CEREBRAL VENTRICLES yes normal  CHOROID PLEXUS yes normal  CEREBELLUM    CISTERNA MAGNA    NUCHAL REGION    ORBITS    NASAL BONE    NOSE/LIP    FACIAL PROFILE yes normal  4 CHAMBERED HEART yes normal  OUTFLOW TRACTS    DIAPHRAGM yes normal  STOMACH yes normal  RENAL REGION yes normal  BLADDER yes normal  CORD INSERTION    3 VESSEL CORD yes normal  SPINE    ARMS/HANDS    LEGS/FEET    GENITALIA yes normal female     SUSPECTED ABNORMALITIES:  no QUALITY OF SCAN: satisfactory TECHNICIAN COMMENTS: Us 34+6 wks,cephalic,fhr 130 bpm,BPP 8/8,RI .66,.63,post pl gr 1,normal ov's bilat,efw 2691 g 57%,afi 10 cm A copy of this report including all images has been saved and backed up to a second source for retrieval if needed. All measures and details of the anatomical scan, placentation, fluid volume and pelvic anatomy are contained in that report. Amber J Carl 02/09/2016 4:16 PM Clinical Impression and recommendations: I have reviewed the sonogram results above, combined with the patient's current clinical course, below are my impressions and any appropriate recommendations for management based on the sonographic findings. 1.  G3P2002  Estimated Date of Delivery: 03/16/16 by serial sonographic evaluations 2.  Fetal sonographic surveillance findings: a). Normal fluid volume b). Normal antepartum fetal assessment with BPP 8/8 c). Normal fetal Doppler ratios with consistent diastolic flow d). Normal growth percentile with appropriate interval growth, 57% 3.  Normal general sonographic findings Recommend continued prenatal evaluations and care based on this sonogram and as clinically indicated from the patient's clinical course. EURE,LUTHER H 02/09/2016 4:39 PM   Us Fetal Bpp W/o Non Stress  Result Date: 03/05/2016 FOLLOW UP SONOGRAM Charl J Natal is in the office for a follow up sonogram for BPP and cord doppler. She is a 35 y.o. year old G3P2002 with Estimated Date of Delivery: 03/16/16 by LMP now at  [redacted]w[redacted]d weeks gestation. Thus far the pregnancy has been complicated by CHTN. GESTATION: SINGLETON PRESENTATION: cephalic FETAL ACTIVITY:          Heart rate         127 BPM          The fetus is active. AMNIOTIC FLUID: The amniotic fluid volume is  normal, 11.2 cm. PLACENTA LOCALIZATION:  posterior GRADE 3 CERVIX: Limited view ADNEXA: The ovaries are normal. GESTATIONAL AGE AND  BIOMETRICS: Gestational criteria: Estimated Date of Delivery: 03/16/16 by LMP now at [redacted]w[redacted]d Previous Scans:9 BIOPHYSCIAL PROFILE:                                                                                                        COMMENTS GROSS BODY MOVEMENT                 2  TONE                2  RESPIRATIONS                2  AMNIOTIC FLUID                2                                                          SCORE:  8/8 (Note: NST was not performed as part of this antepartum testing) DOPPLER FLOW STUDIES: UMBILICAL ARTERY RI RATIOS:   0.55, 0.57 ANATOMICAL SURVEY                                                                            COMMENTS CEREBRAL VENTRICLES    CHOROID PLEXUS    CEREBELLUM    CISTERNA MAGNA    NUCHAL REGION    ORBITS    NASAL BONE yes  normal  NOSE/LIP yes normal  FACIAL PROFILE yes normal  4 CHAMBERED HEART yes normal  OUTFLOW TRACTS yes normal  DIAPHRAGM yes normal  STOMACH yes normal  RENAL REGION yes normal  BLADDER yes normal  CORD INSERTION    3 VESSEL CORD yes normal  SPINE    ARMS/HANDS    LEGS/FEET    GENITALIA   female     SUSPECTED ABNORMALITIES:  no QUALITY OF SCAN: satisfactory TECHNICIAN COMMENTS: US 38+3 wks,cephalic,post pl gr 3,afi 11.2cm,fhr 127 bpm,RI .57,.55,normal ov's bilat,BPP 8/8 A copy of this report including all images has been saved and backed up to a second source for retrieval if needed. All measures and details of the anatomical scan, placentation, fluid volume and pelvic anatomy are contained in that report. Amber J Carl 03/05/2016 12:49 PM Clinical Impression and recommendations: I have reviewed the sonogram results above, combined with the patient's current clinical course, below are my impressions and any appropriate recommendations for management based on the sonographic findings. 1.  G3P2002 Estimated Date of Delivery: 03/16/16 by serial sonographic evaluations 2.  Fetal sonographic surveillance findings: a). Normal fluid volume b). Normal antepartum fetal assessment with BPP 8/8 c). Normal fetal Doppler ratios with consistent diastolic flow 3.  Normal general sonographic findings Recommend continued prenatal evaluations and care based on this sonogram and as clinically indicated from the patient's clinical course. EURE,LUTHER H 03/05/2016 12:54 PM   Us Fetal Bpp W/o Non Stress  Result Date: 02/26/2016 FOLLOW UP SONOGRAM Ardythe J Baze is in the office for a follow up sonogram for EFW,BPP and cord doppler. She is a 35 y.o. year old G3P2002 with Estimated Date of Delivery: 03/16/16 by LMP now at  [redacted]w[redacted]d weeks gestation. Thus far the pregnancy has been complicated by chtn.. GESTATION: SINGLETON PRESENTATION: cephalic FETAL ACTIVITY:          Heart rate         160            The fetus is active. AMNIOTIC FLUID:  The amniotic fluid volume is  normal, 13 cm. PLACENTA LOCALIZATION:  posterior GRADE 2 CERVIX: Limited view ADNEXA: The ovaries are normal. GESTATIONAL AGE AND  BIOMETRICS: Gestational criteria: Estimated Date of Delivery: 03/16/16 by LMP now at [redacted]w[redacted]d Previous Scans:8          BIPARIETAL DIAMETER           8.89 cm         36 weeks HEAD CIRCUMFERENCE           33.24 cm         37+6 weeks ABDOMINAL CIRCUMFERENCE           33.45 cm         37+2 weeks FEMUR LENGTH           7.22 cm         37 weeks                                                       AVERAGE EGA(BY THIS SCAN):  37 weeks                                                 ESTIMATED FETAL WEIGHT:       3189  grams, 57 % BIOPHYSCIAL PROFILE:                                                                                                      COMMENTS GROSS BODY MOVEMENT                 2  TONE                2  RESPIRATIONS                2  AMNIOTIC FLUID                2                                                          SCORE:  8/8 (Note: NST was not performed as part of this antepartum testing) DOPPLER FLOW STUDIES: UMBILICAL ARTERY RI RATIOS:   0.56, 0.63 ANATOMICAL SURVEY                                                                              COMMENTS CEREBRAL VENTRICLES yes normal  CHOROID PLEXUS yes normal  CEREBELLUM    CISTERNA MAGNA    NUCHAL REGION    ORBITS    NASAL BONE yes normal  NOSE/LIP yes normal  FACIAL PROFILE yes normal  4 CHAMBERED HEART    OUTFLOW TRACTS    DIAPHRAGM yes normal  STOMACH yes normal  RENAL REGION yes normal  BLADDER yes normal  CORD INSERTION    3 VESSEL CORD yes normal  SPINE    ARMS/HANDS    LEGS/FEET    GENITALIA yes normal female     SUSPECTED ABNORMALITIES:  no QUALITY OF SCAN: satisfactory TECHNICIAN COMMENTS: US 37+2 wks,cephalic,post pl gr 2,normal ov's bilat,BPP 8/8,FHR 160 bpm, afi 13 cm,EFW 3189 g 57%,RI .56,.63 A copy of this report including all images has been saved and backed up to a second source for  retrieval if needed. All measures and details of the anatomical scan, placentation, fluid volume and pelvic anatomy are contained in that report. Amber J Carl 02/26/2016 10:47 AM Clinical Impression and recommendations: I have reviewed the sonogram results above, combined with the patient's current clinical course, below are my impressions and any appropriate recommendations for management based on the sonographic findings. 1.  G3P2002 Estimated Date of Delivery: 03/16/16 by serial sonographic evaluations 2.  Fetal sonographic surveillance findings: a). Normal fluid volume b). Normal antepartum fetal assessment with BPP 8/8 c). Normal fetal Doppler ratios with consistent diastolic flow d). Normal growth percentile with appropriate interval growth, 57% 3.  Normal general sonographic findings Recommend continued prenatal evaluations and care based on this sonogram and as clinically indicated from the patient's clinical course. EURE,LUTHER H 02/26/2016 11:05 AM   Us Fetal Bpp W/o Non Stress  Result Date: 02/20/2016 FOLLOW UP SONOGRAM Junko J Schouten is in the office for a follow up sonogram for BPP and cord doppler. She is a 35 y.o. year old G3P2002 with Estimated Date of Delivery: 03/16/16 by LMP now at  [redacted]w[redacted]d weeks gestation. Thus far the pregnancy has been complicated by CHTN. GESTATION: SINGLETON PRESENTATION: cephalic FETAL ACTIVITY:          Heart rate         137          The fetus is active. AMNIOTIC FLUID: The amniotic fluid volume is  normal, 11 cm. PLACENTA LOCALIZATION:  posterior GRADE 2 CERVIX: Limited view ADNEXA: The ovaries are normal. GESTATIONAL AGE AND  BIOMETRICS: Gestational criteria: Estimated Date of Delivery: 03/16/16 by LMP now at [redacted]w[redacted]d Previous Scans:7. BIOPHYSCIAL PROFILE:                                                                                                      COMMENTS GROSS BODY MOVEMENT                 2  TONE                2  RESPIRATIONS                2  AMNIOTIC FLUID                   2                                                          SCORE:  8/8 (Note: NST was not performed as part of this antepartum testing) DOPPLER FLOW STUDIES: UMBILICAL ARTERY RI RATIOS:   0.60, 0.61 ANATOMICAL SURVEY                                                                            COMMENTS CEREBRAL VENTRICLES yes normal  CHOROID PLEXUS yes normal  CEREBELLUM    CISTERNA MAGNA    NUCHAL REGION    ORBITS    NASAL BONE    NOSE/LIP yes normal  FACIAL PROFILE yes normal  4 CHAMBERED HEART yes normal  OUTFLOW TRACTS yes normal  DIAPHRAGM yes normal  STOMACH yes normal  RENAL REGION yes normal  BLADDER yes normal  CORD INSERTION    3 VESSEL CORD yes normal  SPINE    ARMS/HANDS    LEGS/FEET    GENITALIA yes normal female     SUSPECTED ABNORMALITIES:  no QUALITY OF SCAN: satisfactory TECHNICIAN COMMENTS: US 36+3 wks,cephalic,BPP 8/8,fhr 137 bpm,normal ov's bilat,RI .60,.61,AFI 11 cm,post pl gr 2 A copy of this report including all images has been saved and backed up to a second source for retrieval if needed. All measures and details of the anatomical scan, placentation, fluid volume and pelvic anatomy are contained in that report. Amber J Carl 02/20/2016 9:44 AM Clinical Impression and recommendations: I have reviewed the sonogram results above, combined with the patient's current clinical course, below are my impressions and any appropriate recommendations for management based on the sonographic findings. 1.  G3P2002 Estimated Date of Delivery: 03/16/16 by serial sonographic evaluations 2.  Fetal sonographic surveillance findings: a). Normal fluid volume b). Normal antepartum fetal assessment with BPP 8/8 c). Normal fetal Doppler ratios with consistent diastolic flow 3.  Normal general sonographic findings Recommend continued prenatal evaluations and care based on this sonogram and as clinically indicated from the patient's clinical course. EURE,LUTHER H 02/20/2016 10:01 AM   Us Fetal Bpp W/o Non  Stress  Result Date: 02/09/2016 FOLLOW UP SONOGRAM Isis J Yanko is in the office for a follow up sonogram for BPP,EFW, and cord doppler. She is a 35 y.o. year old G3P2002 with Estimated Date of Delivery: 03/16/16 by LMP now at  [redacted]w[redacted]d weeks gestation. Thus far the pregnancy has been complicated by CHTN.. GESTATION: SINGLETON PRESENTATION: cephalic FETAL ACTIVITY:          Heart rate         130          The fetus is active. AMNIOTIC FLUID: The amniotic fluid volume is  normal, 10 cm. PLACENTA LOCALIZATION:  posterior GRADE 1 CERVIX: Limited view ADNEXA: The ovaries are normal. GESTATIONAL AGE AND  BIOMETRICS: Gestational criteria: Estimated Date of Delivery: 03/16/16 by LMP now at [redacted]w[redacted]d Previous Scans:6          BIPARIETAL DIAMETER             8.54 cm         34+3 weeks HEAD CIRCUMFERENCE           31.75 cm         35+5 weeks ABDOMINAL CIRCUMFERENCE           31.50 cm         35+3 weeks FEMUR LENGTH           6.82 cm         35 weeks                                                       AVERAGE EGA(BY THIS SCAN):  35+1 weeks                                                 ESTIMATED FETAL WEIGHT:       2691  grams, 57 % BIOPHYSCIAL PROFILE:                                                                                                      COMMENTS GROSS BODY MOVEMENT                 2  TONE                2  RESPIRATIONS                2  AMNIOTIC FLUID                2                                                          SCORE:  8/8 (Note: NST was not performed as part of this antepartum testing) DOPPLER FLOW STUDIES: UMBILICAL ARTERY RI RATIOS:   0.66, 0.63 ANATOMICAL SURVEY                                                                            COMMENTS CEREBRAL VENTRICLES yes normal  CHOROID PLEXUS yes normal  CEREBELLUM    CISTERNA MAGNA    NUCHAL REGION    ORBITS    NASAL BONE    NOSE/LIP    FACIAL PROFILE yes normal  4 CHAMBERED HEART yes normal  OUTFLOW TRACTS    DIAPHRAGM   yes normal  STOMACH yes  normal  RENAL REGION yes normal  BLADDER yes normal  CORD INSERTION    3 VESSEL CORD yes normal  SPINE    ARMS/HANDS    LEGS/FEET    GENITALIA yes normal female     SUSPECTED ABNORMALITIES:  no QUALITY OF SCAN: satisfactory TECHNICIAN COMMENTS: Us 34+6 wks,cephalic,fhr 130 bpm,BPP 8/8,RI .66,.63,post pl gr 1,normal ov's bilat,efw 2691 g 57%,afi 10 cm A copy of this report including all images has been saved and backed up to a second source for retrieval if needed. All measures and details of the anatomical scan, placentation, fluid volume and pelvic anatomy are contained in that report. Amber J Carl 02/09/2016 4:16 PM Clinical Impression and recommendations: I have reviewed the sonogram results above, combined with the patient's current clinical course, below are my impressions and any appropriate recommendations for management based on the sonographic findings. 1.  G3P2002 Estimated Date of Delivery: 03/16/16 by serial sonographic evaluations 2.  Fetal sonographic surveillance findings: a). Normal fluid volume b). Normal antepartum fetal assessment with BPP 8/8 c). Normal fetal Doppler ratios with consistent diastolic flow d). Normal growth percentile with appropriate interval growth, 57% 3.  Normal general sonographic findings Recommend continued prenatal evaluations and care based on this sonogram and as clinically indicated from the patient's clinical course. EURE,LUTHER H 02/09/2016 4:39 PM   Us Ua Cord Doppler  Result Date: 03/05/2016 FOLLOW UP SONOGRAM Judene J Blinn is in the office for a follow up sonogram for BPP and cord doppler. She is a 35 y.o. year old G3P2002 with Estimated Date of Delivery: 03/16/16 by LMP now at  [redacted]w[redacted]d weeks gestation. Thus far the pregnancy has been complicated by CHTN. GESTATION: SINGLETON PRESENTATION: cephalic FETAL ACTIVITY:          Heart rate         127 BPM          The fetus is active. AMNIOTIC FLUID: The amniotic fluid volume is  normal, 11.2 cm. PLACENTA LOCALIZATION:   posterior GRADE 3 CERVIX: Limited view ADNEXA: The ovaries are normal. GESTATIONAL AGE AND  BIOMETRICS: Gestational criteria: Estimated Date of Delivery: 03/16/16 by LMP now at [redacted]w[redacted]d Previous Scans:9 BIOPHYSCIAL PROFILE:                                                                                                      COMMENTS GROSS BODY MOVEMENT                 2  TONE                2  RESPIRATIONS                2  AMNIOTIC FLUID                2                                                            SCORE:  8/8 (Note: NST was not performed as part of this antepartum testing) DOPPLER FLOW STUDIES: UMBILICAL ARTERY RI RATIOS:   0.55, 0.57 ANATOMICAL SURVEY                                                                            COMMENTS CEREBRAL VENTRICLES    CHOROID PLEXUS    CEREBELLUM    CISTERNA MAGNA    NUCHAL REGION    ORBITS    NASAL BONE yes normal  NOSE/LIP yes normal  FACIAL PROFILE yes normal  4 CHAMBERED HEART yes normal  OUTFLOW TRACTS yes normal  DIAPHRAGM yes normal  STOMACH yes normal  RENAL REGION yes normal  BLADDER yes normal  CORD INSERTION    3 VESSEL CORD yes normal  SPINE    ARMS/HANDS    LEGS/FEET    GENITALIA   female     SUSPECTED ABNORMALITIES:  no QUALITY OF SCAN: satisfactory TECHNICIAN COMMENTS: US 38+3 wks,cephalic,post pl gr 3,afi 11.2cm,fhr 127 bpm,RI .57,.55,normal ov's bilat,BPP 8/8 A copy of this report including all images has been saved and backed up to a second source for retrieval if needed. All measures and details of the anatomical scan, placentation, fluid volume and pelvic anatomy are contained in that report. Amber J Carl 03/05/2016 12:49 PM Clinical Impression and recommendations: I have reviewed the sonogram results above, combined with the patient's current clinical course, below are my impressions and any appropriate recommendations for management based on the sonographic findings. 1.  G3P2002 Estimated Date of Delivery: 03/16/16 by serial sonographic evaluations  2.  Fetal sonographic surveillance findings: a). Normal fluid volume b). Normal antepartum fetal assessment with BPP 8/8 c). Normal fetal Doppler ratios with consistent diastolic flow 3.  Normal general sonographic findings Recommend continued prenatal evaluations and care based on this sonogram and as clinically indicated from the patient's clinical course. EURE,LUTHER H 03/05/2016 12:54 PM   Us Ua Cord Doppler  Result Date: 02/26/2016 FOLLOW UP SONOGRAM Lyllian J Shareef is in the office for a follow up sonogram for EFW,BPP and cord doppler. She is a 35 y.o. year old G3P2002 with Estimated Date of Delivery: 03/16/16 by LMP now at  [redacted]w[redacted]d weeks gestation. Thus far the pregnancy has been complicated by chtn.. GESTATION: SINGLETON PRESENTATION: cephalic FETAL ACTIVITY:          Heart rate         160          The fetus is active. AMNIOTIC FLUID: The amniotic fluid volume is  normal, 13 cm. PLACENTA LOCALIZATION:  posterior GRADE 2 CERVIX: Limited view ADNEXA: The ovaries are normal. GESTATIONAL AGE AND  BIOMETRICS: Gestational criteria: Estimated Date of Delivery: 03/16/16 by LMP now at [redacted]w[redacted]d Previous Scans:8          BIPARIETAL DIAMETER           8.89 cm         36 weeks HEAD CIRCUMFERENCE           33.24 cm         37+6 weeks ABDOMINAL CIRCUMFERENCE           33.45 cm           37+2 weeks FEMUR LENGTH           7.22 cm         37 weeks                                                       AVERAGE EGA(BY THIS SCAN):  37 weeks                                                 ESTIMATED FETAL WEIGHT:       3189  grams, 57 % BIOPHYSCIAL PROFILE:                                                                                                      COMMENTS GROSS BODY MOVEMENT                 2  TONE                2  RESPIRATIONS                2  AMNIOTIC FLUID                2                                                          SCORE:  8/8 (Note: NST was not performed as part of this antepartum testing) DOPPLER FLOW  STUDIES: UMBILICAL ARTERY RI RATIOS:   0.56, 0.63 ANATOMICAL SURVEY                                                                            COMMENTS CEREBRAL VENTRICLES yes normal  CHOROID PLEXUS yes normal  CEREBELLUM    CISTERNA MAGNA    NUCHAL REGION    ORBITS    NASAL BONE yes normal  NOSE/LIP yes normal  FACIAL PROFILE yes normal  4 CHAMBERED HEART    OUTFLOW TRACTS    DIAPHRAGM yes normal  STOMACH yes normal  RENAL REGION yes normal  BLADDER yes normal  CORD INSERTION    3 VESSEL CORD yes normal  SPINE    ARMS/HANDS    LEGS/FEET    GENITALIA yes normal female     SUSPECTED ABNORMALITIES:  no QUALITY OF SCAN: satisfactory TECHNICIAN COMMENTS:   US 37+2 wks,cephalic,post pl gr 2,normal ov's bilat,BPP 8/8,FHR 160 bpm, afi 13 cm,EFW 3189 g 57%,RI .56,.63 A copy of this report including all images has been saved and backed up to a second source for retrieval if needed. All measures and details of the anatomical scan, placentation, fluid volume and pelvic anatomy are contained in that report. Amber J Carl 02/26/2016 10:47 AM Clinical Impression and recommendations: I have reviewed the sonogram results above, combined with the patient's current clinical course, below are my impressions and any appropriate recommendations for management based on the sonographic findings. 1.  G3P2002 Estimated Date of Delivery: 03/16/16 by serial sonographic evaluations 2.  Fetal sonographic surveillance findings: a). Normal fluid volume b). Normal antepartum fetal assessment with BPP 8/8 c). Normal fetal Doppler ratios with consistent diastolic flow d). Normal growth percentile with appropriate interval growth, 57% 3.  Normal general sonographic findings Recommend continued prenatal evaluations and care based on this sonogram and as clinically indicated from the patient's clinical course. EURE,LUTHER H 02/26/2016 11:05 AM   Us Ua Cord Doppler  Result Date: 02/20/2016 FOLLOW UP SONOGRAM Hazleigh J Weesner is in the office for a follow  up sonogram for BPP and cord doppler. She is a 35 y.o. year old G3P2002 with Estimated Date of Delivery: 03/16/16 by LMP now at  [redacted]w[redacted]d weeks gestation. Thus far the pregnancy has been complicated by CHTN. GESTATION: SINGLETON PRESENTATION: cephalic FETAL ACTIVITY:          Heart rate         137          The fetus is active. AMNIOTIC FLUID: The amniotic fluid volume is  normal, 11 cm. PLACENTA LOCALIZATION:  posterior GRADE 2 CERVIX: Limited view ADNEXA: The ovaries are normal. GESTATIONAL AGE AND  BIOMETRICS: Gestational criteria: Estimated Date of Delivery: 03/16/16 by LMP now at [redacted]w[redacted]d Previous Scans:7. BIOPHYSCIAL PROFILE:                                                                                                      COMMENTS GROSS BODY MOVEMENT                 2  TONE                2  RESPIRATIONS                2  AMNIOTIC FLUID                2                                                          SCORE:  8/8 (Note: NST was not performed as part of this antepartum testing) DOPPLER FLOW STUDIES: UMBILICAL ARTERY RI RATIOS:   0.60, 0.61 ANATOMICAL SURVEY                                                                              COMMENTS CEREBRAL VENTRICLES yes normal  CHOROID PLEXUS yes normal  CEREBELLUM    CISTERNA MAGNA    NUCHAL REGION    ORBITS    NASAL BONE    NOSE/LIP yes normal  FACIAL PROFILE yes normal  4 CHAMBERED HEART yes normal  OUTFLOW TRACTS yes normal  DIAPHRAGM yes normal  STOMACH yes normal  RENAL REGION yes normal  BLADDER yes normal  CORD INSERTION    3 VESSEL CORD yes normal  SPINE    ARMS/HANDS    LEGS/FEET    GENITALIA yes normal female     SUSPECTED ABNORMALITIES:  no QUALITY OF SCAN: satisfactory TECHNICIAN COMMENTS: US 36+3 wks,cephalic,BPP 8/8,fhr 137 bpm,normal ov's bilat,RI .60,.61,AFI 11 cm,post pl gr 2 A copy of this report including all images has been saved and backed up to a second source for retrieval if needed. All measures and details of the anatomical scan,  placentation, fluid volume and pelvic anatomy are contained in that report. Amber J Carl 02/20/2016 9:44 AM Clinical Impression and recommendations: I have reviewed the sonogram results above, combined with the patient's current clinical course, below are my impressions and any appropriate recommendations for management based on the sonographic findings. 1.  G3P2002 Estimated Date of Delivery: 03/16/16 by serial sonographic evaluations 2.  Fetal sonographic surveillance findings: a). Normal fluid volume b). Normal antepartum fetal assessment with BPP 8/8 c). Normal fetal Doppler ratios with consistent diastolic flow 3.  Normal general sonographic findings Recommend continued prenatal evaluations and care based on this sonogram and as clinically indicated from the patient's clinical course. EURE,LUTHER H 02/20/2016 10:01 AM   Us Ua Cord Doppler  Result Date: 02/09/2016 FOLLOW UP SONOGRAM Haniyyah J Rash is in the office for a follow up sonogram for BPP,EFW, and cord doppler. She is a 35 y.o. year old G3P2002 with Estimated Date of Delivery: 03/16/16 by LMP now at  [redacted]w[redacted]d weeks gestation. Thus far the pregnancy has been complicated by CHTN.. GESTATION: SINGLETON PRESENTATION: cephalic FETAL ACTIVITY:          Heart rate         130          The fetus is active. AMNIOTIC FLUID: The amniotic fluid volume is  normal, 10 cm. PLACENTA LOCALIZATION:  posterior GRADE 1 CERVIX: Limited view ADNEXA: The ovaries are normal. GESTATIONAL AGE AND  BIOMETRICS: Gestational criteria: Estimated Date of Delivery: 03/16/16 by LMP now at [redacted]w[redacted]d Previous Scans:6          BIPARIETAL DIAMETER           8.54 cm         34+3 weeks HEAD CIRCUMFERENCE           31.75 cm         35+5 weeks ABDOMINAL CIRCUMFERENCE           31.50 cm         35+3 weeks FEMUR LENGTH           6.82 cm         35 weeks                                                       AVERAGE EGA(BY THIS SCAN):  35+1 weeks                                                   ESTIMATED  FETAL WEIGHT:       2691  grams, 57 % BIOPHYSCIAL PROFILE:                                                                                                      COMMENTS GROSS BODY MOVEMENT                 2  TONE                2  RESPIRATIONS                2  AMNIOTIC FLUID                2                                                          SCORE:  8/8 (Note: NST was not performed as part of this antepartum testing) DOPPLER FLOW STUDIES: UMBILICAL ARTERY RI RATIOS:   0.66, 0.63 ANATOMICAL SURVEY                                                                            COMMENTS CEREBRAL VENTRICLES yes normal  CHOROID PLEXUS yes normal  CEREBELLUM    CISTERNA MAGNA    NUCHAL REGION    ORBITS    NASAL BONE    NOSE/LIP    FACIAL PROFILE yes normal  4 CHAMBERED HEART yes normal  OUTFLOW TRACTS    DIAPHRAGM yes normal  STOMACH yes normal  RENAL REGION yes normal  BLADDER yes normal  CORD INSERTION    3 VESSEL CORD yes normal  SPINE    ARMS/HANDS    LEGS/FEET    GENITALIA yes normal female     SUSPECTED ABNORMALITIES:  no QUALITY OF SCAN: satisfactory TECHNICIAN COMMENTS: Us 34+6 wks,cephalic,fhr 130 bpm,BPP 8/8,RI .66,.63,post pl gr 1,normal ov's bilat,efw 2691 g 57%,afi 10 cm A copy of this report including all images has been saved and backed up to a second source for retrieval if needed. All measures and details of the anatomical scan, placentation, fluid volume and pelvic anatomy are contained in that report. Amber J Carl 02/09/2016 4:16 PM Clinical Impression and recommendations: I have reviewed the sonogram results above, combined with the patient's current clinical course, below are my impressions and any appropriate recommendations for management based on the sonographic findings. 1.  G3P2002 Estimated Date of Delivery: 03/16/16 by serial sonographic evaluations 2.  Fetal sonographic surveillance findings: a). Normal fluid volume b). Normal antepartum fetal assessment with BPP 8/8 c). Normal fetal Doppler  ratios with consistent   diastolic flow d). Normal growth percentile with appropriate interval growth, 57% 3.  Normal general sonographic findings Recommend continued prenatal evaluations and care based on this sonogram and as clinically indicated from the patient's clinical course. EURE,LUTHER H 02/09/2016 4:39 PM    MAU Course/MDM: I have ordered labs and reviewed results.  NST reviewed Consult Dr Ervin with presentation, exam findings and test results.  Will admit for IOL due to elevations in BPs    Assessment: SIUP @ [redacted]w[redacted]d Chronic hypertension with superimposed preeclampsia Early active labor with change in cervix  Plan: Admit to birthing suites Routine orders Preeclampsia focused order set Plan for IOL/augmentation to follow   Falana Clagg CNM, MSN Certified Nurse-Midwife 03/06/2016 7:16 PM   

## 2016-03-07 ENCOUNTER — Inpatient Hospital Stay (HOSPITAL_COMMUNITY): Payer: Medicaid Other | Admitting: Anesthesiology

## 2016-03-07 ENCOUNTER — Encounter (HOSPITAL_COMMUNITY): Payer: Self-pay | Admitting: *Deleted

## 2016-03-07 DIAGNOSIS — Z87891 Personal history of nicotine dependence: Secondary | ICD-10-CM

## 2016-03-07 DIAGNOSIS — O114 Pre-existing hypertension with pre-eclampsia, complicating childbirth: Secondary | ICD-10-CM

## 2016-03-07 DIAGNOSIS — O1002 Pre-existing essential hypertension complicating childbirth: Secondary | ICD-10-CM

## 2016-03-07 DIAGNOSIS — O99824 Streptococcus B carrier state complicating childbirth: Secondary | ICD-10-CM

## 2016-03-07 DIAGNOSIS — Z3A38 38 weeks gestation of pregnancy: Secondary | ICD-10-CM

## 2016-03-07 LAB — RPR: RPR: NONREACTIVE

## 2016-03-07 MED ORDER — SODIUM CHLORIDE 0.9% FLUSH: 3.0000 mL | Freq: Two times a day (BID) | INTRAVENOUS | Status: DC

## 2016-03-07 MED ORDER — LISINOPRIL 5 MG PO TABS
7.5000 mg | ORAL_TABLET | Freq: Every day | ORAL | Status: DC
  Filled 2016-03-07: qty 1

## 2016-03-07 MED ORDER — OXYTOCIN 40 UNITS IN LACTATED RINGERS INFUSION - SIMPLE MED: 2.5000 [IU]/h | INTRAVENOUS | Status: DC | PRN

## 2016-03-07 MED ORDER — PRENATAL MULTIVITAMIN CH
1.0000 | ORAL_TABLET | Freq: Every day | ORAL | Status: DC
  Administered 2016-03-08: 1 via ORAL
  Filled 2016-03-07: qty 1

## 2016-03-07 MED ORDER — ONDANSETRON HCL 4 MG PO TABS: 4.0000 mg | ORAL_TABLET | ORAL | Status: DC | PRN

## 2016-03-07 MED ORDER — BENZOCAINE-MENTHOL 20-0.5 % EX AERO
1.0000 | INHALATION_SPRAY | CUTANEOUS | Status: DC | PRN
Start: 2016-03-07 — End: 2016-03-08

## 2016-03-07 MED ORDER — ACETAMINOPHEN 325 MG PO TABS: 650.0000 mg | ORAL_TABLET | ORAL | Status: DC | PRN

## 2016-03-07 MED ORDER — AMLODIPINE BESYLATE 5 MG PO TABS
5.0000 mg | ORAL_TABLET | Freq: Every day | ORAL | Status: DC
  Administered 2016-03-07 – 2016-03-08 (×2): 5 mg via ORAL
  Filled 2016-03-07 (×3): qty 1

## 2016-03-07 MED ORDER — SIMETHICONE 80 MG PO CHEW: 80.0000 mg | CHEWABLE_TABLET | ORAL | Status: DC | PRN

## 2016-03-07 MED ORDER — SODIUM CHLORIDE 0.9 % IV SOLN: 250.0000 mL | INTRAVENOUS | Status: DC | PRN

## 2016-03-07 MED ORDER — COCONUT OIL OIL: 1.0000 "application " | TOPICAL_OIL | Status: DC | PRN

## 2016-03-07 MED ORDER — DIBUCAINE 1 % RE OINT: 1.0000 "application " | TOPICAL_OINTMENT | RECTAL | Status: DC | PRN

## 2016-03-07 MED ORDER — OXYCODONE HCL 5 MG PO TABS: 10.0000 mg | ORAL_TABLET | ORAL | Status: DC | PRN

## 2016-03-07 MED ORDER — DIPHENHYDRAMINE HCL 25 MG PO CAPS
25.0000 mg | ORAL_CAPSULE | Freq: Four times a day (QID) | ORAL | Status: DC | PRN
Start: 2016-03-07 — End: 2016-03-08

## 2016-03-07 MED ORDER — MISOPROSTOL 200 MCG PO TABS
ORAL_TABLET | ORAL | Status: AC
  Administered 2016-03-07: 1000 ug via RECTAL
  Filled 2016-03-07: qty 5

## 2016-03-07 MED ORDER — SODIUM CHLORIDE 0.9% FLUSH: 3.0000 mL | INTRAVENOUS | Status: DC | PRN

## 2016-03-07 MED ORDER — WITCH HAZEL-GLYCERIN EX PADS: 1.0000 "application " | MEDICATED_PAD | CUTANEOUS | Status: DC | PRN

## 2016-03-07 MED ORDER — LIDOCAINE HCL (PF) 1 % IJ SOLN
INTRAMUSCULAR | Status: DC | PRN
  Administered 2016-03-07 (×2): 4 mL

## 2016-03-07 MED ORDER — OXYCODONE HCL 5 MG PO TABS: 5.0000 mg | ORAL_TABLET | ORAL | Status: DC | PRN

## 2016-03-07 MED ORDER — SENNOSIDES-DOCUSATE SODIUM 8.6-50 MG PO TABS
2.0000 | ORAL_TABLET | ORAL | Status: DC
  Administered 2016-03-08: 2 via ORAL
  Filled 2016-03-07: qty 2

## 2016-03-07 MED ORDER — ONDANSETRON HCL 4 MG/2ML IJ SOLN: 4.0000 mg | INTRAMUSCULAR | Status: DC | PRN

## 2016-03-07 MED ORDER — BUPROPION HCL ER (XL) 300 MG PO TB24
300.0000 mg | ORAL_TABLET | Freq: Every day | ORAL | Status: DC
  Administered 2016-03-07 – 2016-03-08 (×2): 300 mg via ORAL
  Filled 2016-03-07 (×3): qty 1

## 2016-03-07 MED ORDER — TETANUS-DIPHTH-ACELL PERTUSSIS 5-2.5-18.5 LF-MCG/0.5 IM SUSP
0.5000 mL | Freq: Once | INTRAMUSCULAR | Status: AC
  Administered 2016-03-08: 0.5 mL via INTRAMUSCULAR
  Filled 2016-03-07: qty 0.5

## 2016-03-07 MED ORDER — IBUPROFEN 600 MG PO TABS
600.0000 mg | ORAL_TABLET | Freq: Four times a day (QID) | ORAL | Status: DC
  Administered 2016-03-07 – 2016-03-08 (×5): 600 mg via ORAL
  Filled 2016-03-07 (×5): qty 1

## 2016-03-07 NOTE — Anesthesia Preprocedure Evaluation (Signed)
Anesthesia Evaluation  Patient identified by MRN, date of birth, ID band Patient awake    Reviewed: Allergy & Precautions, NPO status , Patient's Chart, lab work & pertinent test results  History of Anesthesia Complications Negative for: history of anesthetic complications  Airway Mallampati: II  TM Distance: >3 FB Neck ROM: Full    Dental no notable dental hx. (+) Dental Advisory Given   Pulmonary neg pulmonary ROS, former smoker,    Pulmonary exam normal breath sounds clear to auscultation       Cardiovascular hypertension, Pt. on home beta blockers and Pt. on medications Normal cardiovascular exam Rhythm:Regular Rate:Normal     Neuro/Psych negative neurological ROS  negative psych ROS   GI/Hepatic negative GI ROS, Neg liver ROS,   Endo/Other  obesity  Renal/GU negative Renal ROS  negative genitourinary   Musculoskeletal negative musculoskeletal ROS (+)   Abdominal   Peds negative pediatric ROS (+)  Hematology negative hematology ROS (+)   Anesthesia Other Findings   Reproductive/Obstetrics (+) Pregnancy                             Anesthesia Physical Anesthesia Plan  ASA: II  Anesthesia Plan: Epidural   Post-op Pain Management:    Induction:   Airway Management Planned:   Additional Equipment:   Intra-op Plan:   Post-operative Plan:   Informed Consent: I have reviewed the patients History and Physical, chart, labs and discussed the procedure including the risks, benefits and alternatives for the proposed anesthesia with the patient or authorized representative who has indicated his/her understanding and acceptance.   Dental advisory given  Plan Discussed with: CRNA  Anesthesia Plan Comments:         Anesthesia Quick Evaluation

## 2016-03-07 NOTE — Progress Notes (Signed)
Patient ID: Marissa Reese, female   DOB: 04/30/1981, 35 y.o.   MRN: [id number]  S: Patient seen & examined for progress of IOL for cHTN with SIPE without severe features. Patient comfortable in bed with epidural in place, sleeping. Denies HAs, changes in vision, RUQ/epigastric.   O:  Vitals:   03/07/16 0131 03/07/16 0201 03/07/16 0231 03/07/16 0301  BP: (!) 147/95 (!) 149/98 (!) 154/86 (!) 148/97  Pulse: 91 86 83 83  Resp: 18 18 18 18   Temp: 98.9 F (37.2 C)     TempSrc: Oral     SpO2:      Weight:      Height:        Dilation: 4.5 Effacement (%): 60 Station: -3 Presentation: Vertex Exam by:: Cletis MediaK. Anderson, RN Cervix is posterior with moderate consistency Pitocin is at 12  A/P: Marissa JettyJennifer J Townshend is a 35 y.o. G3P2002 at 6853w5d [facility] without severe features.   BPs stable without any BPs in severe range Pitocin at 12, continue increasing per protocol Expectant management, will reassess for AROM Anticipate SVD  Cleda ClarksElizabeth W. Aleph Nickson, DO  [facility] Fellow [facility], [facility]

## 2016-03-07 NOTE — Lactation Note (Signed)
This note was copied from a baby's chart. Lactation Consultation Note  Patient Name: Marissa Martina SinnerJennifer Sowle ZOXWR'UToday's Date: 03/07/2016 Reason for consult: Initial assessment Baby at 10 hr of life. Mom denies breast pain or soreness, voiced no concerns. Mom stated her older child was allergic to her milk so she switched to [place] Soy. She thinks she had low milk supply with her middle child and stopped bf at 1 month. She would like to bf 4171m with this baby. Discussed baby behavior, feeding frequency, baby belly size, voids, wt loss, breast changes, and nipple care. She stated she can manually express and has spoon in room. She requested Harmony to take because she is not sure she want to use the "hand me down" DEBP she has. Given lactation handouts. Aware of OP services and support group.    Maternal Data Has patient been taught Hand Expression?: Yes Does the patient have breastfeeding experience prior to this delivery?: Yes  Feeding Feeding Type: Breast Fed  LATCH Score/Interventions                      Lactation Tools Discussed/Used WIC Program: Yes   Consult Status Consult Status: Follow-up Date: 03/08/16 Follow-up type: In-patient    Rulon Eisenmengerlizabeth E Kiki Bivens 03/07/2016, 6:46 PM

## 2016-03-07 NOTE — Progress Notes (Signed)
[id number] dr Gentry RochJudd Consulted if pt needed CBC before pt had epidural removed per night RN's report. After review of history, Dr Gentry RochJudd states epidural may be removed without CBC.

## 2016-03-07 NOTE — Anesthesia Postprocedure Evaluation (Signed)
Anesthesia Post Note  Patient: San JettyJennifer J Mckenney  Procedure(s) Performed: * No procedures listed *  Patient location during evaluation: Mother Baby Anesthesia Type: Epidural Level of consciousness: awake and alert and oriented Pain management: satisfactory to patient Vital Signs Assessment: post-procedure vital signs reviewed and stable Respiratory status: spontaneous breathing and nonlabored ventilation Cardiovascular status: stable Postop Assessment: no headache, no backache, no signs of nausea or vomiting, adequate PO intake and patient able to bend at knees (patient up walking) Anesthetic complications: no     Last Vitals:  Vitals:   03/07/16 1049 03/07/16 1200  BP: (!) 149/77 138/74  Pulse: 89 90  Resp: 20 20  Temp: 37 C 36.8 C    Last Pain:  Vitals:   03/07/16 1200  TempSrc: Oral  PainSc: 0-No pain   Pain Goal: Patients Stated Pain Goal: 4 (03/06/16 2128)               Madison HickmanGREGORY,Demonta Wombles

## 2016-03-07 NOTE — Anesthesia Procedure Notes (Signed)
Epidural Patient location during procedure: OB  Staffing Anesthesiologist: Nayanna Seaborn Performed: anesthesiologist   Preanesthetic Checklist Completed: patient identified, site marked, surgical consent, pre-op evaluation, timeout performed, IV checked, risks and benefits discussed and monitors and equipment checked  Epidural Patient position: sitting Prep: site prepped and draped and DuraPrep Patient monitoring: continuous pulse ox and blood pressure Approach: midline Location: L3-L4 Injection technique: LOR saline  Needle:  Needle type: Tuohy  Needle gauge: 17 G Needle length: 9 cm and 9 Needle insertion depth: 5 cm cm Catheter type: closed end flexible Catheter size: 19 Gauge Catheter at skin depth: 10 cm Test dose: negative  Assessment Events: blood not aspirated, injection not painful, no injection resistance, negative IV test and no paresthesia  Additional Notes Patient identified. Risks/Benefits/Options discussed with patient including but not limited to bleeding, infection, nerve damage, paralysis, failed block, incomplete pain control, headache, blood pressure changes, nausea, vomiting, reactions to medication both or allergic, itching and postpartum back pain. Confirmed with bedside nurse the patient's most recent platelet count. Confirmed with patient that they are not currently taking any anticoagulation, have any bleeding history or any family history of bleeding disorders. Patient expressed understanding and wished to proceed. All questions were answered. Sterile technique was used throughout the entire procedure. Please see nursing notes for vital signs. Test dose was given through epidural catheter and negative prior to continuing to dose epidural or start infusion. Warning signs of high block given to the patient including shortness of breath, tingling/numbness in hands, complete motor block, or any concerning symptoms with instructions to call for help. Patient was  given instructions on fall risk and not to get out of bed. All questions and concerns addressed with instructions to call with any issues or inadequate analgesia.        

## 2016-03-07 NOTE — Progress Notes (Signed)
Patient ID: San JettyJennifer J Bova, female   DOB: 11/22/1980, 35 y.o.   MRN: [id number]  S: Patient seen & examined for progress of labor induction. Comfortable with epidural. AROM discussed with patient who agrees with plan.  O:  Vitals:   03/07/16 0601 03/07/16 0631  BP: (!) 156/97 (!) 158/89  Pulse: 96 96  Resp: 18 18  Temp:     Dilation: 6 Effacement (%): 60, 70 Station: -2 Presentation: Vertex Exam by:: Cletis MediaK. Anderson, RN Bulging bag  AROM performed with clear fluid return and some bloody return as well.  FHT: 130, mod var, +accels, no decels TOCO: q372min. Pitocin at 16 mU  A/P: San JettyJennifer J Spear is a 35 y.o. [id number] 3720w5d here for IOL for CHTN with SIPE without severe features.  AROM performed, clear fluid GBS + with PCN Expectant management Anticipate SVD

## 2016-03-08 LAB — CBC
HEMATOCRIT: 32.6 % — AB (ref 36.0–46.0)
Hemoglobin: 10.9 g/dL — ABNORMAL LOW (ref 12.0–15.0)
MCH: 29.6 pg (ref 26.0–34.0)
MCHC: 33.4 g/dL (ref 30.0–36.0)
MCV: 88.6 fL (ref 78.0–100.0)
Platelets: 250 10*3/uL (ref 150–400)
RBC: 3.68 MIL/uL — AB (ref 3.87–5.11)
RDW: 15 % (ref 11.5–15.5)
WBC: 10 10*3/uL (ref 4.0–10.5)

## 2016-03-08 MED ORDER — RHO D IMMUNE GLOBULIN 1500 UNIT/2ML IJ SOSY
300.0000 ug | PREFILLED_SYRINGE | Freq: Once | INTRAMUSCULAR | Status: AC
  Administered 2016-03-08: 300 ug via INTRAVENOUS
  Filled 2016-03-08: qty 2

## 2016-03-08 MED ORDER — AMLODIPINE BESYLATE 5 MG PO TABS: 5.0000 mg | ORAL_TABLET | Freq: Every day | ORAL | 2 refills | Status: DC

## 2016-03-08 MED ORDER — IBUPROFEN 600 MG PO TABS: 600.0000 mg | ORAL_TABLET | Freq: Four times a day (QID) | ORAL | 0 refills | Status: DC

## 2016-03-08 NOTE — Discharge Summary (Signed)
OB Discharge Summary     Patient Name: Marissa Reese DOB: October 10, 1980 MRN: [id number]  Date of admission: 03/06/2016 Delivering MD: Michaele Offer   Date of discharge: 03/08/2016  Admitting diagnosis: 38 wks high bp sharp ctx headaches Intrauterine pregnancy: [redacted]w[redacted]d     Secondary diagnosis:  Active Problems:   SVD (spontaneous vaginal delivery)  Additional problems: none     Discharge diagnosis: Term Pregnancy Delivered                                                                                                Post partum procedures:none  Augmentation: AROM and Pitocin  Complications: None  Hospital course:  Induction of Labor With Vaginal Delivery   35 y.o. yo G3P3003 at [redacted]w[redacted]d was admitted to the hospital 03/06/2016 for induction of labor.  Indication for induction: Preeclampsia.  Patient had an uncomplicated labor course as follows: Membrane Rupture Time/Date: 6:48 AM ,03/07/2016   Intrapartum Procedures: Episiotomy: None [1]                                         Lacerations:  None [1]  Patient had delivery of a Viable infant.  Information for the patient's newborn:  Marissa, Reese [[id number]]  Delivery Method: Vaginal, Spontaneous Delivery (Filed from Delivery Summary)   03/07/2016  Details of delivery can be found in separate delivery note.  Patient had a routine postpartum course. Patient is discharged home 03/09/16.   Physical exam Vitals:   03/07/16 1049 03/07/16 1200 03/07/16 1600 03/08/16 0541  BP: (!) 149/77 138/74 135/86 (!) 141/84  Pulse: 89 90 89 88  Resp: 20 20 20 18   Temp: 98.6 F (37 C) 98.3 F (36.8 C) 98.3 F (36.8 C) 97.8 F (36.6 C)  TempSrc: Oral Oral Oral Oral  SpO2:      Weight:      Height:       General: alert, cooperative and no distress Lochia: appropriate Uterine Fundus: firm Incision: N/A DVT Evaluation: No evidence of DVT seen on physical exam. No significant calf/ankle edema. Labs: Lab Results   Component Value Date   WBC 10.0 03/08/2016   HGB 10.9 (L) 03/08/2016   HCT 32.6 (L) 03/08/2016   MCV 88.6 03/08/2016   PLT 250 03/08/2016   CMP Latest Ref Rng & Units 03/06/2016  Glucose 65 - 99 mg/dL 83  BUN 6 - 20 mg/dL <7(W)  Creatinine 2.95 - 1.00 mg/dL 6.21  Sodium 308 - 657 mmol/L 137  Potassium 3.5 - 5.1 mmol/L 3.4(L)  Chloride 101 - 111 mmol/L 108  CO2 22 - 32 mmol/L 20(L)  Calcium 8.9 - 10.3 mg/dL 8.4(O)  Total Protein 6.5 - 8.1 g/dL 6.4(L)  Total Bilirubin 0.3 - 1.2 mg/dL 0.5  Alkaline Phos 38 - 126 U/L 122  AST 15 - 41 U/L 17  ALT 14 - 54 U/L 16    Discharge instruction: per After Visit Summary and "Baby and Me Booklet".  After visit meds:  Medication List    STOP taking these medications   labetalol 200 MG tablet Commonly known as:  NORMODYNE     TAKE these medications   acetaminophen 500 MG tablet Commonly known as:  TYLENOL Take 1,000 mg by mouth every 6 (six) hours as needed for mild pain, moderate pain or headache.   amLODipine 5 MG tablet Commonly known as:  NORVASC Take 1 tablet (5 mg total) by mouth daily. Start taking on:  03/09/2016   aspirin EC 81 MG tablet Take 81 mg by mouth at bedtime.   buPROPion 300 MG 24 hr tablet Commonly known as:  WELLBUTRIN XL Take 300 mg by mouth daily.   ibuprofen 600 MG tablet Commonly known as:  ADVIL,MOTRIN Take 1 tablet (600 mg total) by mouth every 6 (six) hours.   PRENATAL 1 30-0.975-200 MG Caps Take 1 capsule by mouth 2 (two) times daily.       Diet: routine diet  Activity: Advance as tolerated. Pelvic rest for 6 weeks.   Outpatient follow up:2 weeks for BP check.  Follow up Appt: Future Appointments Date Time Provider Department Center  03/19/2016 11:45 AM Lazaro ArmsLuther H Eure, MD [facility]    Postpartum contraception: IUD Mirena  Newborn Data: Live born female  Birth Weight: 7 lb 3 oz (3260 g) APGAR: 9, 9  Baby Feeding: Breast Disposition:home with mother   03/08/2016 Marissa PearJulie  P Degele, MD

## 2016-03-08 NOTE — Progress Notes (Signed)
MOB was referred for history of depression/anxiety. * Referral screened out by Clinical Social Worker because none of the following criteria appear to apply: ~ History of anxiety/depression during this pregnancy, or of post-partum depression. ~ Diagnosis of anxiety and/or depression within last 3 years OR * MOB's symptoms currently being treated with medication and/or therapy. Please contact the Clinical Social Worker if needs arise, or if MOB requests.  Chrsitopher Wik Boyd-Gilyard, MSW, LCSW Clinical Social Work [phone] 

## 2016-03-08 NOTE — Plan of Care (Signed)
Problem: Nutritional: Goal: Mother's verbalization of comfort with breastfeeding process will improve Outcome: Progressing Able to hand express and use hand pump successfully.  Understands effectiveness of skin2skin contact.  Problem: Bowel/Gastric: Goal: Gastrointestinal status will improve Outcome: Progressing Patient passing gas

## 2016-03-08 NOTE — Lactation Note (Signed)
This note was copied from a baby's chart. Lactation Consultation Note  Patient Name: Marissa Martina SinnerJennifer Fayette RUEAV'WToday's Date: 03/08/2016 Reason for consult: Follow-up assessment  Baby is 30 hours old and for an early discharge.  Per mom had multiply breast changes with this pregnancy and leaked early.  1st baby - doesn't remember milk coming in, 2nd baby milk came in , but never felt  she had enough for her baby. Baby seemed hungry.  This baby steering in crib , LC checked and changed a wet diaper. Placed baby skin to skin / football/ and reviewed basics - mom hand expressed 1st / steady flow of colostrum. Baby latched 1st with depth and assist , multiply swallows , increased with breast compressions. Released after 5 mins , rested a few mins and re- latched in football same breast and fed another 15 mins. After 15 mins , baby relaxed and stopped sucking. LC had mom release suction, nipple well rounded. Baby fell asleep next to mom.  Mom denies soreness. LC noted some edema at the base of the nipple to areola - shells provided with instructions as preventive measure from  soreness, and to enhance resolving edema for a deeper latch.  Sore nipple and engorgement prevention and tx reviewed.  Mom already has a hand pump. LC increased flanges x2 for when milk comes in - size #27 . Mom aware.  Praised mom for how well baby latched and baby swallowing like a 4 day old .  Mother informed of post-discharge support and given phone number to the lactation department, including services for phone call assistance; out-patient appointments; and breastfeeding support group. List of other breastfeeding resources in the community given in the handout. Encouraged mother to call for problems or concerns related to breastfeeding.   Maternal Data Has patient been taught Hand Expression?: Yes  Feeding Feeding Type: Breast Fed Length of feed: 15 min (multiply swallows and then released )  LATCH  Score/Interventions Latch: Grasps breast easily, tongue down, lips flanged, rhythmical sucking. Intervention(s): Adjust position;Assist with latch;Breast massage;Breast compression  Audible Swallowing: Spontaneous and intermittent  Type of Nipple: Everted at rest and after stimulation  Comfort (Breast/Nipple): Filling, red/small blisters or bruises, mild/mod discomfort  Problem noted: Filling  Hold (Positioning): Assistance needed to correctly position infant at breast and maintain latch. (mom latched, and LC assisted with depth ) Intervention(s): Breastfeeding basics reviewed;Support Pillows;Position options;Skin to skin  LATCH Score: 8  Lactation Tools Discussed/Used Tools: Pump Breast pump type: Manual Pump Review: Milk Storage   Consult Status Consult Status: Complete Date: 03/08/16 Follow-up type: In-patient    [name], [name] 03/08/2016, 3:00 PM

## 2016-03-08 NOTE — Discharge Instructions (Signed)

## 2016-03-08 NOTE — Progress Notes (Signed)
Post Partum Day 1 Subjective: no complaints, up ad lib, voiding, tolerating PO and + flatus  Objective: Blood pressure (!) 141/84, pulse 88, temperature 97.8 F (36.6 C), temperature source Oral, resp. rate 18, height 5' (1.524 m), weight 78.9 kg (174 lb), last menstrual period 06/10/2015, SpO2 99 %, unknown if currently breastfeeding.  Physical Exam:  General: alert, cooperative and no distress Lochia: appropriate Uterine Fundus: firm DVT Evaluation: No evidence of DVT seen on physical exam. Negative Homan's sign.   Recent Labs  03/06/16 1855 03/08/16 0529  HGB 11.9* 10.9*  HCT 35.8* 32.6*    Assessment/Plan: Plan for discharge tomorrow vs later today.   LOS: 2 days   Marissa Reese 03/08/2016, 7:42 AM

## 2016-03-09 ENCOUNTER — Inpatient Hospital Stay (HOSPITAL_COMMUNITY): Admission: RE | Admit: 2016-03-09 | Payer: Medicaid Other | Source: Ambulatory Visit

## 2016-03-09 LAB — RH IG WORKUP (INCLUDES ABO/RH)
ABO/RH(D): A NEG
FETAL SCREEN: NEGATIVE
Gestational Age(Wks): 38.5
Unit division: 0

## 2016-03-10 LAB — TYPE AND SCREEN
ABO/RH(D): A NEG
ANTIBODY SCREEN: POSITIVE
DAT, IgG: NEGATIVE
UNIT DIVISION: 0
Unit division: 0

## 2016-03-13 NOTE — Discharge Summary (Signed)
OB Discharge Summary                           Patient Name: Marissa Reese DOB: 03/14/1981 MRN: [id number]  Date of admission: 03/06/2016 Delivering MD: Michaele OfferMUMAW, ELIZABETH WOODLAND   Date of discharge: 03/08/2016  Admitting diagnosis: 38 wks high bp sharp ctx headaches Intrauterine pregnancy: 262w5d     Secondary diagnosis:  Active Problems:   SVD (spontaneous vaginal delivery)  Additional problems: none                                      Discharge diagnosis: Term Pregnancy Delivered                                                                                                Post partum procedures:none  Augmentation: AROM and Pitocin  Complications: None  Hospital course:  Induction of Labor With Vaginal Delivery   35 y.o. yo G3P3003 at 422w5d was admitted to the hospital 03/06/2016 for induction of labor.  Indication for induction: Preeclampsia.  Patient had an uncomplicated labor course as follows: Membrane Rupture Time/Date: 6:48 AM ,03/07/2016   Intrapartum Procedures: Episiotomy: None [1]                                         Lacerations:  None [1]  Patient had delivery of a Viable infant.  Information for the patient's newborn:  Lum Babeiemczura, Austin Michael [[id number]][[id number]]  Delivery Method: Vaginal, Spontaneous Delivery (Filed from Delivery Summary)   03/07/2016  Details of delivery can be found in separate delivery note.  Patient had a routine postpartum course. Patient is discharged home 03/09/16.         Physical exam Vitals:   03/07/16 1049 03/07/16 1200 03/07/16 1600 03/08/16 0541  BP: (!) 149/77 138/74 135/86 (!) 141/84  Pulse: 89 90 89 88  Resp: 20 20 20 18   Temp: 98.6 F (37 C) 98.3 F (36.8 C) 98.3 F (36.8 C) 97.8 F (36.6 C)  TempSrc: Oral Oral Oral Oral  SpO2:      Weight:      Height:       General: alert, cooperative and no distress Lochia: appropriate Uterine Fundus: firm Incision: N/A DVT  Evaluation: No evidence of DVT seen on physical exam. No significant calf/ankle edema. Labs: Recent Labs       Lab Results  Component Value Date   WBC 10.0 03/08/2016   HGB 10.9 (L) 03/08/2016   HCT 32.6 (L) 03/08/2016   MCV 88.6 03/08/2016   PLT 250 03/08/2016     CMP Latest Ref Rng & Units 03/06/2016  Glucose 65 - 99 mg/dL 83  BUN 6 - 20 mg/dL <7(W<5(L)  Creatinine 2.950.44 - 1.00 mg/dL 6.210.53  Sodium 308135 - 657145 mmol/L 137  Potassium 3.5 - 5.1 mmol/L 3.4(L)  Chloride 101 - 111 mmol/L 108  CO2 22 -  32 mmol/L 20(L)  Calcium 8.9 - 10.3 mg/dL 1.6(X)  Total Protein 6.5 - 8.1 g/dL 6.4(L)  Total Bilirubin 0.3 - 1.2 mg/dL 0.5  Alkaline Phos 38 - 126 U/L 122  AST 15 - 41 U/L 17  ALT 14 - 54 U/L 16    Discharge instruction: per After Visit Summary and "Baby and Me Booklet".  After visit meds:    Medication List    STOP taking these medications   labetalol 200 MG tablet Commonly known as:  NORMODYNE    TAKE these medications   acetaminophen 500 MG tablet Commonly known as:  TYLENOL Take 1,000 mg by mouth every 6 (six) hours as needed for mild pain, moderate pain or headache.  amLODipine 5 MG tablet Commonly known as:  NORVASC Take 1 tablet (5 mg total) by mouth daily. Start taking on:  03/09/2016  aspirin EC 81 MG tablet Take 81 mg by mouth at bedtime.  buPROPion 300 MG 24 hr tablet Commonly known as:  WELLBUTRIN XL Take 300 mg by mouth daily.  ibuprofen 600 MG tablet Commonly known as:  ADVIL,MOTRIN Take 1 tablet (600 mg total) by mouth every 6 (six) hours.  PRENATAL 1 30-0.975-200 MG Caps Take 1 capsule by mouth 2 (two) times daily.      Diet: routine diet  Activity: Advance as tolerated. Pelvic rest for 6 weeks.   Outpatient follow up:2 weeks for BP check.  Follow up Appt: Future Appointments Date Time Provider Department Center  03/19/2016 11:45 AM Lazaro Arms, MD [facility]    Postpartum contraception: IUD Mirena  Newborn  Data: Live born female  Birth Weight: 7 lb 3 oz (3260 g) APGAR: 9, 9  Baby Feeding: Breast Disposition:home with mother   03/08/2016 [name], MD

## 2016-03-14 ENCOUNTER — Encounter (HOSPITAL_COMMUNITY): Payer: Self-pay

## 2016-03-14 ENCOUNTER — Observation Stay (HOSPITAL_COMMUNITY)
Admission: AD | Admit: 2016-03-14 | Discharge: 2016-03-15 | Disposition: A | Discharge status: Home / Self Care | Payer: Medicaid Other | Source: Ambulatory Visit | Attending: Family Medicine | Admitting: Family Medicine

## 2016-03-14 DIAGNOSIS — O1405 Mild to moderate pre-eclampsia, complicating the puerperium: Principal | ICD-10-CM | POA: Diagnosis present

## 2016-03-14 DIAGNOSIS — Z7982 Long term (current) use of aspirin: Secondary | ICD-10-CM | POA: Diagnosis not present

## 2016-03-14 DIAGNOSIS — Z91040 Latex allergy status: Secondary | ICD-10-CM | POA: Diagnosis not present

## 2016-03-14 DIAGNOSIS — Z87891 Personal history of nicotine dependence: Secondary | ICD-10-CM | POA: Insufficient documentation

## 2016-03-14 LAB — COMPREHENSIVE METABOLIC PANEL
ALBUMIN: 3.5 g/dL (ref 3.5–5.0)
ALK PHOS: 98 U/L (ref 38–126)
ALT: 29 U/L (ref 14–54)
ANION GAP: 9 (ref 5–15)
AST: 24 U/L (ref 15–41)
BUN: 10 mg/dL (ref 6–20)
CALCIUM: 8.8 mg/dL — AB (ref 8.9–10.3)
CO2: 25 mmol/L (ref 22–32)
CREATININE: 0.88 mg/dL (ref 0.44–1.00)
Chloride: 104 mmol/L (ref 101–111)
GFR calc Af Amer: >60 mL/min (ref >60)
GFR calc non Af Amer: >60 mL/min (ref >60)
GLUCOSE: 103 mg/dL — AB (ref 65–99)
Potassium: 3.3 mmol/L — ABNORMAL LOW (ref 3.5–5.1)
Sodium: 138 mmol/L (ref 135–145)
TOTAL PROTEIN: 6.7 g/dL (ref 6.5–8.1)

## 2016-03-14 LAB — URINALYSIS, ROUTINE W REFLEX MICROSCOPIC
BILIRUBIN URINE: NEGATIVE
Glucose, UA: NEGATIVE mg/dL
Ketones, ur: NEGATIVE mg/dL
NITRITE: NEGATIVE
PH: 6 (ref 5.0–8.0)
Protein, ur: NEGATIVE mg/dL
SPECIFIC GRAVITY, URINE: 1.01 (ref 1.005–1.030)

## 2016-03-14 LAB — URINE MICROSCOPIC-ADD ON

## 2016-03-14 LAB — CBC
HCT: 36 % (ref 36.0–46.0)
Hemoglobin: 11.9 g/dL — ABNORMAL LOW (ref 12.0–15.0)
MCH: 29.8 pg (ref 26.0–34.0)
MCHC: 33.1 g/dL (ref 30.0–36.0)
MCV: 90.2 fL (ref 78.0–100.0)
PLATELETS: 385 10*3/uL (ref 150–400)
RBC: 3.99 MIL/uL (ref 3.87–5.11)
RDW: 14.1 % (ref 11.5–15.5)
WBC: 8 10*3/uL (ref 4.0–10.5)

## 2016-03-14 LAB — PROTEIN / CREATININE RATIO, URINE
CREATININE, URINE: 91 mg/dL
Protein Creatinine Ratio: 0.09 mg/mg{Cre} (ref 0.00–0.15)
TOTAL PROTEIN, URINE: 8 mg/dL

## 2016-03-14 MED ORDER — MAGNESIUM SULFATE 50 % IJ SOLN
2.0000 g/h | INTRAVENOUS | Status: AC
  Administered 2016-03-14 (×2): 2 g/h via INTRAVENOUS
  Filled 2016-03-14 (×2): qty 80

## 2016-03-14 MED ORDER — DIPHENHYDRAMINE HCL 50 MG/ML IJ SOLN
25.0000 mg | Freq: Once | INTRAMUSCULAR | Status: AC
  Administered 2016-03-14: 25 mg via INTRAVENOUS
  Filled 2016-03-14: qty 1

## 2016-03-14 MED ORDER — ACETAMINOPHEN 650 MG RE SUPP: 650.0000 mg | Freq: Four times a day (QID) | RECTAL | Status: DC | PRN

## 2016-03-14 MED ORDER — HYDRALAZINE HCL 20 MG/ML IJ SOLN
5.0000 mg | INTRAMUSCULAR | Status: DC | PRN
Start: 2016-03-14 — End: 2016-03-14
  Administered 2016-03-14: 5 mg via INTRAVENOUS
  Filled 2016-03-14: qty 1

## 2016-03-14 MED ORDER — LACTATED RINGERS IV SOLN
INTRAVENOUS | Status: DC
  Administered 2016-03-14: 22:00:00 via INTRAVENOUS

## 2016-03-14 MED ORDER — AMLODIPINE BESYLATE 10 MG PO TABS
10.0000 mg | ORAL_TABLET | Freq: Every day | ORAL | Status: DC
  Administered 2016-03-14 – 2016-03-15 (×2): 10 mg via ORAL
  Filled 2016-03-14 (×2): qty 1

## 2016-03-14 MED ORDER — BUPROPION HCL ER (XL) 300 MG PO TB24
300.0000 mg | ORAL_TABLET | Freq: Every day | ORAL | Status: DC
  Administered 2016-03-14: 300 mg via ORAL
  Filled 2016-03-14 (×2): qty 1

## 2016-03-14 MED ORDER — METOCLOPRAMIDE HCL 5 MG/ML IJ SOLN
10.0000 mg | Freq: Once | INTRAMUSCULAR | Status: AC
  Administered 2016-03-14: 10 mg via INTRAVENOUS
  Filled 2016-03-14: qty 2

## 2016-03-14 MED ORDER — LACTATED RINGERS IV SOLN
INTRAVENOUS | Status: DC
  Administered 2016-03-14: 125 mL/h via INTRAVENOUS

## 2016-03-14 MED ORDER — HYDROCHLOROTHIAZIDE 25 MG PO TABS
25.0000 mg | ORAL_TABLET | Freq: Every day | ORAL | Status: DC
Start: 2016-03-14 — End: 2016-03-15
  Administered 2016-03-14 – 2016-03-15 (×2): 25 mg via ORAL
  Filled 2016-03-14 (×2): qty 1

## 2016-03-14 MED ORDER — LABETALOL HCL 5 MG/ML IV SOLN: 20.0000 mg | INTRAVENOUS | Status: DC | PRN

## 2016-03-14 MED ORDER — MAGNESIUM SULFATE 50 % IJ SOLN: 2.0000 g/h | INTRAVENOUS | Status: DC

## 2016-03-14 MED ORDER — DEXAMETHASONE SODIUM PHOSPHATE 10 MG/ML IJ SOLN
10.0000 mg | Freq: Once | INTRAMUSCULAR | Status: AC
  Administered 2016-03-14: 10 mg via INTRAVENOUS
  Filled 2016-03-14: qty 1

## 2016-03-14 MED ORDER — HYDRALAZINE HCL 20 MG/ML IJ SOLN: 5.0000 mg | INTRAMUSCULAR | Status: DC | PRN

## 2016-03-14 MED ORDER — ACETAMINOPHEN 325 MG PO TABS
650.0000 mg | ORAL_TABLET | Freq: Four times a day (QID) | ORAL | Status: DC | PRN
  Administered 2016-03-14 – 2016-03-15 (×2): 650 mg via ORAL
  Filled 2016-03-14 (×2): qty 2

## 2016-03-14 MED ORDER — MAGNESIUM SULFATE BOLUS VIA INFUSION
4.0000 g | Freq: Once | INTRAVENOUS | Status: AC
  Administered 2016-03-14: 4 g via INTRAVENOUS
  Filled 2016-03-14: qty 500

## 2016-03-14 MED ORDER — BUTALBITAL-APAP-CAFFEINE 50-325-40 MG PO TABS
2.0000 | ORAL_TABLET | Freq: Four times a day (QID) | ORAL | Status: DC | PRN
  Administered 2016-03-14: 2 via ORAL
  Filled 2016-03-14: qty 2

## 2016-03-14 NOTE — Lactation Note (Signed)
Lactation Consultation Note  Patient Name: Marissa Reese KGYBN'L Date: 03/14/2016   Initial consult with mom who is a readmit d/t BP issues. Mom had questions regarding Medela pump they have obtained from a friend.   LC explained that Medela pumps are one user pumps but if they do decide to use it anyways, LC explained how to clean pump by removing face plate, cleaning, and then resembling pump parts for working order.   Mom has her own new tubing and pumping kit.  Parents plan to purchase additional pumping pieces in future for pump so that mom has more than one set. Parents very appreciative of information given.     Merlene Laughter 03/14/2016, 3:36 PM

## 2016-03-14 NOTE — H&P (Signed)
Marissa Reese is an 35 y.o. female G3 now P3 is postpartum day 7 from a normal vaginal delivery. Patient did have chronic hypertension and had been on labetalol. On discharge she was transitioned to amlodipine 5 mg daily. She reports she's had blood pressures in the 140s and 150s over 90s and occasionally 100s ever since discharge but today she felt very poorly and had a severe headache. This evening she took her blood pressure was 160s over 120s. She attempted to help her headache with ibuprofen Tylenol and aspirin and nothing seemed to improve it. She denies significant shortness of breath or chest pain as well as right upper quadrant pain. She denies any significant swelling.   In the MAU patient was placed on the hydralazine protocol and did receive 1 dose of IV push 10 mg hydralazine prior to starting magnesium.    Past Medical History:  Diagnosis Date  . Anxiety   . Depression   . Essential hypertension    Medication since 04/2015  . History of headache   . Hypertension    chronic, was on Lisinopril prior to preg.  . Pregnant 07/17/2015  . Vaginal itching 08/19/2015  . Yeast infection 08/19/2015    Past Surgical History:  Procedure Laterality Date  . CHOLECYSTECTOMY      Family History  Problem Relation Age of Onset  . Diabetes Father   . Heart attack Father     Age 41  . Hypertension Father   . Hypertension Mother   . Hyperlipidemia Mother   . Heart attack Maternal Grandfather   . Heart attack Paternal Grandmother   . Stroke Paternal Grandmother   . Heart attack Paternal Grandfather     Social History:  reports that she quit smoking about 8 months ago. Her smoking use included Cigarettes. She started smoking about 24 years ago. She smoked 0.00 packs per day for 17.00 years. She has never used smokeless tobacco. She reports that she does not drink alcohol or use drugs.  Allergies:  Allergies  Allergen Reactions  . Zolpidem Tartrate Other (See Comments)    Reaction:   Hallucinations   . Latex Itching and Rash    Prescriptions Prior to Admission  Medication Sig Dispense Refill Last Dose  . acetaminophen (TYLENOL) 500 MG tablet Take 1,000 mg by mouth every 6 (six) hours as needed for mild pain, moderate pain or headache.   03/13/2016 at Unkno1600wn time  . amLODipine (NORVASC) 5 MG tablet Take 1 tablet (5 mg total) by mouth daily. 30 tablet 2 03/13/2016 at 0900  . aspirin EC 81 MG tablet Take 81 mg by mouth at bedtime.    03/13/2016 at 1600  . buPROPion (WELLBUTRIN XL) 300 MG 24 hr tablet Take 300 mg by mouth daily.   03/13/2016 at Unknown time  . ibuprofen (ADVIL,MOTRIN) 600 MG tablet Take 1 tablet (600 mg total) by mouth every 6 (six) hours. 30 tablet 0 03/13/2016 at 0900  . Prenatal MV-Min-Fe Fum-FA-DHA (PRENATAL 1) 30-0.975-200 MG CAPS Take 1 capsule by mouth 2 (two) times daily.   03/13/2016 at Unknown time    Review of Systems  Constitutional: Negative for chills and fever.  HENT: Negative for congestion and tinnitus.   Eyes: Negative for blurred vision and double vision.  Respiratory: Negative for cough and sputum production.   Cardiovascular: Negative for chest pain and palpitations.  Gastrointestinal: Negative for abdominal pain, diarrhea, heartburn, nausea and vomiting.  Genitourinary: Negative for dysuria, frequency and urgency.  Musculoskeletal: Negative for  myalgias and neck pain.  Skin: Negative for itching and rash.  Neurological: Positive for headaches. Negative for dizziness and tingling.  Endo/Heme/Allergies: Does not bruise/bleed easily.    Blood pressure (!) 155/102, pulse 83, temperature 98.2 F (36.8 C), temperature source Oral, resp. rate 18, height 5' (1.524 m), weight 157 lb (71.2 kg), SpO2 98 %, unknown if currently breastfeeding. Physical Exam  Vitals reviewed. Constitutional: She is oriented to person, place, and time. She appears well-developed and well-nourished.  HENT:  Head: Normocephalic and atraumatic.  Eyes:  Conjunctivae and EOM are normal. Pupils are equal, round, and reactive to light.  Cardiovascular: Normal rate, regular rhythm and normal heart sounds.  Exam reveals no gallop and no friction rub.   No murmur heard. Respiratory: Effort normal and breath sounds normal. No respiratory distress. She has no wheezes. She has no rales.  GI: Soft. Bowel sounds are normal. She exhibits no distension. There is no tenderness. There is no rebound and no guarding.  Musculoskeletal: Normal range of motion. She exhibits no edema.  Neurological: She is alert and oriented to person, place, and time. No cranial nerve deficit. Coordination normal.  1-2 beats of clonus bilaterally, 3+ reflexes patellar bilaterally  Skin: Skin is warm and dry.  Psychiatric: She has a normal mood and affect. Her behavior is normal.   Vitals:   03/14/16 0230 03/14/16 0239 03/14/16 0249 03/14/16 0259  BP: (!) 164/101 160/99 (!) 155/102 (!) 160/105  Pulse: 85 83 83 90  Resp:      Temp:      TempSrc:      SpO2:      Weight:      Height:       Results for orders placed or performed during the hospital encounter of 03/14/16 (from the past 24 hour(s))  Urinalysis, Routine w reflex microscopic (not at [facility])     Status: Abnormal   Collection Time: 03/14/16  1:37 AM  Result Value Ref Range   Color, Urine YELLOW YELLOW   APPearance CLEAR CLEAR   Specific Gravity, Urine 1.010 1.005 - 1.030   pH 6.0 5.0 - 8.0   Glucose, UA NEGATIVE NEGATIVE mg/dL   Hgb urine dipstick LARGE (A) NEGATIVE   Bilirubin Urine NEGATIVE NEGATIVE   Ketones, ur NEGATIVE NEGATIVE mg/dL   Protein, ur NEGATIVE NEGATIVE mg/dL   Nitrite NEGATIVE NEGATIVE   Leukocytes, UA SMALL (A) NEGATIVE  Protein / creatinine ratio, urine     Status: None   Collection Time: 03/14/16  1:37 AM  Result Value Ref Range   Creatinine, Urine 91.00 mg/dL   Total Protein, Urine 8 mg/dL   Protein Creatinine Ratio 0.09 0.00 - 0.15 mg/mg[Cre]  Urine microscopic-add on     Status:  Abnormal   Collection Time: 03/14/16  1:37 AM  Result Value Ref Range   Squamous Epithelial / LPF 0-5 (A) NONE SEEN   WBC, UA 0-5 0 - 5 WBC/hpf   RBC / HPF 6-30 0 - 5 RBC/hpf   Bacteria, UA FEW (A) NONE SEEN  Comprehensive metabolic panel     Status: Abnormal (Preliminary result)   Collection Time: 03/14/16  1:59 AM  Result Value Ref Range   Sodium 138 135 - 145 mmol/L   Potassium 3.3 (L) 3.5 - 5.1 mmol/L   Chloride 104 101 - 111 mmol/L   CO2 25 22 - 32 mmol/L   Glucose, Bld 103 (H) 65 - 99 mg/dL   BUN 10 6 - 20 mg/dL   Creatinine,  Ser 0.88 0.44 - 1.00 mg/dL   Calcium 8.8 (L) 8.9 - 10.3 mg/dL   Total Protein 6.7 6.5 - 8.1 g/dL   Albumin 3.5 3.5 - 5.0 g/dL   AST 24 15 - 41 U/L   ALT 29 14 - 54 U/L   Alkaline Phosphatase 98 38 - 126 U/L   Total Bilirubin PENDING 0.3 - 1.2 mg/dL   GFR calc non Af Amer >60 >60 mL/min   GFR calc Af Amer >60 >60 mL/min   Anion gap 9 5 - 15  CBC     Status: Abnormal   Collection Time: 03/14/16  1:59 AM  Result Value Ref Range   WBC 8.0 4.0 - 10.5 K/uL   RBC 3.99 3.87 - 5.11 MIL/uL   Hemoglobin 11.9 (L) 12.0 - 15.0 g/dL   HCT 16.136.0 09.636.0 - 04.546.0 %   MCV 90.2 78.0 - 100.0 fL   MCH 29.8 26.0 - 34.0 pg   MCHC 33.1 30.0 - 36.0 g/dL   RDW 40.914.1 81.111.5 - 91.415.5 %   Platelets 385 150 - 400 K/uL    No results found.  Assessment/Plan: #1: Postpartum preeclampsia patient's labs found to be in normal limits but with severe range blood pressures headache, 3+ reflexes and clonus will to admit for 24 hours of magnesium. Followed by 2 g an hour. Patient placed on the IV hydralazine protocol. Labs will be repeated tomorrow at noon #2: Chronic hypertension: Patient will be increased to 10 mg by mouth of amlodipine and 25 mg of hydrochlorothiazide daily at this time. We'll continue to monitor blood pressure closely.  Ernestina Pennaicholas Deena Shaub 03/14/2016, 2:54 AM

## 2016-03-14 NOTE — MAU Note (Signed)
Pt delivered SVD on 03/07/2016. C/o HBP, states she didn't feel well and had HA. BP was 165/120 at home. States she has taken ibuprofen, tylenol and aspirin for HA-none helped. Rates 4/10. Denies vision changes or vision changes. Had high blood pressure during pregnancy, was on labetalol-was switched to amlodypine PP.

## 2016-03-14 NOTE — Plan of Care (Signed)
Problem: Education: Goal: Knowledge of [place] [facility] Education information/materials will improve Outcome: Completed/Met Date Met: 03/14/16 Plan of care discussed.Oriented to room and routine.  Problem: Safety: Goal: Ability to remain free from injury will improve Outcome: Completed/Met Date Met: 03/14/16 Ambulates without difficulty.  Problem: Health Behavior/Discharge Planning: Goal: Ability to manage health-related needs will improve Outcome: Completed/Met Date Met: 03/14/16 Patient made aware of Magnesium protocol.  Problem: Physical Regulation: Goal: Ability to maintain clinical measurements within normal limits will improve Outcome: Progressing Blood pressures have come down since on Magnesium drip. Goal: Will remain free from infection Outcome: Completed/Met Date Met: 03/14/16 Afebrile.No signs or symptoms of infection.  Problem: Activity: Goal: Risk for activity intolerance will decrease Outcome: Completed/Met Date Met: 03/14/16 Tolerates walking to bathroom without difficulty.  Problem: Fluid Volume: Goal: Ability to maintain a balanced intake and output will improve Outcome: Completed/Met Date Met: 03/14/16 Voids qs clear yellow urine.  Problem: Nutrition: Goal: Adequate nutrition will be maintained Outcome: Completed/Met Date Met: 03/14/16 Has been tolerating a Regular diet.  Problem: Bowel/Gastric: Goal: Will not experience complications related to bowel motility Outcome: Completed/Met Date Met: 03/14/16 Last BM 03/13/16

## 2016-03-15 MED ORDER — AMLODIPINE BESYLATE 10 MG PO TABS: 10.0000 mg | ORAL_TABLET | Freq: Every day | ORAL | 1 refills | Status: DC

## 2016-03-15 MED ORDER — HYDROCHLOROTHIAZIDE 25 MG PO TABS: 25.0000 mg | ORAL_TABLET | Freq: Every day | ORAL | 1 refills | Status: DC

## 2016-03-15 NOTE — Progress Notes (Signed)
Patient discharged home with family. Discharge instructions and paperwork reviewed with patient. Hypertensive handout given. No questions or concerns at this time.

## 2016-03-15 NOTE — Discharge Summary (Signed)
Physician Discharge Summary  Patient ID: Marissa Reese MRN: [id number] DOB/AGE: 34-07-1980 34 y.o.  Admit date: 03/14/2016 Discharge date: 03/15/2016   Discharge Diagnoses:  Principal Problem:   Mild pre-eclampsia, postpartum   Consults: None  Significant Diagnostic Studies: labs:  CBC    Component Value Date/Time   WBC 8.0 03/14/2016 0159   RBC 3.99 03/14/2016 0159   HGB 11.9 (L) 03/14/2016 0159   HCT 36.0 03/14/2016 0159   HCT 32.5 (L) 12/12/2015 0839   PLT 385 03/14/2016 0159   PLT 360 12/12/2015 0839   MCV 90.2 03/14/2016 0159   MCV 87 12/12/2015 0839   MCH 29.8 03/14/2016 0159   MCHC 33.1 03/14/2016 0159   RDW 14.1 03/14/2016 0159   RDW 13.4 12/12/2015 0839   LYMPHSABS 2.2 10/05/2015 1643   MONOABS 0.8 10/05/2015 1643   EOSABS 0.1 10/05/2015 1643   BASOSABS 0.0 10/05/2015 1643   CMP Latest Ref Rng & Units 03/14/2016 03/06/2016 03/01/2016  Glucose 65 - 99 mg/dL 409(W) 83 72  BUN 6 - 20 mg/dL 10 <1(X) <9(J)  Creatinine 0.44 - 1.00 mg/dL 4.78 2.95 6.21  Sodium 135 - 145 mmol/L 138 137 136  Potassium 3.5 - 5.1 mmol/L 3.3(L) 3.4(L) 3.5  Chloride 101 - 111 mmol/L 104 108 106  CO2 22 - 32 mmol/L 25 20(L) 23  Calcium 8.9 - 10.3 mg/dL 3.0(Q) 6.5(H) 8.4(O)  Total Protein 6.5 - 8.1 g/dL 6.7 6.4(L) 6.5  Total Bilirubin 0.3 - 1.2 mg/dL <9.6(E) 0.5 0.6  Alkaline Phos 38 - 126 U/L 98 122 110  AST 15 - 41 U/L 24 17 17   ALT 14 - 54 U/L 29 16 16    Pr:Cr ratio 0.09   Hospital Course: Admitted with elevated BP postpartum and headache. Required IV meds to control BP. Additional po meds given and Magnesium x 24 hours. Patient diuresed 2 L while in house. She was placed on Norvasc 10 and HCTZ 25 mg. BP's in the 110-130/70-80 at time of discharge.   Disposition: 01-Home or Self Care  Discharged Condition: improved  Discharge Instructions    Call MD for:  difficulty breathing, headache or visual disturbances    Complete by:  As directed    Call MD for:  extreme fatigue     Complete by:  As directed    Call MD for:  severe uncontrolled pain    Complete by:  As directed    Call MD for:  temperature >100.4    Complete by:  As directed    Diet - low sodium heart healthy    Complete by:  As directed    Increase activity slowly    Complete by:  As directed        Medication List    STOP taking these medications   aspirin EC 81 MG tablet     TAKE these medications   acetaminophen 500 MG tablet Commonly known as:  TYLENOL Take 1,000 mg by mouth every 6 (six) hours as needed for mild pain, moderate pain or headache.   amLODipine 10 MG tablet Commonly known as:  NORVASC Take 1 tablet (10 mg total) by mouth daily. What changed:  medication strength  how much to take   buPROPion 300 MG 24 hr tablet Commonly known as:  WELLBUTRIN XL Take 300 mg by mouth daily.   hydrochlorothiazide 25 MG tablet Commonly known as:  HYDRODIURIL Take 1 tablet (25 mg total) by mouth daily.   ibuprofen 600 MG tablet Commonly known as:  ADVIL,MOTRIN Take 1 tablet (600 mg total) by mouth every 6 (six) hours.   PRENATAL 1 30-0.975-200 MG Caps Take 1 capsule by mouth 2 (two) times daily.      Follow-up Information    [facility]   Why:  Keep next scheduled appointment Contact information: [address] [place] [phone]          Signed: Reva Boresanya S Tesneem Dufrane 03/15/2016, 7:58 AM

## 2016-03-15 NOTE — Discharge Instructions (Signed)

## 2016-03-19 ENCOUNTER — Ambulatory Visit (INDEPENDENT_AMBULATORY_CARE_PROVIDER_SITE_OTHER): Payer: Medicaid Other | Admitting: Obstetrics & Gynecology

## 2016-03-19 ENCOUNTER — Encounter: Payer: Self-pay | Admitting: Obstetrics & Gynecology

## 2016-03-19 VITALS — BP 140/100 | HR 90 | Wt 152.0 lb

## 2016-03-19 DIAGNOSIS — O165 Unspecified maternal hypertension, complicating the puerperium: Secondary | ICD-10-CM

## 2016-03-19 MED ORDER — LABETALOL HCL 100 MG PO TABS: 100.0000 mg | ORAL_TABLET | Freq: Two times a day (BID) | ORAL | 1 refills | Status: DC

## 2016-03-19 NOTE — Progress Notes (Signed)
Chief Complaint  Patient presents with  . Blood Pressure Check    Blood pressure (!) 140/100, pulse 90, weight 152 lb (68.9 kg), unknown if currently breastfeeding.   12 days post partum from induced vaginal delivery BP have been a bit labile, not adequately controlled at home on amlodipine and HCTZ  Will add labetalol 100 BID to regimen, if BP gets too low drop off the labetalol  the patient instructed to message me through my chart if ay questions regrding med management or BP issues  Follow up 4 weeks pp exam     Face to face time:  15 minutes  Greater than 50% of the visit time was spent in counseling and coordination of care with the patient.  The summary and outline of the counseling and care coordination is summarized in the note above.   All questions were answered.

## 2016-04-07 ENCOUNTER — Telehealth: Payer: Self-pay | Admitting: Adult Health

## 2016-04-07 NOTE — Telephone Encounter (Signed)
Spoke with pt's husband. Pt had dental work this am and is breastfeeding and wanted to make sure meds they used was ok. Pt was given Marcaine 0.5 mg, Septocaine 4%, and Lidocaine 2%. She was sent home with Keflex. I spoke with JAG and she advised all that was safe with breastfeeding. Pt's husband was notified and voiced understanding. JSY

## 2016-04-07 NOTE — Telephone Encounter (Signed)
Left message x 1. JSY 

## 2016-04-22 ENCOUNTER — Encounter: Payer: Self-pay | Admitting: Obstetrics & Gynecology

## 2016-04-22 ENCOUNTER — Ambulatory Visit (INDEPENDENT_AMBULATORY_CARE_PROVIDER_SITE_OTHER): Payer: Medicaid Other | Admitting: Obstetrics & Gynecology

## 2016-04-22 MED ORDER — NORETHINDRONE 0.35 MG PO TABS: 1.0000 | ORAL_TABLET | Freq: Every day | ORAL | 11 refills | Status: DC

## 2016-04-22 NOTE — Progress Notes (Signed)
Subjective:     Marissa Reese is a 35 y.o. female who presents for a postpartum visit. She is 6 weeks postpartum following a spontaneous vaginal delivery. I have fully reviewed the prenatal and intrapartum course. The delivery was at 39 gestational weeks. Outcome: spontaneous vaginal delivery. Anesthesia: epidural. Postpartum course has been unremarkable. Baby's course has been normal. Baby is feeding by breast. Bleeding no bleeding. Bowel function is normal. Bladder function is normal. Patient is sexually active. Contraception method is none. Postpartum depression screening: negative.  The following portions of the patient's history were reviewed and updated as appropriate: allergies, current medications, past family history, past medical history, past social history, past surgical history and problem list.  Review of Systems Pertinent items are noted in HPI.   Objective:    BP 120/90   Pulse 80   Wt 155 lb (70.3 kg)   BMI 30.27 kg/m   General:  alert, cooperative and no distress   Breasts:    Lungs:   Heart:    Abdomen: soft, non-tender; bowel sounds normal; no masses,  no organomegaly   Vulva:  normal  Vagina: normal vagina, no discharge, exudate, lesion, or erythema  Cervix:  no cervical motion tenderness and no lesions  Corpus: normal size, contour, position, consistency, mobility, non-tender  Adnexa:  normal adnexa  Rectal Exam:         Assessment:     normal postpartum exam. Pap smear not done at today's visit.   Plan:    1. Contraception: oral progesterone-only contraceptive 2. Wants Mirena 3. Follow up in: 2 weeks or as needed.

## 2016-05-05 ENCOUNTER — Ambulatory Visit: Payer: Medicaid Other | Admitting: Advanced Practice Midwife

## 2016-05-17 ENCOUNTER — Ambulatory Visit: Payer: Medicaid Other | Admitting: Women's Health

## 2016-05-18 ENCOUNTER — Other Ambulatory Visit: Payer: Self-pay | Admitting: Obstetrics & Gynecology

## 2016-07-16 ENCOUNTER — Other Ambulatory Visit: Payer: Self-pay | Admitting: Adult Health

## 2016-09-07 ENCOUNTER — Encounter: Payer: Self-pay | Admitting: Obstetrics & Gynecology

## 2016-09-07 ENCOUNTER — Ambulatory Visit (INDEPENDENT_AMBULATORY_CARE_PROVIDER_SITE_OTHER): Payer: Medicaid Other | Admitting: Obstetrics & Gynecology

## 2016-09-07 VITALS — BP 124/78 | HR 96 | Wt 152.0 lb

## 2016-09-07 DIAGNOSIS — N938 Other specified abnormal uterine and vaginal bleeding: Secondary | ICD-10-CM

## 2016-09-07 MED ORDER — DESOGESTREL-ETHINYL ESTRADIOL 0.15-30 MG-MCG PO TABS: 1.0000 | ORAL_TABLET | Freq: Every day | ORAL | 11 refills | Status: DC

## 2016-09-07 MED ORDER — MEGESTROL ACETATE 40 MG PO TABS: ORAL_TABLET | ORAL | 3 refills | Status: DC

## 2016-09-07 NOTE — Progress Notes (Signed)
Chief Complaint  Patient presents with  . Vaginal Bleeding    Blood pressure 124/78, pulse 96, weight 152 lb (68.9 kg), last menstrual period 08/29/2016, not currently breastfeeding.  36 y.o. [id number] Patient's last menstrual period was 08/29/2016 (approximate). The current method of family planning is oral progesterone-only contraceptive.  Outpatient Encounter Prescriptions as of 09/07/2016  Medication Sig  . amLODipine (NORVASC) 10 MG tablet TAKE ONE TABLET BY MOUTH DAILY.  Marland Kitchen buPROPion (WELLBUTRIN XL) 300 MG 24 hr tablet Take 300 mg by mouth daily.  . hydrochlorothiazide (HYDRODIURIL) 25 MG tablet TAKE ONE TABLET BY MOUTH DAILY.  Marland Kitchen norethindrone (MICRONOR,CAMILA,ERRIN) 0.35 MG tablet Take 1 tablet (0.35 mg total) by mouth daily. Take 1 a day  . Prenatal Vit-Fe Fumarate-FA (PNV PRENATAL PLUS MULTIVITAMIN) 27-1 MG TABS TAKE 1 TABLET BY MOUTH DAILY AT 12 NOON  . desogestrel-ethinyl estradiol (APRI,EMOQUETTE,SOLIA) 0.15-30 MG-MCG tablet Take 1 tablet by mouth daily.  Marland Kitchen ibuprofen (ADVIL,MOTRIN) 600 MG tablet Take 1 tablet (600 mg total) by mouth every 6 (six) hours. (Patient not taking: Reported on 09/07/2016)  . megestrol (MEGACE) 40 MG tablet 3 tablets a day for 5 days, 2 tablets a day for 5 days then 1 tablet daily  . [DISCONTINUED] Prenatal MV-Min-Fe Fum-FA-DHA (PRENATAL 1) 30-0.975-200 MG CAPS Take 1 capsule by mouth 2 (two) times daily.   No facility-administered encounter medications on file as of 09/07/2016.     Subjective Pt presents stating since she stopped breast feeding about a month ago has had 2 periods like a regular period Lasts 5-7 days Variable flow Some cramping Has not missed any pills and has not been on antibiotics  Objective   Pertinent ROS No burning with urination, frequency or urgency No nausea, vomiting or diarrhea Nor fever chills or other constitutional symptoms   Labs or studies     Impression Diagnoses this Encounter::   ICD-9-CM  ICD-10-CM   1. DUB (dysfunctional uterine bleeding) 626.8 N93.8    on micronor and recently stopped breast feeding    Established relevant diagnosis(es):   Plan/Recommendations: Meds ordered this encounter  Medications  . megestrol (MEGACE) 40 MG tablet    Sig: 3 tablets a day for 5 days, 2 tablets a day for 5 days then 1 tablet daily    Dispense:  45 tablet    Refill:  3  . desogestrel-ethinyl estradiol (APRI,EMOQUETTE,SOLIA) 0.15-30 MG-MCG tablet    Sig: Take 1 tablet by mouth daily.    Dispense:  1 Package    Refill:  11    Labs or Scans Ordered: No orders of the defined types were placed in this encounter.   Management:: Will stabilize the endometrium with megestrol initially then transition over to a traditional OCP  Follow up Return in about 3 months (around 12/08/2016) for yearly.        Face to face time:  15 minutes  Greater than 50% of the visit time was spent in counseling and coordination of care with the patient.  The summary and outline of the counseling and care coordination is summarized in the note above.   All questions were answered.  Past Medical History:  Diagnosis Date  . Anxiety   . Depression   . Essential hypertension    Medication since 04/2015  . History of headache   . Hypertension    chronic, was on Lisinopril prior to preg.  . Pregnant 07/17/2015  . Vaginal itching 08/19/2015  . Yeast infection 08/19/2015  Past Surgical History:  Procedure Laterality Date  . CHOLECYSTECTOMY      OB History    Gravida Para Term Preterm AB Living   3 3 3     3    SAB TAB Ectopic Multiple Live Births         0 3      Allergies  Allergen Reactions  . Zolpidem Tartrate Other (See Comments)    Reaction:  Hallucinations   . Latex Itching and Rash    Social History   Social History  . Marital status: Married    Spouse name: N/A  . Number of children: N/A  . Years of education: N/A   Social History Main Topics  . Smoking status:  Former Smoker    Packs/day: 0.00    Years: 17.00    Types: Cigarettes    Start date: 07/14/1991    Quit date: 07/13/2015  . Smokeless tobacco: Never Used  . Alcohol use No  . Drug use: No  . Sexual activity: Yes    Birth control/ protection: None   Other Topics Concern  . None   Social History Narrative  . None    Family History  Problem Relation Age of Onset  . Diabetes Father   . Heart attack Father     Age 35  . Hypertension Father   . Hypertension Mother   . Hyperlipidemia Mother   . Heart attack Maternal Grandfather   . Heart attack Paternal Grandmother   . Stroke Paternal Grandmother   . Heart attack Paternal Grandfather

## 2016-09-08 ENCOUNTER — Telehealth: Payer: Self-pay | Admitting: Obstetrics & Gynecology

## 2016-09-29 ENCOUNTER — Encounter: Payer: Self-pay | Admitting: Women's Health

## 2016-10-06 IMAGING — CT CT ABD-PELV W/ CM
1 of 5 series · 16 of 46 positions shown, 18 images · non-contrast
Comparison: No prior chest CT.  CT abdomen and pelvis 02/05/2011.

CLINICAL DATA: 34-year-old with acute onset of mid sternal chest
pain and pressure associated with shortness of breath and
diaphoresis earlier today. Patient states she had sent intermittent
chest and epigastric abdominal pain yesterday. Current history of
anxiety and depression.

EXAM:
CT CHEST, ABDOMEN AND PELVIS WITHOUT CONTRAST
TECHNIQUE: Multidetector CT imaging of the chest, abdomen and pelvis was
performed following the standard protocol without IV contrast.

[Series 201: cap with, idose (2) · axial · 0.83mm/px · z∈[-81,+479]mm · 16 of 126 slices shown, 18 images]
[im 7/126  soft-tissue]
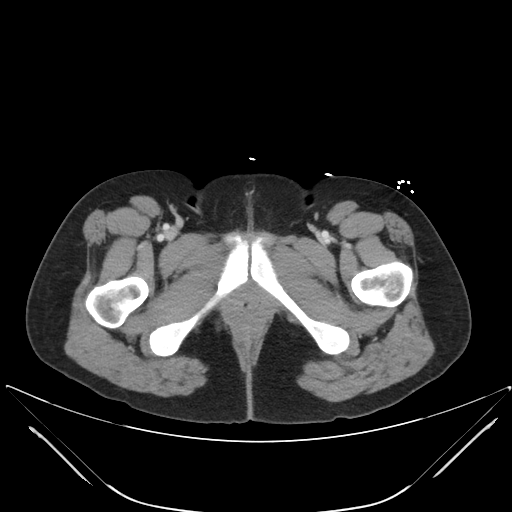
[im 7/126  bone]
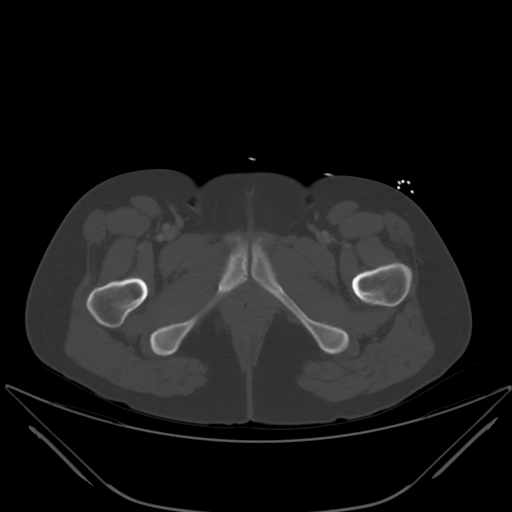
[im 14/126  soft-tissue]
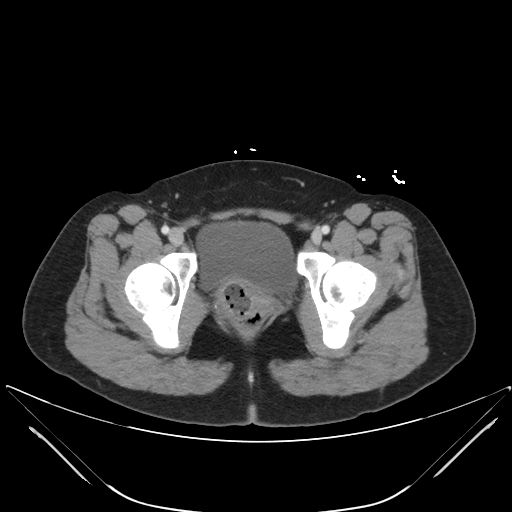
[im 21/126  soft-tissue]
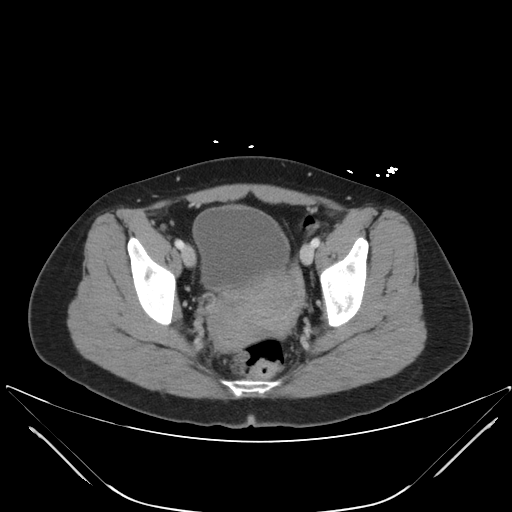
[im 28/126  soft-tissue]
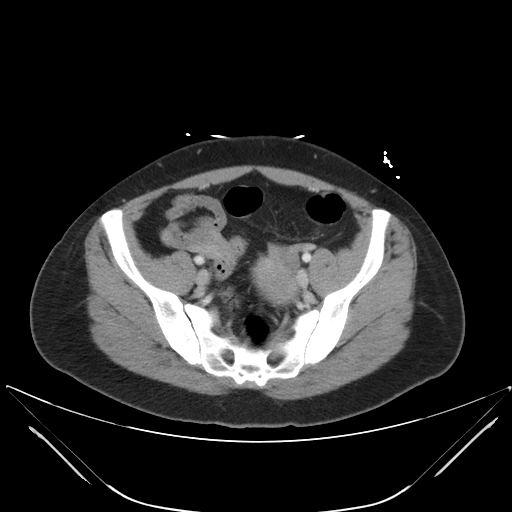
[im 35/126  soft-tissue]
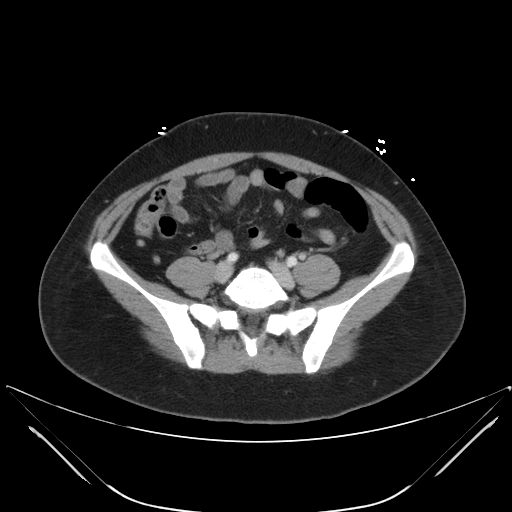
[im 42/126  soft-tissue]
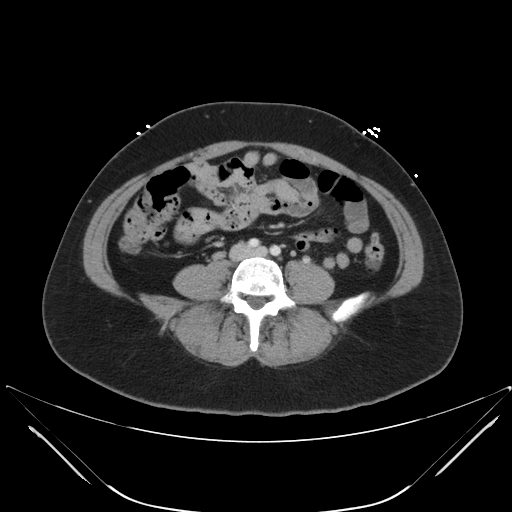
[im 49/126  soft-tissue]
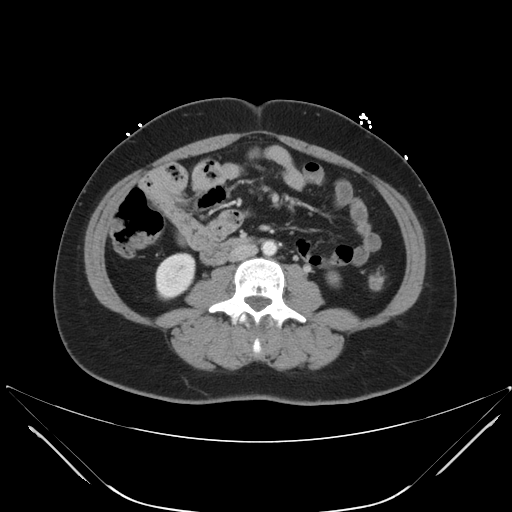
[im 56/126  soft-tissue]
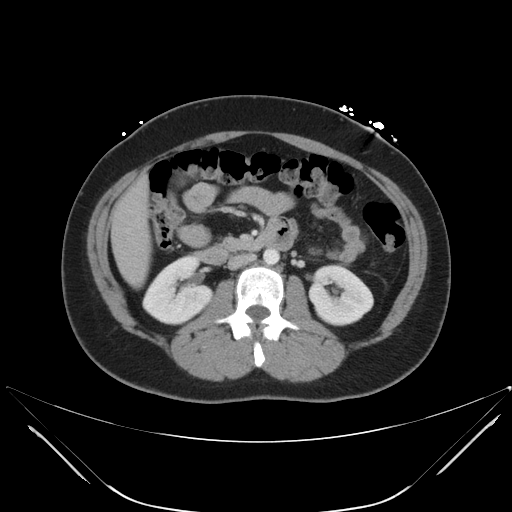
[im 70/126  soft-tissue]
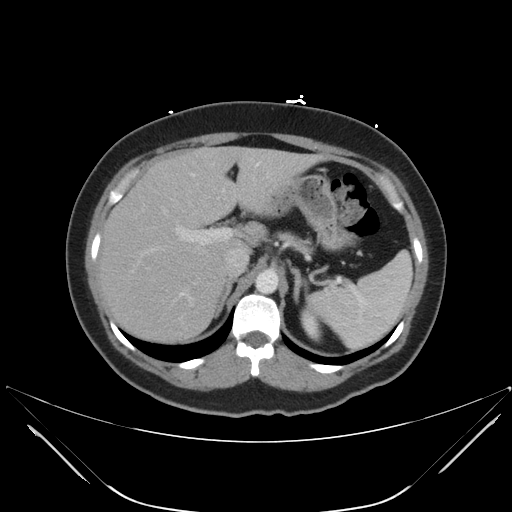
[im 70/126  bone]
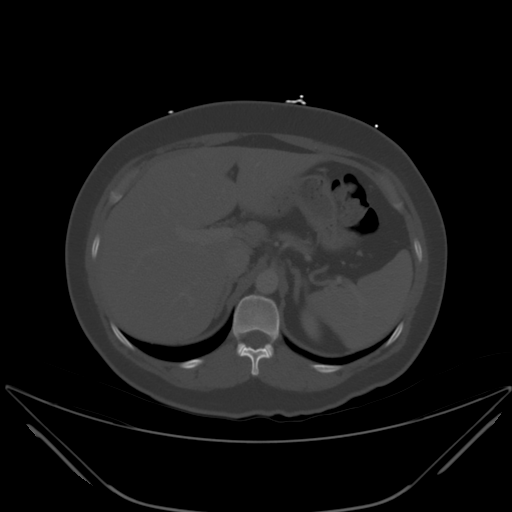
[im 77/126  soft-tissue]
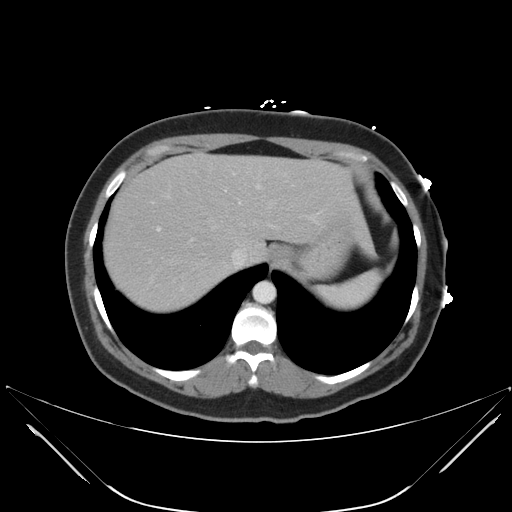
[im 84/126  soft-tissue]
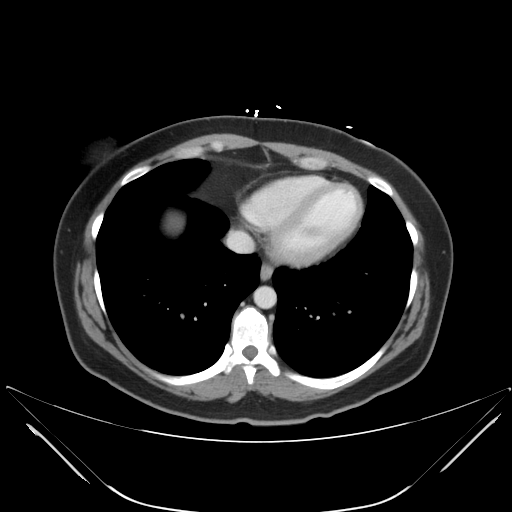
[im 91/126  soft-tissue]
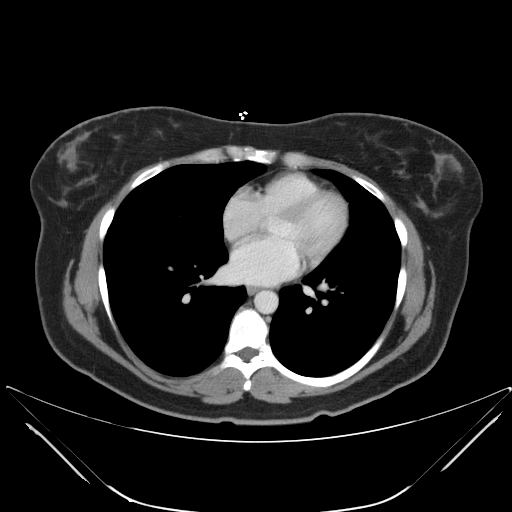
[im 98/126  soft-tissue]
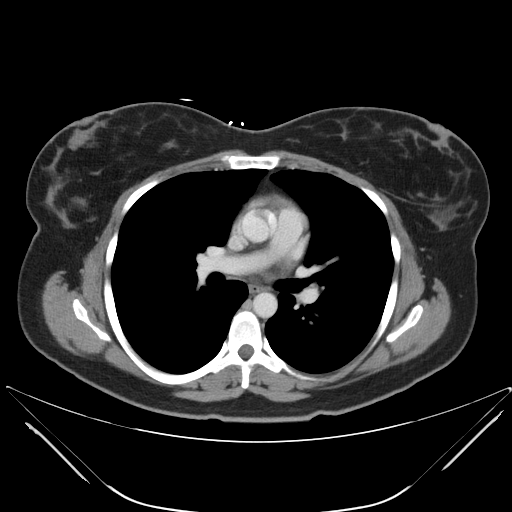
[im 105/126  soft-tissue]
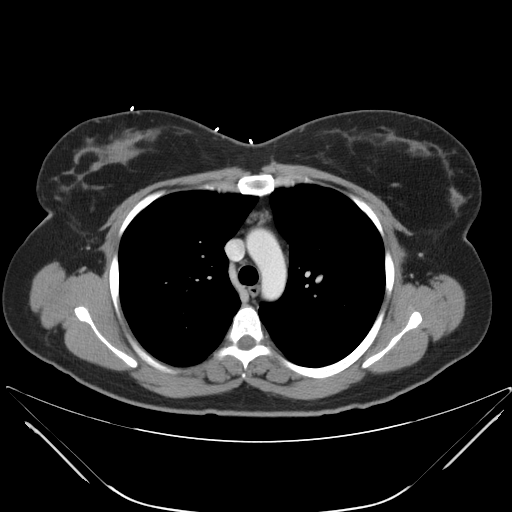
[im 112/126  soft-tissue]
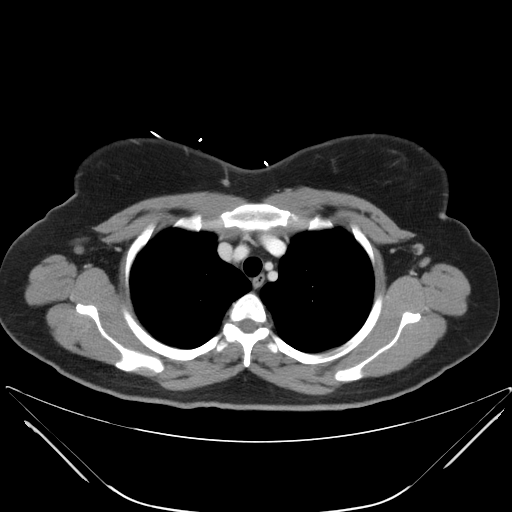
[im 119/126  soft-tissue]
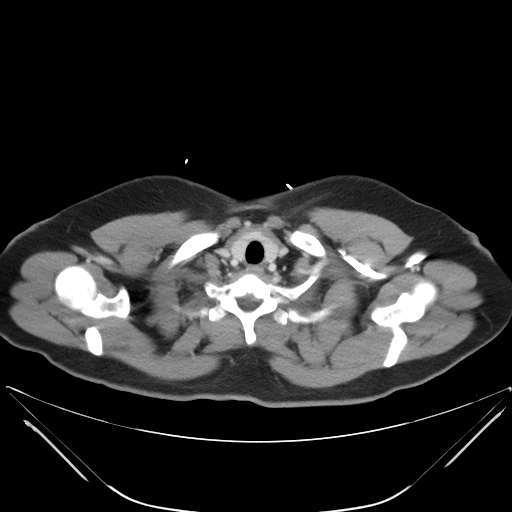

[16 of 46 positions shown; findings below may reference images not displayed]

FINDINGS: CT CHEST FINDINGS

Mediastinum/Lymph Nodes: Residual thymic tissue in the anterior
superior mediastinum. No pathologic mediastinal, hilar or axillary
lymphadenopathy. Normal heart size. No visible coronary
atherosclerosis. Normal appearing thoracic aorta and proximal great
vessels.

Lungs/Pleura: Pulmonary parenchyma clear without localized airspace
consolidation, interstitial disease, or parenchymal nodules or
masses. Central airways patent without significant bronchial wall
thickening. No pleural effusions.

Musculoskeletal: Degenerative disc disease and spondylosis with
endplate sclerosis at T7-8, T8-9, T9-10 and T10-11. Ankylosis of the
sternal body and sternal manubrium.

Other: Normal appearing thyroid gland.

CT ABDOMEN PELVIS FINDINGS

Hepatobiliary: Very small (less than 5 mm) cyst in the medial
segment left lobe of liver, unchanged. No new or significant
abnormality involving the liver. Gallbladder contracted (as there is
food in the stomach) but normal in appearance. No biliary ductal
dilation.

Pancreas: Normal.

Spleen: Normal.

Adrenals/Urinary Tract: Normal adrenal glands. Normal kidneys. No
urinary tract calculi. Normal-appearing urinary bladder.

Stomach/Bowel: Stomach normal in appearance for degree of
distention. Normal-appearing small bowel. Moderate stool burden
throughout normal appearing colon. Normal appendix in the right
upper pelvis.

Vascular/Lymphatic: No visible aortoiliofemoral atherosclerosis.
Widely patent visceral arteries. No pathologic lymphadenopathy in
the abdomen or pelvis.

Reproductive: Tampon within the vagina. Normal-appearing uterus.
Enlarged right ovary measuring approximately 4.0 x 2.7 x 2.6 cm,
increased in size since 8658, without evidence of simple ovarian
cysts. Normal-appearing left ovary.

Other: No ascites.  Very small umbilical hernia containing fat.

Musculoskeletal: Regional skeleton intact.
IMPRESSION: 1.  No acute cardiopulmonary disease.
2. No acute abnormalities involving the abdomen or pelvis.
3. Enlarged right ovary, a new finding since 8658, without evidence
of simple cysts. Transabdominal and transvaginal pelvic ultrasound
may be helpful in further evaluation, and this examination could be
performed as an outpatient.

## 2016-10-06 IMAGING — DX DG CHEST 2V
2 series · 2 of 2 positions shown · non-contrast
Comparison: 09/26/2014

CLINICAL DATA: acute mid sternal chest pain , shortness of breath

EXAM:
CHEST  2 VIEW

[w chest pa]
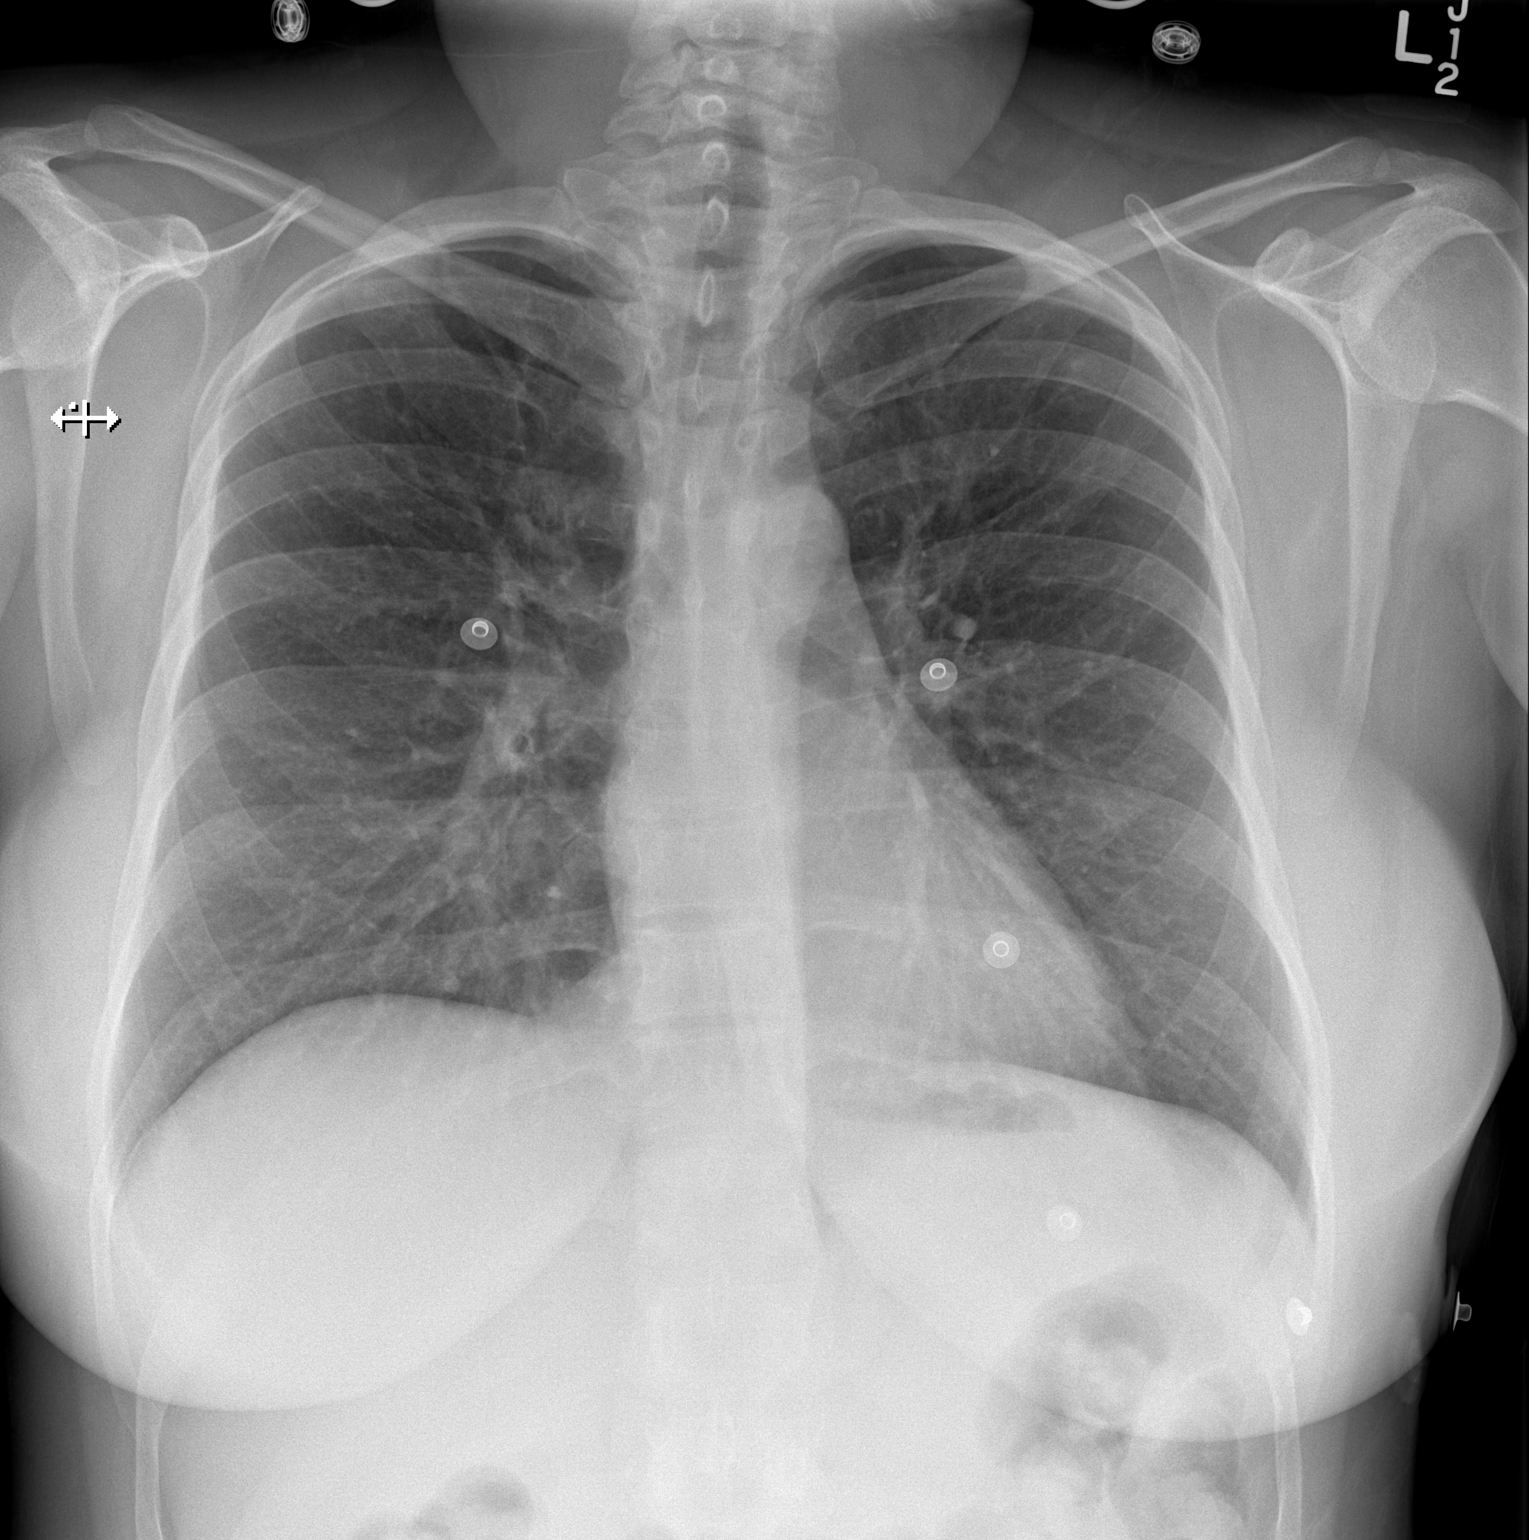

[w chest lat]
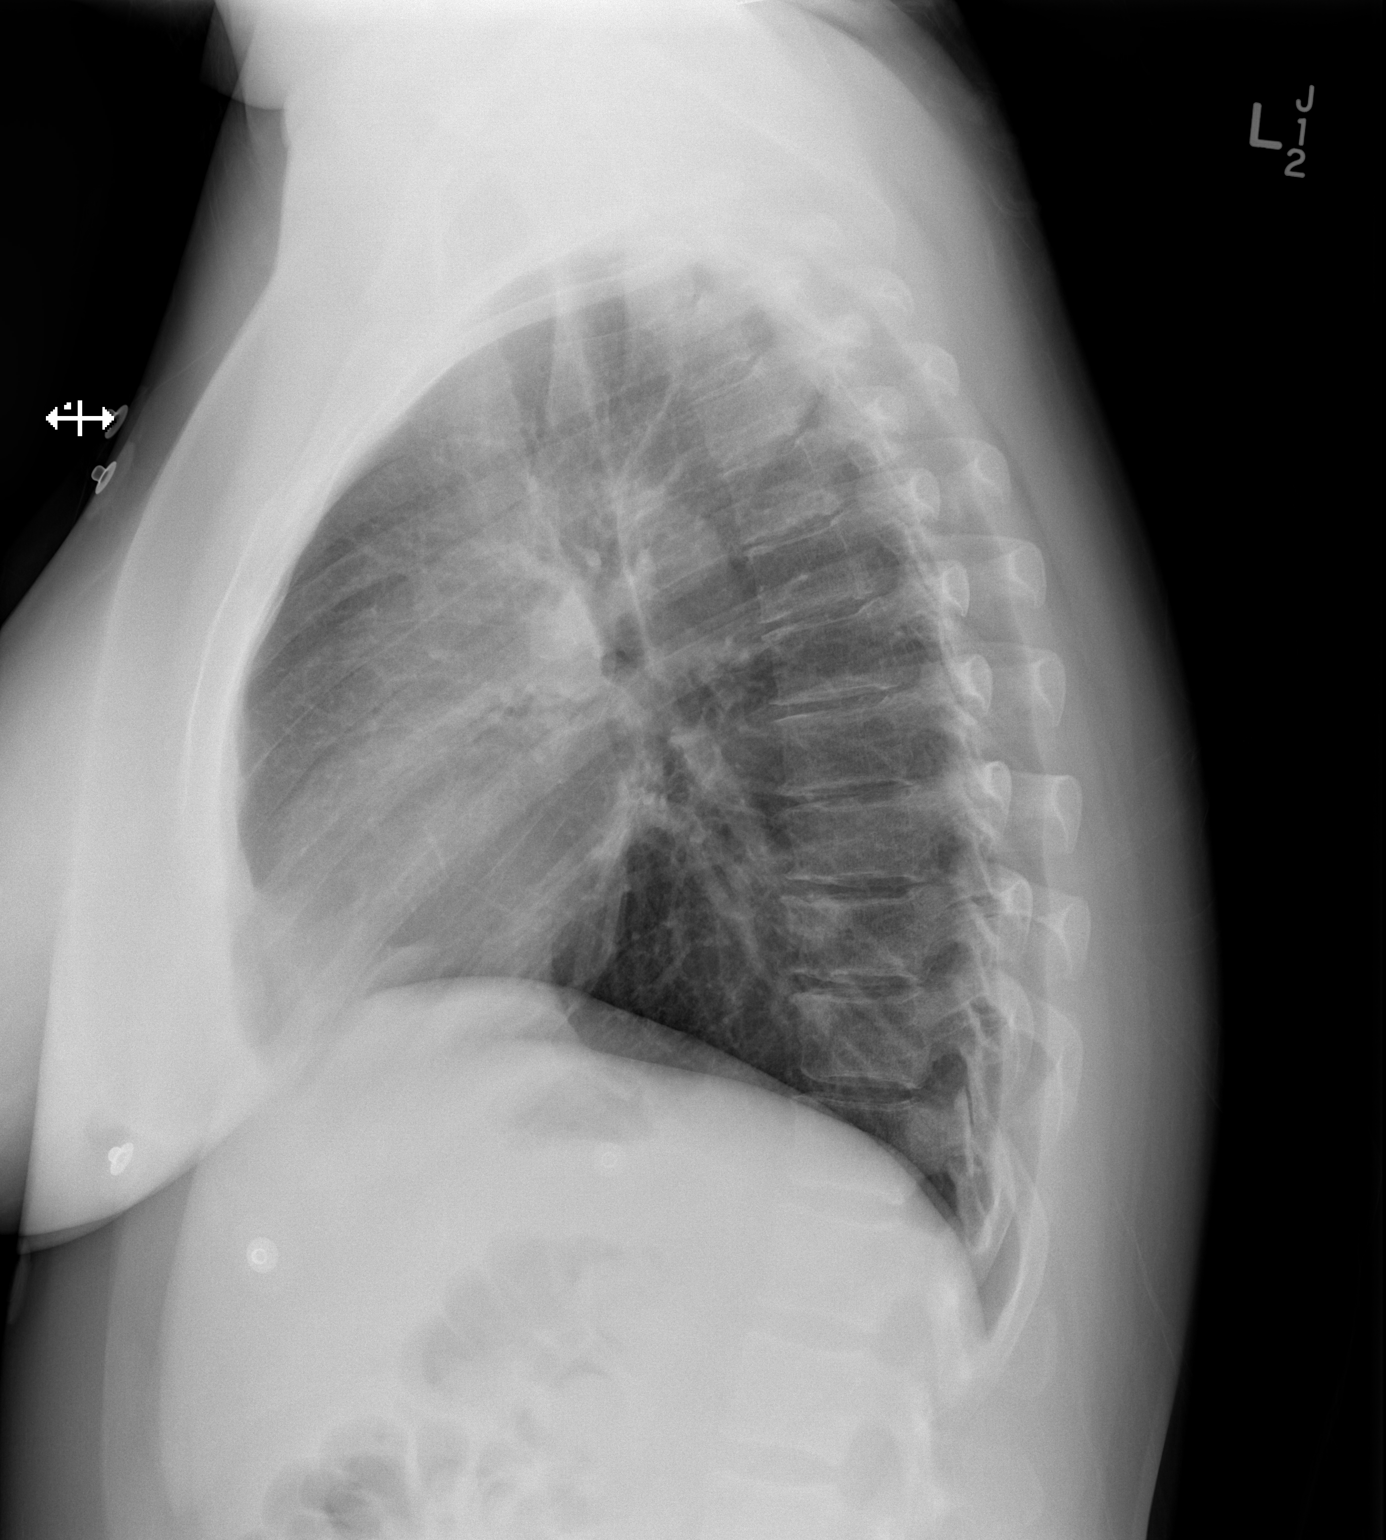

[2 of 2 positions shown; findings below may reference images not displayed]

FINDINGS: The heart size and mediastinal contours are within normal limits.
Both lungs are clear. The visualized skeletal structures are
unremarkable. Artifact overlies the chest.
IMPRESSION: No active cardiopulmonary disease.

## 2016-10-08 ENCOUNTER — Ambulatory Visit: Payer: Medicaid Other | Admitting: Women's Health

## 2016-10-13 ENCOUNTER — Encounter: Payer: Self-pay | Admitting: Women's Health

## 2016-10-13 ENCOUNTER — Ambulatory Visit (INDEPENDENT_AMBULATORY_CARE_PROVIDER_SITE_OTHER): Payer: Medicaid Other | Admitting: Women's Health

## 2016-10-13 VITALS — BP 126/80 | HR 64 | Ht 60.0 in | Wt 150.5 lb

## 2016-10-13 DIAGNOSIS — F418 Other specified anxiety disorders: Secondary | ICD-10-CM | POA: Diagnosis not present

## 2016-10-13 MED ORDER — ESCITALOPRAM OXALATE 10 MG PO TABS: 10.0000 mg | ORAL_TABLET | Freq: Every day | ORAL | 6 refills | Status: DC

## 2016-10-13 NOTE — Progress Notes (Signed)
   [facility] Visit  Patient name: Marissa Reese MRN [id number]  Date of birth: 1980/06/22  CC & HPI:  Marissa Reese is a 36 y.o. [id number] Caucasian female presenting today for report of depression. Is on wellbutrin  daily and has been for ~2.5-62yrs. For last 2 months she has become increasing emotional, cries all the time, can't stay focused. Denies SI/HI/II. Not in counseling/therapy and prefers not to be. Does not have a PCP. Does not want to change meds- states last time that happened everything got much worse, wants to try adding a med to wellbutrin.  Patient's last menstrual period was 09/04/2016 (approximate). The current method of family planning is OCP (estrogen/progesterone).  Pertinent History Reviewed:  Medical & Surgical Hx:   Past medical, surgical, family, and social history reviewed in electronic medical record Medications: Reviewed & Updated - see associated section Allergies: Reviewed in electronic medical record  Objective Findings:  Vitals: BP 126/80 (BP Location: Left Arm, Patient Position: Sitting, Cuff Size: Normal)   Pulse 64   Ht 5' (1.524 m)   Wt 150 lb 8 oz (68.3 kg)   LMP 09/04/2016 (Approximate)   Breastfeeding? No   BMI 29.39 kg/m  Body mass index is 29.39 kg/m.  Physical Examination: General appearance - alert, well appearing, and in no distress  No results found for this or any previous visit (from the past 24 hour(s)).   Assessment & Plan:  A:   Depression/anxiety  P:  Discussed w/ [facility], continue wellbutrin, add lexapro  daily- rx sent- understands it can take a few weeks to notice improvement  Has appt for pap & physical w/ [facility] on 12/10/16, if not feeling better in 4wks- call us to make appt  Return for As scheduled.  Marge Duncans CNM, [facility] 10/13/2016 12:28 PM

## 2016-10-13 NOTE — Patient Instructions (Signed)
Call us in 4 [place]eks if you are not starting to see an improvement, otherwise, follow-up with Dr. Despina Hidden on 12/10/16 as scheduled

## 2016-10-15 ENCOUNTER — Ambulatory Visit: Payer: Medicaid Other | Admitting: Women's Health

## 2016-12-10 ENCOUNTER — Other Ambulatory Visit: Payer: Medicaid Other | Admitting: Obstetrics & Gynecology

## 2016-12-31 ENCOUNTER — Encounter: Payer: Self-pay | Admitting: Obstetrics & Gynecology

## 2016-12-31 ENCOUNTER — Other Ambulatory Visit: Payer: Medicaid Other | Admitting: Obstetrics & Gynecology

## 2017-02-11 ENCOUNTER — Ambulatory Visit (INDEPENDENT_AMBULATORY_CARE_PROVIDER_SITE_OTHER): Payer: Medicaid Other | Admitting: Obstetrics & Gynecology

## 2017-02-11 ENCOUNTER — Encounter: Payer: Self-pay | Admitting: Obstetrics & Gynecology

## 2017-02-11 ENCOUNTER — Other Ambulatory Visit (HOSPITAL_COMMUNITY)
Admission: RE | Admit: 2017-02-11 | Discharge: 2017-02-11 | Disposition: A | Discharge status: Home / Self Care | Payer: Medicaid Other | Source: Ambulatory Visit | Attending: Obstetrics & Gynecology | Admitting: Obstetrics & Gynecology

## 2017-02-11 VITALS — BP 132/86 | HR 103 | Ht 60.0 in | Wt 154.0 lb

## 2017-02-11 DIAGNOSIS — Z0000 Encounter for general adult medical examination without abnormal findings: Secondary | ICD-10-CM | POA: Diagnosis not present

## 2017-02-11 DIAGNOSIS — Z124 Encounter for screening for malignant neoplasm of cervix: Secondary | ICD-10-CM | POA: Insufficient documentation

## 2017-02-11 DIAGNOSIS — Z01419 Encounter for gynecological examination (general) (routine) without abnormal findings: Secondary | ICD-10-CM

## 2017-02-11 MED ORDER — NORGESTIMATE-ETH ESTRADIOL 0.25-35 MG-MCG PO TABS: 1.0000 | ORAL_TABLET | Freq: Every day | ORAL | 11 refills | Status: DC

## 2017-02-11 NOTE — Progress Notes (Signed)
Subjective:     Marissa Reese is a 36 y.o. female here for a routine exam.  No LMP recorded. Patient is not currently having periods (Reason: Oral contraceptives). [id number] Birth Control Method:  OCP Menstrual Calendar(currently): irregular  Current complaints: BTB.   Current acute medical issues:     Recent Gynecologic History No LMP recorded. Patient is not currently having periods (Reason: Oral contraceptives). Last Pap: 2017,  normal Last mammogram: ,    Past Medical History:  Diagnosis Date  . Anxiety   . Depression   . Essential hypertension    Medication since 04/2015  . History of headache   . Hypertension    chronic, was on Lisinopril prior to preg.  . Pregnant 07/17/2015  . Vaginal itching 08/19/2015  . Yeast infection 08/19/2015    Past Surgical History:  Procedure Laterality Date  . CHOLECYSTECTOMY      OB History    Gravida Para Term Preterm AB Living   3 3 3     3    SAB TAB Ectopic Multiple Live Births         0 3      Social History   Social History  . Marital status: Married    Spouse name: N/A  . Number of children: N/A  . Years of education: N/A   Social History Main Topics  . Smoking status: Former Smoker    Packs/day: 0.00    Years: 17.00    Types: Cigarettes    Start date: 07/14/1991    Quit date: 07/13/2015  . Smokeless tobacco: Never Used  . Alcohol use No  . Drug use: No  . Sexual activity: Yes    Birth control/ protection: Pill   Other Topics Concern  . None   Social History Narrative  . None    Family History  Problem Relation Age of Onset  . Diabetes Father   . Heart attack Father        Age 66  . Hypertension Father   . Hypertension Mother   . Hyperlipidemia Mother   . Heart attack Maternal Grandfather   . Heart attack Paternal Grandmother   . Stroke Paternal Grandmother   . Heart attack Paternal Grandfather      Current Outpatient Prescriptions:  .  buPROPion (WELLBUTRIN XL) 300 MG 24 hr tablet, Take 300  mg by mouth daily., Disp: , Rfl:  .  escitalopram (LEXAPRO) 10 MG tablet, Take 1 tablet (10 mg total) by mouth daily., Disp: 30 tablet, Rfl: 6 .  hydrochlorothiazide (HYDRODIURIL) 25 MG tablet, TAKE ONE TABLET BY MOUTH DAILY., Disp: 30 tablet, Rfl: 11 .  megestrol (MEGACE) 40 MG tablet, 3 tablets a day for 5 days, 2 tablets a day for 5 days then 1 tablet daily, Disp: 45 tablet, Rfl: 3 .  amLODipine (NORVASC) 10 MG tablet, TAKE ONE TABLET BY MOUTH DAILY. (Patient not taking: Reported on 02/11/2017), Disp: 30 tablet, Rfl: 11 .  ibuprofen (ADVIL,MOTRIN) 600 MG tablet, Take 1 tablet (600 mg total) by mouth every 6 (six) hours. (Patient not taking: Reported on 02/11/2017), Disp: 30 tablet, Rfl: 0 .  norgestimate-ethinyl estradiol (ORTHO-CYCLEN, 28,) 0.25-35 MG-MCG tablet, Take 1 tablet by mouth daily., Disp: 1 Package, Rfl: 11 .  Prenatal Vit-Fe Fumarate-FA (PNV PRENATAL PLUS MULTIVITAMIN) 27-1 MG TABS, TAKE 1 TABLET BY MOUTH DAILY AT 12 NOON (Patient not taking: Reported on 02/11/2017), Disp: 30 tablet, Rfl: prn  Review of Systems  Review of Systems  Constitutional: Negative for fever,  chills, weight loss, malaise/fatigue and diaphoresis.  HENT: Negative for hearing loss, ear pain, nosebleeds, congestion, sore throat, neck pain, tinnitus and ear discharge.   Eyes: Negative for blurred vision, double vision, photophobia, pain, discharge and redness.  Respiratory: Negative for cough, hemoptysis, sputum production, shortness of breath, wheezing and stridor.   Cardiovascular: Negative for chest pain, palpitations, orthopnea, claudication, leg swelling and PND.  Gastrointestinal: negative for abdominal pain. Negative for heartburn, nausea, vomiting, diarrhea, constipation, blood in stool and melena.  Genitourinary: Negative for dysuria, urgency, frequency, hematuria and flank pain.  Musculoskeletal: Negative for myalgias, back pain, joint pain and falls.  Skin: Negative for itching and rash.  Neurological:  Negative for dizziness, tingling, tremors, sensory change, speech change, focal weakness, seizures, loss of consciousness, weakness and headaches.  Endo/Heme/Allergies: Negative for environmental allergies and polydipsia. Does not bruise/bleed easily.  Psychiatric/Behavioral: Negative for depression, suicidal ideas, hallucinations, memory loss and substance abuse. The patient is not nervous/anxious and does not have insomnia.        Objective:  Blood pressure 132/86, pulse (!) 103, height 5' (1.524 m), weight 154 lb (69.9 kg), not currently breastfeeding.   Physical Exam  Vitals reviewed. Constitutional: She is oriented to person, place, and time. She appears well-developed and well-nourished.  HENT:  Head: Normocephalic and atraumatic.        Right Ear: External ear normal.  Left Ear: External ear normal.  Nose: Nose normal.  Mouth/Throat: Oropharynx is clear and moist.  Eyes: Conjunctivae and EOM are normal. Pupils are equal, round, and reactive to light. Right eye exhibits no discharge. Left eye exhibits no discharge. No scleral icterus.  Neck: Normal range of motion. Neck supple. No tracheal deviation present. No thyromegaly present.  Cardiovascular: Normal rate, regular rhythm, normal heart sounds and intact distal pulses.  Exam reveals no gallop and no friction rub.   No murmur heard. Respiratory: Effort normal and breath sounds normal. No respiratory distress. She has no wheezes. She has no rales. She exhibits no tenderness.  GI: Soft. Bowel sounds are normal. She exhibits no distension and no mass. There is no tenderness. There is no rebound and no guarding.  Genitourinary:  Breasts no masses skin changes or nipple changes bilaterally      Vulva is normal without lesions Vagina is pink moist without discharge Cervix normal in appearance and pap is done Uterus is normal size shape and contour Adnexa is negative with normal sized ovaries   Musculoskeletal: Normal range of motion.  She exhibits no edema and no tenderness.  Neurological: She is alert and oriented to person, place, and time. She has normal reflexes. She displays normal reflexes. No cranial nerve deficit. She exhibits normal muscle tone. Coordination normal.  Skin: Skin is warm and dry. No rash noted. No erythema. No pallor.  Psychiatric: She has a normal mood and affect. Her behavior is normal. Judgment and thought content normal.       Medications Ordered at today's visit: Meds ordered this encounter  Medications  . norgestimate-ethinyl estradiol (ORTHO-CYCLEN, 28,) 0.25-35 MG-MCG tablet    Sig: Take 1 tablet by mouth daily.    Dispense:  1 Package    Refill:  11    Other orders placed at today's visit: No orders of the defined types were placed in this encounter.     Assessment:    Healthy female exam.    Plan:    Contraception: OCP (estrogen/progesterone). Follow up in: 1 year.     Return in about  1 year (around 02/11/2018) for yearly, with Dr Despina Hidden.

## 2017-02-15 ENCOUNTER — Other Ambulatory Visit: Payer: Self-pay | Admitting: Obstetrics & Gynecology

## 2017-02-17 LAB — CYTOLOGY - PAP
DIAGNOSIS: NEGATIVE
HPV (WINDOPATH): NOT DETECTED

## 2017-05-14 ENCOUNTER — Other Ambulatory Visit: Payer: Self-pay | Admitting: Obstetrics & Gynecology

## 2017-05-14 ENCOUNTER — Other Ambulatory Visit: Payer: Self-pay | Admitting: Women's Health

## 2017-11-03 ENCOUNTER — Other Ambulatory Visit: Payer: Self-pay

## 2017-11-03 ENCOUNTER — Emergency Department (HOSPITAL_COMMUNITY): Payer: PRIVATE HEALTH INSURANCE

## 2017-11-03 ENCOUNTER — Emergency Department (HOSPITAL_COMMUNITY)
Admission: EM | Admit: 2017-11-03 | Discharge: 2017-11-03 | Disposition: A | Discharge status: Home / Self Care | Payer: PRIVATE HEALTH INSURANCE | Attending: Emergency Medicine | Admitting: Emergency Medicine

## 2017-11-03 ENCOUNTER — Encounter (HOSPITAL_COMMUNITY): Payer: Self-pay | Admitting: Emergency Medicine

## 2017-11-03 DIAGNOSIS — I10 Essential (primary) hypertension: Secondary | ICD-10-CM | POA: Diagnosis not present

## 2017-11-03 DIAGNOSIS — Z87891 Personal history of nicotine dependence: Secondary | ICD-10-CM | POA: Insufficient documentation

## 2017-11-03 DIAGNOSIS — Z79899 Other long term (current) drug therapy: Secondary | ICD-10-CM | POA: Diagnosis not present

## 2017-11-03 DIAGNOSIS — R51 Headache: Secondary | ICD-10-CM | POA: Diagnosis present

## 2017-11-03 DIAGNOSIS — G910 Communicating hydrocephalus: Secondary | ICD-10-CM | POA: Diagnosis not present

## 2017-11-03 DIAGNOSIS — N390 Urinary tract infection, site not specified: Secondary | ICD-10-CM | POA: Insufficient documentation

## 2017-11-03 DIAGNOSIS — Z91040 Latex allergy status: Secondary | ICD-10-CM | POA: Insufficient documentation

## 2017-11-03 DIAGNOSIS — E876 Hypokalemia: Secondary | ICD-10-CM | POA: Diagnosis not present

## 2017-11-03 LAB — URINALYSIS, ROUTINE W REFLEX MICROSCOPIC
BILIRUBIN URINE: NEGATIVE
Glucose, UA: NEGATIVE mg/dL
KETONES UR: 20 mg/dL — AB
Nitrite: NEGATIVE
Protein, ur: 30 mg/dL — AB
Specific Gravity, Urine: 1.026 (ref 1.005–1.030)
pH: 5 (ref 5.0–8.0)

## 2017-11-03 LAB — BASIC METABOLIC PANEL
ANION GAP: 10 (ref 5–15)
BUN: 8 mg/dL (ref 6–20)
CALCIUM: 8.9 mg/dL (ref 8.9–10.3)
CO2: 28 mmol/L (ref 22–32)
Chloride: 102 mmol/L (ref 101–111)
Creatinine, Ser: 0.78 mg/dL (ref 0.44–1.00)
GFR calc Af Amer: >60 mL/min (ref >60)
GLUCOSE: 85 mg/dL (ref 65–99)
POTASSIUM: 3 mmol/L — AB (ref 3.5–5.1)
SODIUM: 140 mmol/L (ref 135–145)

## 2017-11-03 LAB — CBC
HEMATOCRIT: 41.8 % (ref 36.0–46.0)
Hemoglobin: 13.4 g/dL (ref 12.0–15.0)
MCH: 29.3 pg (ref 26.0–34.0)
MCHC: 32.1 g/dL (ref 30.0–36.0)
MCV: 91.5 fL (ref 78.0–100.0)
Platelets: 329 10*3/uL (ref 150–400)
RBC: 4.57 MIL/uL (ref 3.87–5.11)
RDW: 13.2 % (ref 11.5–15.5)
WBC: 7.2 10*3/uL (ref 4.0–10.5)

## 2017-11-03 LAB — CBG MONITORING, ED: Glucose-Capillary: 93 mg/dL (ref 65–99)

## 2017-11-03 LAB — I-STAT BETA HCG BLOOD, ED (MC, WL, AP ONLY): I-stat hCG, quantitative: <5 m[IU]/mL (ref <5)

## 2017-11-03 LAB — CK: Total CK: 68 U/L (ref 38–234)

## 2017-11-03 MED ORDER — POTASSIUM CHLORIDE 10 MEQ/100ML IV SOLN
10.0000 meq | Freq: Once | INTRAVENOUS | Status: AC
  Administered 2017-11-03: 10 meq via INTRAVENOUS
  Filled 2017-11-03: qty 100

## 2017-11-03 MED ORDER — CEPHALEXIN 500 MG PO CAPS
500.0000 mg | ORAL_CAPSULE | Freq: Once | ORAL | Status: AC
  Administered 2017-11-03: 500 mg via ORAL
  Filled 2017-11-03: qty 1

## 2017-11-03 MED ORDER — MAGNESIUM SULFATE 2 GM/50ML IV SOLN
2.0000 g | Freq: Once | INTRAVENOUS | Status: AC
  Administered 2017-11-03: 2 g via INTRAVENOUS
  Filled 2017-11-03: qty 50

## 2017-11-03 MED ORDER — POTASSIUM CHLORIDE CRYS ER 20 MEQ PO TBCR
40.0000 meq | EXTENDED_RELEASE_TABLET | Freq: Once | ORAL | Status: AC
  Administered 2017-11-03: 40 meq via ORAL
  Filled 2017-11-03: qty 2

## 2017-11-03 MED ORDER — POTASSIUM CHLORIDE CRYS ER 20 MEQ PO TBCR: 40.0000 meq | EXTENDED_RELEASE_TABLET | Freq: Two times a day (BID) | ORAL | 0 refills | Status: DC

## 2017-11-03 MED ORDER — CEPHALEXIN 500 MG PO CAPS: ORAL_CAPSULE | ORAL | 0 refills | Status: DC

## 2017-11-03 NOTE — ED Notes (Signed)
Advised patient we needed urine specimen. 

## 2017-11-03 NOTE — ED Triage Notes (Signed)
Patient sent by Urgent Care for evaluation. Patient reports feeling like "her brain is loose." Denies dizziness, reports weakness. Removed a tick from her back on Monday, had not embedded. Patient reports weakness that started on Tuesday. Nauseated in waves since Tuesday. Patient reports tingling in her L calf since Tuesday. Stroke screen negative.

## 2017-11-03 NOTE — ED Provider Notes (Signed)
Emergency Department Provider Note   I have reviewed the triage vital signs and the nursing notes.  HISTORY  Chief Complaint Tingling   HPI Marissa Reese is a 37 y.o. female with the medical problems as documented below that presents to the emergency department today secondary to headache and tingling.  Patient states that she had a tick on her for unknown period of time that she found on Monday and then Tuesday she started feeling general malaise, nausea, sleepiness and joint aches.  She started having a headache and some left leg tingling and cramping.  She describes a headache as "feels like my brain is loose and bouncing around".  States that her legs tingling cramps similar to this when she has fevers however she has not had one with this.  No cough, runny nose or urinary symptoms.  No other GI symptoms.  No rash elsewhere.  She went to urgent care who sent her here for further evaluation. No focal weakness.  No other associated or modifying symptoms.    Past Medical History:  Diagnosis Date  . Anxiety   . Depression   . Essential hypertension    Medication since 04/2015  . History of headache   . Hypertension    chronic, was on Lisinopril prior to preg.  . Pregnant 07/17/2015  . Vaginal itching 08/19/2015  . Yeast infection 08/19/2015    Patient Active Problem List   Diagnosis Date Noted  . Mild pre-eclampsia, postpartum 03/14/2016  . Yeast infection 08/19/2015  . Depression with anxiety 08/12/2015  . Chronic hypertension 08/12/2015  . Smoker 08/12/2015    Past Surgical History:  Procedure Laterality Date  . CHOLECYSTECTOMY      Current Outpatient Rx  . Order #: [id number] Class: Historical Med  . Order #: [id number] Class: Normal  . Order #: [id number] Class: Normal  . Order #: [id number] Class: Normal  . Order #: [id number] Class: Historical Med  . Order #: [id number] Class: Print  . Order #: [id number] Class: Normal  . Order #: [id number] Class: Print     Allergies Zolpidem tartrate and Latex  Family History  Problem Relation Age of Onset  . Diabetes Father   . Heart attack Father        Age 38  . Hypertension Father   . Hypertension Mother   . Hyperlipidemia Mother   . Heart attack Maternal Grandfather   . Heart attack Paternal Grandmother   . Stroke Paternal Grandmother   . Heart attack Paternal Grandfather     Social History Social History   Tobacco Use  . Smoking status: Former Smoker    Packs/day: 0.00    Years: 17.00    Pack years: 0.00    Types: Cigarettes    Start date: 07/14/1991    Last attempt to quit: 07/13/2015    Years since quitting: 2.3  . Smokeless tobacco: Never Used  Substance Use Topics  . Alcohol use: No    Alcohol/week: 0.0 oz  . Drug use: No    Review of Systems  All other systems negative except as documented in the HPI. All pertinent positives and negatives as reviewed in the HPI. ____________________________________________   PHYSICAL EXAM:  VITAL SIGNS: Vitals:   11/03/17 1930 11/03/17 2125  BP: 112/64 (!) 115/55  Pulse: 81 90  Resp: 14 16  Temp:    SpO2: 98% 98%    Constitutional: Alert and oriented. Well appearing and in no acute distress. Eyes: Conjunctivae are normal. PERRL. EOMI. Head: Atraumatic. Nose: No congestion/rhinnorhea. Mouth/Throat: Mucous  membranes are moist.  Oropharynx non-erythematous. Neck: No stridor.  No meningeal signs.   Cardiovascular: Normal rate, regular rhythm. Good peripheral circulation. Grossly normal heart sounds.   Respiratory: Normal respiratory effort.  No retractions. Lungs CTAB. Gastrointestinal: Soft and nontender. No distention.  Musculoskeletal: No lower extremity tenderness nor edema. No gross deformities of extremities. Neurologic:  Normal speech and language. No gross focal neurologic deficits are appreciated.  Skin:  Skin is warm, dry and intact. No rash noted.   ____________________________________________   LABS (all labs  ordered are listed, but only abnormal results are displayed)  Labs Reviewed  BASIC METABOLIC PANEL - Abnormal; Notable for the following components:      Result Value   Potassium 3.0 (*)    All other components within normal limits  URINALYSIS, ROUTINE W REFLEX MICROSCOPIC - Abnormal; Notable for the following components:   Color, Urine AMBER (*)    APPearance CLOUDY (*)    Hgb urine dipstick SMALL (*)    Ketones, ur 20 (*)    Protein, ur 30 (*)    Leukocytes, UA MODERATE (*)    WBC, UA >50 (*)    Bacteria, UA RARE (*)    Squamous Epithelial / LPF >50 (*)    Non Squamous Epithelial 0-5 (*)    All other components within normal limits  URINE CULTURE  CBC  CK  CBG MONITORING, ED  I-STAT BETA HCG BLOOD, ED ([facility], [facility], [facility] ONLY)   ____________________________________________  EKG   EKG Interpretation  Date/Time:  Thursday Nov 03 2017 16:20:45 EDT Ventricular Rate:  93 PR Interval:    QRS Duration: 89 QT Interval:  379 QTC Calculation: 472 R Axis:   13 Text Interpretation:  Sinus rhythm Low voltage, extremity and precordial leads No significant change since last tracing Confirmed by Marily Memos [phone]) on 11/03/2017 5:07:21 PM       ____________________________________________  RADIOLOGY  Ct Head Wo Contrast  Result Date: 11/03/2017 CLINICAL DATA:  37 year old female with acute headache and nausea. EXAM: CT HEAD WITHOUT CONTRAST TECHNIQUE: Contiguous axial images were obtained from the base of the skull through the vertex without intravenous contrast. COMPARISON:  None. FINDINGS: Brain: Prominence of the ventricles is advanced for age. No evidence of acute infarction, hemorrhage, extra-axial collection or mass lesion/mass effect. Vascular: No hyperdense vessel or unexpected calcification. Skull: Normal. Negative for fracture or focal lesion. Sinuses/Orbits: No acute finding. Other: None. IMPRESSION: 1. Prominence of the ventricles, advanced for age. This may be secondary to  advanced central atrophy or a communicating hydrocephalus. 2. No other significant abnormalities. Electronically Signed   By: Harmon Pier M.D.   On: 11/03/2017 18:36    ____________________________________________   PROCEDURES  Procedure(s) performed:   Procedures   ____________________________________________   INITIAL IMPRESSION / ASSESSMENT AND PLAN / ED COURSE  Could be a tick borne illness however she is not sure how long it was stuck.  Could also be symptoms of intracranial normality such as mass so we will get a CT scan.  Also consider metabolic causes so we will check labs and urinalysis.  Will give fluids.  If all is negative we will treat her for possible tickborne illness.  Clinical Course as of Nov 05 1523  Thu Nov 03, 2017  2015 Possibly related to symptoms? Will replenish via IV/PO methods.  Potassium(!): 3.0 [JM]  2016 Hydrocephalus of undetermined etiology. Doubt relation to todays symptoms but will need neurology follow up for same.   CT Head Wo Contrast [  JM]  2016 normal  CBC [JM]  2017 No e/o muscular breakdown or disease.  CK [JM]  2017 Still pending...  Urinalysis, Routine w reflex microscopic [JM]    Clinical Course User Index [JM] Kiona Blume, Barbara Cower, MD   Suspect hypo-K as cause for symptoms. Will treat for same. Likely UTI as well, will treat as well. Doubt hydrocephalus is contributing currently, but will follow up with PCP for further management of same.   Pertinent labs & imaging results that were available during my care of the patient were reviewed by me and considered in my medical decision making (see chart for details).  ____________________________________________  FINAL CLINICAL IMPRESSION(S) / ED DIAGNOSES  Final diagnoses:  Hypokalemia  Communicating hydrocephalus  Urinary tract infection without hematuria, site unspecified     MEDICATIONS GIVEN DURING THIS VISIT:  Medications  potassium chloride SA (K-DUR,KLOR-CON) CR tablet 40  mEq (40 mEq Oral Given 11/03/17 2004)  potassium chloride 10 mEq in 100 mL IVPB (0 mEq Intravenous Stopped 11/03/17 2121)  magnesium sulfate IVPB 2 g 50 mL (0 g Intravenous Stopped 11/03/17 2121)  cephALEXin (KEFLEX) capsule 500 mg (500 mg Oral Given 11/03/17 2125)     NEW OUTPATIENT MEDICATIONS STARTED DURING THIS VISIT:  Discharge Medication List as of 11/03/2017  9:17 PM    START taking these medications   Details  cephALEXin (KEFLEX) 500 MG capsule 2 caps po bid x 7 days, Print    potassium chloride SA (K-DUR,KLOR-CON) 20 MEQ tablet Take 2 tablets (40 mEq total) by mouth 2 (two) times daily., Starting Thu 11/03/2017, Print        Note:  This note was prepared with assistance of Dragon voice recognition software. Occasional wrong-word or sound-a-like substitutions may have occurred due to the inherent limitations of voice recognition software.   Marily Memos, MD 11/04/17 1525

## 2017-11-05 LAB — URINE CULTURE: CULTURE: NO GROWTH

## 2017-11-08 ENCOUNTER — Other Ambulatory Visit (HOSPITAL_COMMUNITY): Payer: Self-pay | Admitting: Adult Health Nurse Practitioner

## 2017-11-08 DIAGNOSIS — G4452 New daily persistent headache (NDPH): Secondary | ICD-10-CM

## 2017-11-08 DIAGNOSIS — G919 Hydrocephalus, unspecified: Secondary | ICD-10-CM

## 2017-11-14 ENCOUNTER — Ambulatory Visit (INDEPENDENT_AMBULATORY_CARE_PROVIDER_SITE_OTHER): Payer: PRIVATE HEALTH INSURANCE | Admitting: Neurology

## 2017-11-14 ENCOUNTER — Encounter: Payer: Self-pay | Admitting: Neurology

## 2017-11-14 VITALS — BP 138/90 | HR 80 | Ht 60.0 in | Wt 163.5 lb

## 2017-11-14 DIAGNOSIS — R5382 Chronic fatigue, unspecified: Secondary | ICD-10-CM | POA: Diagnosis not present

## 2017-11-14 DIAGNOSIS — E876 Hypokalemia: Secondary | ICD-10-CM | POA: Diagnosis not present

## 2017-11-14 DIAGNOSIS — G4489 Other headache syndrome: Secondary | ICD-10-CM | POA: Diagnosis not present

## 2017-11-14 DIAGNOSIS — G479 Sleep disorder, unspecified: Secondary | ICD-10-CM | POA: Diagnosis not present

## 2017-11-14 NOTE — Progress Notes (Signed)
Reason for visit: Abnormal CT brain  Referring physician: Dr. Doroteo Glassman is a 37 y.o. female  History of present illness:  Marissa Reese is a 37 year old right-handed white female with a history of problems with heaviness sensation of the head that began about 2 weeks ago.  The patient has felt somewhat nauseated on a daily basis over the last 2 months.  She is still able to maintain her weight, she is able to eat but she feels queasy all the time. She denies that she is pregnant.  The patient is also noted some problems with feeling general fatigue and excessive daytime drowsiness for at least a year.  This has gradually worsened over time and has affected her ability to function and concentrate during the day.  The patient works in front of a computer and she oftentimes will nod off, she may take naps on the weekend.  She allows for about 10 hours asleep at night but she wakes up 5 or 6 times during the night, and in the morning she feels fatigued as if she got no rest.  She does snore at night.  The patient reports no numbness or weakness of the face, arms, legs.  She denies any balance problems or difficulty controlling the bowels or the bladder.  The patient was treated recently for a urinary tract infection.  The patient has undergone a CT scan of the brain that showed some slight enlargement of the ventricular system, the possibility of communicating hydrocephalus was brought up.  The patient comes to this office for further evaluation.  MRI of the brain has been ordered and will be done tomorrow.  Past Medical History:  Diagnosis Date  . Anxiety   . Depression   . Essential hypertension    Medication since 04/2015  . History of headache   . Hypertension    chronic, was on Lisinopril prior to preg.  . Pregnant 07/17/2015  . Vaginal itching 08/19/2015  . Yeast infection 08/19/2015    Past Surgical History:  Procedure Laterality Date  . CHOLECYSTECTOMY      Family  History  Problem Relation Age of Onset  . Diabetes Father   . Heart attack Father        Age 61  . Hypertension Father   . Hypertension Mother   . Hyperlipidemia Mother   . Heart attack Maternal Grandfather   . Heart attack Paternal Grandmother   . Stroke Paternal Grandmother   . Heart attack Paternal Grandfather     Social history:  reports that she quit smoking about 2 years ago. Her smoking use included cigarettes. She started smoking about 26 years ago. She smoked 0.00 packs per day for 17.00 years. She has never used smokeless tobacco. She reports that she does not drink alcohol or use drugs.  Medications:  Prior to Admission medications   Medication Sig Start Date End Date Taking? Authorizing Provider  buPROPion (WELLBUTRIN XL) 300 MG 24 hr tablet Take 300 mg by mouth daily.   Yes [provider]  cephALEXin (KEFLEX) 500 MG capsule 2 caps po bid x 7 days 11/03/17  Yes Mesner, Barbara Cower, MD  escitalopram (LEXAPRO) 10 MG tablet TAKE ONE (1) TABLET BY MOUTH EVERY DAY 05/16/17  Yes Cheral Marker, CNM  hydrochlorothiazide (HYDRODIURIL) 25 MG tablet TAKE ONE (1) TABLET BY MOUTH EVERY DAY 05/15/17  Yes Lazaro Arms, MD  ibuprofen (ADVIL,MOTRIN) 600 MG tablet Take 1 tablet (600 mg total) by mouth  every 6 (six) hours. 03/08/16  Yes Degele, Kandra NicolasJulie P, MD  [facility] 0.25-35 MG-MCG tablet Take 1 tablet by mouth daily.  08/20/17  Yes [provider]  potassium chloride SA (K-DUR,KLOR-CON) 20 MEQ tablet Take 2 tablets (40 mEq total) by mouth 2 (two) times daily. 11/03/17  Yes Mesner, Barbara CowerJason, MD      Allergies  Allergen Reactions  . Zolpidem Tartrate Other (See Comments)    Reaction:  Hallucinations   . Latex Itching and Rash    ROS:  Out of a complete 14 system review of symptoms, the patient complains only of the following symptoms, and all other reviewed systems are negative.  Fatigue Headache, weakness Sleepiness  Blood pressure 138/90, pulse 80, height 5' (1.524  m), weight 163 lb 8 oz (74.2 kg), not currently breastfeeding.  Physical Exam  General: The patient is alert and cooperative at the time of the examination.  The patient is mildly to moderately obese.  Eyes: Pupils are equal, round, and reactive to light. Discs are flat bilaterally.  Neck: The neck is supple, no carotid bruits are noted.  Respiratory: The respiratory examination is clear.  Cardiovascular: The cardiovascular examination reveals a regular rate and rhythm, no obvious murmurs or rubs are noted.  Skin: Extremities are without significant edema.  Neurologic Exam  Mental status: The patient is alert and oriented x 3 at the time of the examination. The patient has apparent normal recent and remote memory, with an apparently normal attention span and concentration ability.  Cranial nerves: Facial symmetry is present. There is good sensation of the face to pinprick and soft touch bilaterally. The strength of the facial muscles and the muscles to head turning and shoulder shrug are normal bilaterally. Speech is well enunciated, no aphasia or dysarthria is noted. Extraocular movements are full. Visual fields are full. The tongue is midline, and the patient has symmetric elevation of the soft palate. No obvious hearing deficits are noted.  Motor: The motor testing reveals 5 over 5 strength of all 4 extremities. Good symmetric motor tone is noted throughout.  Sensory: Sensory testing is intact to pinprick, soft touch, vibration sensation, and position sense on all 4 extremities. No evidence of extinction is noted.  Coordination: Cerebellar testing reveals good finger-nose-finger and heel-to-shin bilaterally.  Gait and station: Gait is normal. Tandem gait is normal. Romberg is negative. No drift is seen.  Reflexes: Deep tendon reflexes are symmetric and normal bilaterally. Toes are downgoing bilaterally.   Assessment/Plan:  1.  Mild hydrocephalus by CT brain  2.  Reports of  chronic nausea, heavy sensation of the head  3.  Excessive daytime drowsiness, chronic fatigue  The patient does have a slight increase in ventricular size by CT of the brain, I suspect this does not have much to do with any of her current symptoms.  The patient is having a lot of troubles with chronic fatigue, she has excessive daytime drowsiness.  She will be sent for a sleep referral.  The patient will be sent for blood work today, she reports a recent tick bite, we will look for Lyme disease and [place] spotted fever.  The patient did have a recent urinary tract infection.  She also had hypokalemia on hydrochlorothiazide.  She is now on potassium supplementation.  I will try to review the MRI of the brain that will be done tomorrow.  The patient has excellent balance, she reports no bowel or bladder control problems.  I believe that the ventricular enlargement  is asymptomatic.  Marlan Palau MD 11/14/2017 10:31 AM  [facility] [address] [place], [place] [address]  Phone [phone] Fax [phone]

## 2017-11-15 ENCOUNTER — Ambulatory Visit (HOSPITAL_COMMUNITY)
Admission: RE | Admit: 2017-11-15 | Discharge: 2017-11-15 | Disposition: A | Discharge status: Home / Self Care | Payer: PRIVATE HEALTH INSURANCE | Source: Ambulatory Visit | Attending: Adult Health Nurse Practitioner | Admitting: Adult Health Nurse Practitioner

## 2017-11-15 DIAGNOSIS — G919 Hydrocephalus, unspecified: Secondary | ICD-10-CM | POA: Insufficient documentation

## 2017-11-15 DIAGNOSIS — G4452 New daily persistent headache (NDPH): Secondary | ICD-10-CM | POA: Insufficient documentation

## 2017-11-15 DIAGNOSIS — J019 Acute sinusitis, unspecified: Secondary | ICD-10-CM | POA: Diagnosis not present

## 2017-11-15 DIAGNOSIS — G9389 Other specified disorders of brain: Secondary | ICD-10-CM | POA: Insufficient documentation

## 2017-11-16 ENCOUNTER — Telehealth: Payer: Self-pay | Admitting: Neurology

## 2017-11-16 LAB — COMPREHENSIVE METABOLIC PANEL
A/G RATIO: 1.5 (ref 1.2–2.2)
ALBUMIN: 4.2 g/dL (ref 3.5–5.5)
ALK PHOS: 90 IU/L (ref 39–117)
ALT: 25 IU/L (ref 0–32)
AST: 22 IU/L (ref 0–40)
BUN / CREAT RATIO: 8 — AB (ref 9–23)
BUN: 7 mg/dL (ref 6–20)
CHLORIDE: 101 mmol/L (ref 96–106)
CO2: 25 mmol/L (ref 20–29)
Calcium: 9.2 mg/dL (ref 8.7–10.2)
Creatinine, Ser: 0.88 mg/dL (ref 0.57–1.00)
GFR calc non Af Amer: 85 mL/min/{1.73_m2} (ref >59)
GFR, EST AFRICAN AMERICAN: 98 mL/min/{1.73_m2} (ref >59)
GLOBULIN, TOTAL: 2.8 g/dL (ref 1.5–4.5)
Glucose: 82 mg/dL (ref 65–99)
POTASSIUM: 3.7 mmol/L (ref 3.5–5.2)
SODIUM: 141 mmol/L (ref 134–144)
Total Protein: 7 g/dL (ref 6.0–8.5)

## 2017-11-16 LAB — SEDIMENTATION RATE: SED RATE: 14 mm/h (ref 0–32)

## 2017-11-16 LAB — ANA W/REFLEX: ANA: NEGATIVE

## 2017-11-16 LAB — HIV ANTIBODY (ROUTINE TESTING W REFLEX): HIV Screen 4th Generation wRfx: NONREACTIVE

## 2017-11-16 LAB — B. BURGDORFI ANTIBODIES

## 2017-11-16 LAB — ROCKY MTN SPOTTED FVR ABS PNL(IGG+IGM)
RMSF IgG: NEGATIVE
RMSF IgM: 0.51 index (ref 0.00–0.89)

## 2017-11-16 LAB — VITAMIN B12: VITAMIN B 12: 468 pg/mL (ref 232–1245)

## 2017-11-16 NOTE — Telephone Encounter (Signed)
  I called the patient.  The MRI of the brain shows mild ventriculomegaly, likely asymptomatic.  The patient does have diffuse sinusitis, this is a new finding from the most recent CT of the head.  The primary care physician may want to prescribe antibiotics for her.   MRI brain 11/15/17:  IMPRESSION: 1. Generalized acute sinusitis that is new from 11/03/2017. 2. Mild lateral ventriculomegaly without focal atrophy or obstructive process, likely developmental variant.

## 2017-12-27 ENCOUNTER — Other Ambulatory Visit: Payer: Self-pay | Admitting: Women's Health

## 2018-01-10 ENCOUNTER — Telehealth: Payer: Self-pay

## 2018-01-10 ENCOUNTER — Institutional Professional Consult (permissible substitution): Payer: PRIVATE HEALTH INSURANCE | Admitting: Neurology

## 2018-01-10 NOTE — Telephone Encounter (Signed)
Pt did not show for their appt with Dr. Athar today.  

## 2018-01-11 ENCOUNTER — Encounter: Payer: Self-pay | Admitting: Neurology

## 2018-02-28 ENCOUNTER — Other Ambulatory Visit: Payer: Self-pay | Admitting: Women's Health

## 2018-04-01 ENCOUNTER — Other Ambulatory Visit: Payer: Self-pay | Admitting: Women's Health

## 2018-06-01 ENCOUNTER — Other Ambulatory Visit: Payer: Self-pay | Admitting: Obstetrics & Gynecology

## 2018-08-08 ENCOUNTER — Other Ambulatory Visit: Payer: Self-pay | Admitting: Women's Health

## 2018-10-17 ENCOUNTER — Other Ambulatory Visit: Payer: Self-pay | Admitting: Women's Health

## 2018-11-06 ENCOUNTER — Telehealth: Payer: PRIVATE HEALTH INSURANCE | Admitting: Family

## 2018-11-06 DIAGNOSIS — R399 Unspecified symptoms and signs involving the genitourinary system: Secondary | ICD-10-CM | POA: Diagnosis not present

## 2018-11-06 MED ORDER — CEPHALEXIN 500 MG PO CAPS: 500.0000 mg | ORAL_CAPSULE | Freq: Two times a day (BID) | ORAL | 0 refills | Status: DC

## 2018-11-06 NOTE — Progress Notes (Signed)
We are sorry that you are not feeling well.  Here is how we plan to help!  Approximately 5 minutes was spent documenting and reviewing patient's chart.    Based on what you shared with me it looks like you most likely have a simple urinary tract infection.  A UTI (Urinary Tract Infection) is a bacterial infection of the bladder.  Most cases of urinary tract infections are simple to treat but a key part of your care is to encourage you to drink plenty of fluids and watch your symptoms carefully.  I have prescribed Keflex 500 mg twice a day for 7 days.  Your symptoms should gradually improve. Call us if the burning in your urine worsens, you develop worsening fever, back pain or pelvic pain or if your symptoms do not resolve after completing the antibiotic.  Urinary tract infections can be prevented by drinking plenty of water to keep your body hydrated.  Also be sure when you wipe, wipe from front to back and don't hold it in!  If possible, empty your bladder every 4 hours.  Your e-visit answers were reviewed by a board certified advanced clinical practitioner to complete your personal care plan.  Depending on the condition, your plan could have included both over the counter or prescription medications.  If there is a problem please reply  once you have received a response from your provider.  Your safety is important to [place].  If you have drug allergies check your prescription carefully.    You can use MyChart to ask questions about today's visit, request a non-urgent call back, or ask for a work or school excuse for 24 hours related to this e-Visit. If it has been greater than 24 hours you will need to follow up with your provider, or enter a new e-Visit to address those concerns.   You will get an e-mail in the next two days asking about your experience.  I hope that your e-visit has been valuable and will speed your recovery. Thank you for using e-visits.

## 2019-01-04 ENCOUNTER — Ambulatory Visit (INDEPENDENT_AMBULATORY_CARE_PROVIDER_SITE_OTHER)
Admission: RE | Admit: 2019-01-04 | Discharge: 2019-01-04 | Disposition: A | Discharge status: Home / Self Care | Payer: Self-pay | Source: Ambulatory Visit

## 2019-01-04 DIAGNOSIS — N898 Other specified noninflammatory disorders of vagina: Secondary | ICD-10-CM

## 2019-01-04 DIAGNOSIS — L298 Other pruritus: Secondary | ICD-10-CM

## 2019-01-04 MED ORDER — FLUCONAZOLE 200 MG PO TABS: ORAL_TABLET | ORAL | 0 refills | Status: DC

## 2019-01-04 NOTE — Discharge Instructions (Addendum)
Symptoms consistent with yeast infection Prescribed diflucan 200 mg once daily and then second dose 72 hours later Take medication as prescribed and to completion Follow up in person or with PCP if symptoms persists Follow up in person or go to ER if you have any new or worsening symptoms fever, chills, nausea, vomiting, abdominal or pelvic pain, painful intercourse, worsening vaginal discharge, vaginal bleeding, persistent symptoms despite treatment, etc..Marland Kitchen

## 2019-01-04 NOTE — ED Provider Notes (Addendum)
[facility] Virtual Visit via Video Note:  Marissa JettyJennifer J Barkow  initiated request for Telemedicine visit with [facility]. I connected with Marissa JettyJennifer J Lefevers  on 01/04/2019 at 4:24 PM  for a synchronized telemedicine visit using a video enabled HIPPA compliant telemedicine application. I verified that I am speaking with Marissa JettyJennifer J [place]  using two identifiers. Rennis HardingBrittany Arnecia Ector, PA-C  was physically located in a [facility] site and Marissa JettyJennifer J Valdivia was located at a different location.   The limitations of evaluation and management by telemedicine as well as the availability of in-person appointments were discussed. Patient was informed that she  may incur a bill ( including co-pay) for this virtual visit encounter. Curt BearsJennifer J Rawdon  expressed understanding and gave verbal consent to proceed with virtual visit.  [id number] 01/04/19 Arrival Time: 1223   WU:[id number]:VAGINAL DISCHARGE and itching  SUBJECTIVE:  Marissa Reese is a 38 y.o. female who presents with complaints of thin, white/ clear vaginal discharge and vaginal itching x 1-2 days.  She denies a precipitating event, recent sexual encounter or recent antibiotic use.  States she ate a lot of sugar this past weekend.  She has NOT tried OTC medications.  She denies aggravating factors.  She reports similar symptoms in the past and was diagnosed with yeast infection.  She denies fever, chills, nausea, vomiting, abdominal or pelvic pain, urinary symptoms, vaginal odor, vaginal bleeding, dyspareunia, vaginal rashes or lesions.   Not on BC currently; LMP 12/16/2018   ROS: As per HPI.  All other pertinent ROS negative.     Past Medical History:  Diagnosis Date  . Anxiety   . Depression   . Essential hypertension    Medication since 04/2015  . History of headache   . Hypertension    chronic, was on Lisinopril prior to preg.  . Pregnant 07/17/2015  . Vaginal itching 08/19/2015  . Yeast  infection 08/19/2015   Past Surgical History:  Procedure Laterality Date  . CHOLECYSTECTOMY     Allergies  Allergen Reactions  . Zolpidem Tartrate Other (See Comments)    Reaction:  Hallucinations   . Latex Itching and Rash   No current facility-administered medications on file prior to encounter.    Current Outpatient Medications on File Prior to Encounter  Medication Sig Dispense Refill  . phentermine 37.5 MG capsule Take 37.5 mg by mouth every morning.    Marland Kitchen. buPROPion (WELLBUTRIN XL) 300 MG 24 hr tablet Take 300 mg by mouth daily.    Marland Kitchen. escitalopram (LEXAPRO) 10 MG tablet TAKE ONE (1) TABLET BY MOUTH EVERY DAY 30 tablet 1  . hydrochlorothiazide (HYDRODIURIL) 25 MG tablet TAKE ONE (1) TABLET BY MOUTH EVERY DAY 30 tablet 11  . ibuprofen (ADVIL,MOTRIN) 600 MG tablet Take 1 tablet (600 mg total) by mouth every 6 (six) hours. 30 tablet 0  . [DISCONTINUED] MONO-LINYAH 0.25-35 MG-MCG tablet Take 1 tablet by mouth daily.     . [DISCONTINUED] potassium chloride SA (K-DUR,KLOR-CON) 20 MEQ tablet Take 2 tablets (40 mEq total) by mouth 2 (two) times daily. 28 tablet 0     OBJECTIVE:  There were no vitals filed for this visit.  General appearance: alert; no distress Eyes: EOMI grossly HENT: normocephalic; atraumatic Neck: supple with FROM Lungs: normal respiratory effort; speaking in full sentences without difficulty Extremities: moves extremities without difficulty Skin: No obvious rashes Neurologic: No facial asymmetries Psychological: alert and cooperative; normal mood and affect  ASSESSMENT & PLAN:  1.  Vaginal discharge   2. Vaginal itching     Meds ordered this encounter  Medications  . fluconazole (DIFLUCAN) 200 MG tablet    Sig: Take one dose by mouth, wait 72 hours, and then take second dose by mouth    Dispense:  2 tablet    Refill:  0    Order Specific Question:   Supervising Provider    Answer:   Raylene Everts [[id number]]   Symptoms consistent with yeast infection  Prescribed diflucan 200 mg once daily and then second dose 72 hours later Take medication as prescribed and to completion Follow up in person or with PCP if symptoms persists Follow up in person or go to ER if you have any new or worsening symptoms fever, chills, nausea, vomiting, abdominal or pelvic pain, painful intercourse, worsening vaginal discharge, vaginal bleeding, persistent symptoms despite treatment, etc...  I discussed the assessment and treatment plan with the patient. The patient was provided an opportunity to ask questions and all were answered. The patient agreed with the plan and demonstrated an understanding of the instructions.   The patient was advised to call back or seek an in-person evaluation if the symptoms worsen or if the condition fails to improve as anticipated.  I provided 10 minutes of non-face-to-face time during this encounter.  [place], PA-C  01/04/2019 4:24 PM       Stacey Drain Mun[place], PA-C 01/04/19 [place], [place], [place] 01/04/19 1624

## 2019-02-12 ENCOUNTER — Telehealth: Payer: Self-pay | Admitting: *Deleted

## 2019-02-12 ENCOUNTER — Telehealth: Payer: Self-pay | Admitting: Obstetrics & Gynecology

## 2019-02-12 ENCOUNTER — Telehealth: Payer: Self-pay | Admitting: Adult Health

## 2019-02-12 NOTE — Telephone Encounter (Signed)
Patient states she had severe cramps last night and passed what looked like tissue.  Her period is actually a week early and she has not taken a pregnancy test.  Advised to take a pregnancy test and if negative to let us know and to also upload and send mychart message for it to be looked at.  Pt verbalized understanding.

## 2019-02-12 NOTE — Telephone Encounter (Signed)
Patient thinks she may have had a miscarriage.  She stated that she didn't even know that she was pregnant so she's not sure.  She requested to speak to [facility] then you.  [phone]

## 2019-02-12 NOTE — Telephone Encounter (Signed)
LMOVM returning patient's call.  

## 2019-02-12 NOTE — Telephone Encounter (Signed)
Error

## 2019-02-12 NOTE — Telephone Encounter (Signed)
Pt is returning you call. I told pt you were with a patient and would call her back soon

## 2019-03-27 ENCOUNTER — Other Ambulatory Visit: Payer: Self-pay

## 2019-03-27 DIAGNOSIS — Z20822 Contact with and (suspected) exposure to covid-19: Secondary | ICD-10-CM

## 2019-03-28 LAB — NOVEL CORONAVIRUS, NAA: SARS-CoV-2, NAA: NOT DETECTED

## 2019-06-06 ENCOUNTER — Other Ambulatory Visit: Payer: Self-pay | Admitting: Obstetrics & Gynecology

## 2020-06-23 ENCOUNTER — Telehealth: Payer: Self-pay | Admitting: Adult Health

## 2020-06-23 NOTE — Telephone Encounter (Signed)
Patient wanted NP Valentina Lucks to know that she's bleeding through an super plus tampon less than a hours time. Clinical staff will follow up with patient.

## 2020-06-23 NOTE — Telephone Encounter (Signed)
Pt called concerned with vaginal odor, states felt like a possible cyst ruptured 2 weeks ago, has had odor since then, started her period yesterday, bleeding very heavy & odor is awful Soaking an ultra tampon with leakage on to a pad in about an hour No unusual pain  Please advise & notify pt - OK for MyChart message (Lenoria's schedule is completely full until 07/07/2020)

## 2020-06-23 NOTE — Telephone Encounter (Signed)
Pt has odor and is bleeding heavy, will work in in am at 9:50 am,so rest today and push fluids

## 2020-06-24 ENCOUNTER — Other Ambulatory Visit: Payer: Self-pay

## 2020-06-24 ENCOUNTER — Encounter: Payer: Self-pay | Admitting: Adult Health

## 2020-06-24 ENCOUNTER — Ambulatory Visit (INDEPENDENT_AMBULATORY_CARE_PROVIDER_SITE_OTHER): Payer: PRIVATE HEALTH INSURANCE | Admitting: Adult Health

## 2020-06-24 VITALS — BP 146/98 | HR 93 | Ht 61.0 in | Wt 149.8 lb

## 2020-06-24 DIAGNOSIS — B9689 Other specified bacterial agents as the cause of diseases classified elsewhere: Secondary | ICD-10-CM | POA: Diagnosis not present

## 2020-06-24 DIAGNOSIS — N898 Other specified noninflammatory disorders of vagina: Secondary | ICD-10-CM | POA: Diagnosis not present

## 2020-06-24 DIAGNOSIS — W448XXA Other foreign body entering into or through a natural orifice, initial encounter: Secondary | ICD-10-CM | POA: Insufficient documentation

## 2020-06-24 DIAGNOSIS — N760 Acute vaginitis: Secondary | ICD-10-CM | POA: Diagnosis not present

## 2020-06-24 DIAGNOSIS — R58 Hemorrhage, not elsewhere classified: Secondary | ICD-10-CM

## 2020-06-24 DIAGNOSIS — T192XXA Foreign body in vulva and vagina, initial encounter: Secondary | ICD-10-CM | POA: Insufficient documentation

## 2020-06-24 LAB — POCT WET PREP (WET MOUNT)
Clue Cells Wet Prep Whiff POC: NEGATIVE
WBC, Wet Prep HPF POC: POSITIVE

## 2020-06-24 LAB — POCT HEMOGLOBIN: Hemoglobin: 12.5 g/dL (ref 11–14.6)

## 2020-06-24 MED ORDER — METRONIDAZOLE 500 MG PO TABS: 500.0000 mg | ORAL_TABLET | Freq: Two times a day (BID) | ORAL | 0 refills | Status: DC

## 2020-06-24 NOTE — Progress Notes (Signed)
  Subjective:     Patient ID: San Jetty, female   DOB: 1981-01-31, 40 y.o.   MRN: [id number]  HPI Rosaura is a 40 year old white female, married,G3P3 in for complaints of heavy bleeding yesterday with bad odor, then las night removed retained tampon. Odor is much better and bleeding has slowed down. PCP is Dr Margo Aye.   Review of Systems Bleeding,heavy yesterday  +vaginal odor Reviewed past medical,surgical, social and family history. Reviewed medications and allergies.     Objective:   Physical Exam BP (!) 146/98 (BP Location: Right Arm, Patient Position: Sitting, Cuff Size: Normal)   Pulse 93   Ht 5\' 1"  (1.549 m)   Wt 149 lb 12.8 oz (67.9 kg)   LMP 06/22/2020 (Exact Date)   BMI 28.30 kg/m HGB 12.5 Skin warm and dry.Pelvic: external genitalia is normal in appearance no lesions, vagina: +period blood, no odor, just blood smell,urethra has no lesions or masses noted, cervix:smooth and bulbous,no CMT, uterus: normal size, shape and contour, non tender, no masses felt, adnexa: no masses or tenderness noted. Bladder is non tender and no masses felt. Wet prep: + for WBCs and RBCs and clue cells.   Fall risk is low  Upstream - 06/24/20 1047      Pregnancy Intention Screening   Does the patient want to become pregnant in the next year? No    Does the patient's partner want to become pregnant in the next year? No    Would the patient like to discuss contraceptive options today? No      Contraception Wrap Up   Current Method Withdrawal or Other Method    End Method Withdrawal or Other Method    Contraception Counseling Provided No         Examination chaperoned by 08/22/20.  Assessment:     1. Bleeding  2. Retained tampon, initial encounter  3. BV (bacterial vaginosis) Rx flagyl 500 mg 1 bid x 7 days, no alcohol,     Meds ordered this encounter  Medications  . metroNIDAZOLE (FLAGYL) 500 MG tablet    Sig: Take 1 tablet (500 mg total) by mouth 2 (two) times daily.     Dispense:  14 tablet    Refill:  0    Order Specific Question:   Supervising Provider    Answer:   [employer], LUTHER H [[id number]]     4. Vaginal odor     Plan:    Note given for work for visit today  Return in 3 months for pap and physical

## 2020-06-25 ENCOUNTER — Ambulatory Visit: Payer: Medicaid Other | Admitting: Adult Health

## 2020-09-04 ENCOUNTER — Emergency Department (HOSPITAL_COMMUNITY)
Admission: EM | Admit: 2020-09-04 | Discharge: 2020-09-04 | Disposition: A | Discharge status: Home / Self Care | Payer: PRIVATE HEALTH INSURANCE | Attending: Emergency Medicine | Admitting: Emergency Medicine

## 2020-09-04 ENCOUNTER — Other Ambulatory Visit: Payer: Self-pay

## 2020-09-04 ENCOUNTER — Encounter (HOSPITAL_COMMUNITY): Payer: Self-pay | Admitting: *Deleted

## 2020-09-04 DIAGNOSIS — R197 Diarrhea, unspecified: Secondary | ICD-10-CM | POA: Diagnosis not present

## 2020-09-04 DIAGNOSIS — Z20822 Contact with and (suspected) exposure to covid-19: Secondary | ICD-10-CM | POA: Insufficient documentation

## 2020-09-04 DIAGNOSIS — Z79899 Other long term (current) drug therapy: Secondary | ICD-10-CM | POA: Insufficient documentation

## 2020-09-04 DIAGNOSIS — Z7982 Long term (current) use of aspirin: Secondary | ICD-10-CM | POA: Diagnosis not present

## 2020-09-04 DIAGNOSIS — Z87891 Personal history of nicotine dependence: Secondary | ICD-10-CM | POA: Insufficient documentation

## 2020-09-04 DIAGNOSIS — R112 Nausea with vomiting, unspecified: Secondary | ICD-10-CM | POA: Insufficient documentation

## 2020-09-04 DIAGNOSIS — Z91040 Latex allergy status: Secondary | ICD-10-CM | POA: Insufficient documentation

## 2020-09-04 DIAGNOSIS — I10 Essential (primary) hypertension: Secondary | ICD-10-CM | POA: Insufficient documentation

## 2020-09-04 DIAGNOSIS — R109 Unspecified abdominal pain: Secondary | ICD-10-CM | POA: Insufficient documentation

## 2020-09-04 DIAGNOSIS — R1110 Vomiting, unspecified: Secondary | ICD-10-CM

## 2020-09-04 LAB — BASIC METABOLIC PANEL
Anion gap: 9 (ref 5–15)
BUN: 11 mg/dL (ref 6–20)
CO2: 24 mmol/L (ref 22–32)
Calcium: 8.4 mg/dL — ABNORMAL LOW (ref 8.9–10.3)
Chloride: 105 mmol/L (ref 98–111)
Creatinine, Ser: 0.79 mg/dL (ref 0.44–1.00)
GFR, Estimated: >60 mL/min (ref >60)
Glucose, Bld: 115 mg/dL — ABNORMAL HIGH (ref 70–99)
Potassium: 3.3 mmol/L — ABNORMAL LOW (ref 3.5–5.1)
Sodium: 138 mmol/L (ref 135–145)

## 2020-09-04 LAB — HEPATIC FUNCTION PANEL
ALT: 50 U/L — ABNORMAL HIGH (ref 0–44)
AST: 116 U/L — ABNORMAL HIGH (ref 15–41)
Albumin: 3.6 g/dL (ref 3.5–5.0)
Alkaline Phosphatase: 105 U/L (ref 38–126)
Bilirubin, Direct: 0.3 mg/dL — ABNORMAL HIGH (ref 0.0–0.2)
Indirect Bilirubin: 0.6 mg/dL (ref 0.3–0.9)
Total Bilirubin: 0.9 mg/dL (ref 0.3–1.2)
Total Protein: 6.5 g/dL (ref 6.5–8.1)

## 2020-09-04 LAB — CBC WITH DIFFERENTIAL/PLATELET
Abs Immature Granulocytes: 0.04 10*3/uL (ref 0.00–0.07)
Basophils Absolute: 0 10*3/uL (ref 0.0–0.1)
Basophils Relative: 0 %
Eosinophils Absolute: 0.1 10*3/uL (ref 0.0–0.5)
Eosinophils Relative: 0 %
HCT: 40.8 % (ref 36.0–46.0)
Hemoglobin: 12.9 g/dL (ref 12.0–15.0)
Immature Granulocytes: 0 %
Lymphocytes Relative: 4 %
Lymphs Abs: 0.5 10*3/uL — ABNORMAL LOW (ref 0.7–4.0)
MCH: 27.6 pg (ref 26.0–34.0)
MCHC: 31.6 g/dL (ref 30.0–36.0)
MCV: 87.2 fL (ref 80.0–100.0)
Monocytes Absolute: 0.6 10*3/uL (ref 0.1–1.0)
Monocytes Relative: 5 %
Neutro Abs: 11.5 10*3/uL — ABNORMAL HIGH (ref 1.7–7.7)
Neutrophils Relative %: 91 %
Platelets: 428 10*3/uL — ABNORMAL HIGH (ref 150–400)
RBC: 4.68 MIL/uL (ref 3.87–5.11)
RDW: 14.9 % (ref 11.5–15.5)
WBC: 12.6 10*3/uL — ABNORMAL HIGH (ref 4.0–10.5)
nRBC: 0 % (ref 0.0–0.2)

## 2020-09-04 MED ORDER — LIDOCAINE VISCOUS HCL 2 % MT SOLN
15.0000 mL | Freq: Once | OROMUCOSAL | Status: AC
  Administered 2020-09-04: 15 mL via ORAL
  Filled 2020-09-04: qty 15

## 2020-09-04 MED ORDER — PROMETHAZINE HCL 25 MG RE SUPP
25.0000 mg | Freq: Four times a day (QID) | RECTAL | 1 refills | Status: AC | PRN
Start: 2020-09-04 — End: ?

## 2020-09-04 MED ORDER — SODIUM CHLORIDE 0.9 % IV BOLUS
1000.0000 mL | Freq: Once | INTRAVENOUS | Status: AC
  Administered 2020-09-04: 1000 mL via INTRAVENOUS

## 2020-09-04 MED ORDER — DIPHENHYDRAMINE HCL 50 MG/ML IJ SOLN
25.0000 mg | Freq: Once | INTRAMUSCULAR | Status: AC
  Administered 2020-09-04: 25 mg via INTRAVENOUS
  Filled 2020-09-04: qty 1

## 2020-09-04 MED ORDER — ONDANSETRON HCL 4 MG/2ML IJ SOLN
4.0000 mg | Freq: Once | INTRAMUSCULAR | Status: AC
  Administered 2020-09-04: 4 mg via INTRAVENOUS
  Filled 2020-09-04: qty 2

## 2020-09-04 MED ORDER — METOCLOPRAMIDE HCL 5 MG/ML IJ SOLN
10.0000 mg | Freq: Once | INTRAMUSCULAR | Status: AC
  Administered 2020-09-04: 10 mg via INTRAVENOUS
  Filled 2020-09-04: qty 2

## 2020-09-04 MED ORDER — ALUM & MAG HYDROXIDE-SIMETH 200-200-20 MG/5ML PO SUSP
30.0000 mL | Freq: Once | ORAL | Status: AC
  Administered 2020-09-04: 30 mL via ORAL
  Filled 2020-09-04: qty 30

## 2020-09-04 NOTE — ED Triage Notes (Signed)
Pt with N/V and numbness to bilateral hands and face. Pt states numbness has decreased. Family at home has been sick with gi bug.  Took phenergan supp and hour and half ago.  zofran this am.

## 2020-09-04 NOTE — ED Notes (Signed)
Pt given ice chips per request

## 2020-09-04 NOTE — ED Provider Notes (Signed)
[facility] EMERGENCY DEPARTMENT Provider Note   CSN: [id number] Arrival date & time: 09/04/20  1840     History Chief Complaint  Patient presents with  . Emesis    Marissa Reese is a 40 y.o. female.  Pt complains of nausea and vomiting.  Pt reports everyone in her family has had a gi bug.    The history is provided by the patient. No language interpreter was used.  Emesis Timing:  Constant Progression:  Worsening Chronicity:  New Recent urination:  Normal Relieved by:  Nothing Worsened by:  Nothing Associated symptoms: abdominal pain        Past Medical History:  Diagnosis Date  . Anxiety   . Depression   . Essential hypertension    Medication since 04/2015  . History of headache   . Hypertension    chronic, was on Lisinopril prior to preg.  . Pregnant 07/17/2015  . Vaginal itching 08/19/2015  . Yeast infection 08/19/2015    Patient Active Problem List   Diagnosis Date Noted  . BV (bacterial vaginosis) 06/24/2020  . Retained tampon 06/24/2020  . Bleeding 06/24/2020  . Vaginal odor 06/24/2020  . Mild pre-eclampsia, postpartum 03/14/2016  . Yeast infection 08/19/2015  . Depression with anxiety 08/12/2015  . Chronic hypertension 08/12/2015  . Smoker 08/12/2015    Past Surgical History:  Procedure Laterality Date  . CHOLECYSTECTOMY       OB History    Gravida  3   Para  3   Term  3   Preterm      AB      Living  3     SAB      IAB      Ectopic      Multiple  0   Live Births  3           Family History  Problem Relation Age of Onset  . Diabetes Father   . Heart attack Father        Age 85  . Hypertension Father   . Hypertension Mother   . Hyperlipidemia Mother   . Heart attack Maternal Grandfather   . Heart attack Paternal Grandmother   . Stroke Paternal Grandmother   . Heart attack Paternal Grandfather     Social History   Tobacco Use  . Smoking status: Former Smoker    Packs/day: 0.00    Years: 17.00     Pack years: 0.00    Types: Cigarettes    Start date: 07/14/1991    Quit date: 07/13/2015    Years since quitting: 5.1  . Smokeless tobacco: Never Used  Vaping Use  . Vaping Use: Never used  Substance Use Topics  . Alcohol use: No    Alcohol/week: 0.0 standard drinks  . Drug use: No    Home Medications Prior to Admission medications   Medication Sig Start Date End Date Taking? Authorizing Provider  amphetamine-dextroamphetamine (ADDERALL) 30 MG tablet Take 1 tablet by mouth 2 (two) times daily. 06/20/20  Yes [provider]  atorvastatin (LIPITOR) 10 MG tablet Take 10 mg by mouth 2 (two) times daily. 06/02/20  Yes [provider]  buPROPion (WELLBUTRIN XL) 300 MG 24 hr tablet Take 300 mg by mouth daily.   Yes [provider]  escitalopram (LEXAPRO) 10 MG tablet TAKE ONE (1) TABLET BY MOUTH EVERY DAY 08/08/18  Yes Cheral Marker, CNM  ibuprofen (ADVIL) 200 MG tablet Take 200 mg by mouth every 6 (six) hours  as needed.   Yes [provider]  lisinopril-hydrochlorothiazide (ZESTORETIC) 10-12.5 MG tablet Take 1 tablet by mouth daily. 08/27/20  Yes [provider]  ondansetron (ZOFRAN) 4 MG tablet Take 4 mg by mouth every 8 (eight) hours as needed. 05/26/20  Yes [provider]  promethazine (PHENERGAN) 25 MG suppository Place 25 mg rectally every 6 (six) hours as needed for nausea or vomiting.   Yes [provider]  metroNIDAZOLE (FLAGYL) 500 MG tablet Take 1 tablet (500 mg total) by mouth 2 (two) times daily. Patient not taking: Reported on 09/04/2020 06/24/20   Adline Potter, NP  [facility] 0.25-35 MG-MCG tablet Take 1 tablet by mouth daily.  08/20/17 01/04/19  [provider]  potassium chloride SA (K-DUR,KLOR-CON) 20 MEQ tablet Take 2 tablets (40 mEq total) by mouth 2 (two) times daily. 11/03/17 01/04/19  Mesner, Barbara Cower, MD    Allergies    Zolpidem tartrate and Latex  Review of Systems   Review of Systems   Gastrointestinal: Positive for abdominal pain and vomiting.  All other systems reviewed and are negative.   Physical Exam Updated Vital Signs BP 138/83   Pulse 91   Temp 98.6 F (37 C) (Oral)   Resp 16   Ht 5\' 1"  (1.549 m)   Wt 68 kg   LMP 08/17/2020   SpO2 100%   BMI 28.34 kg/m   Physical Exam Vitals and nursing note reviewed.  Constitutional:      Appearance: She is well-developed.  HENT:     Head: Normocephalic.     Mouth/Throat:     Mouth: Mucous membranes are moist.  Cardiovascular:     Rate and Rhythm: Normal rate.  Pulmonary:     Effort: Pulmonary effort is normal.  Abdominal:     General: Abdomen is flat. There is no distension.  Musculoskeletal:        General: Normal range of motion.     Cervical back: Normal range of motion.  Skin:    General: Skin is warm.  Neurological:     General: No focal deficit present.     Mental Status: She is alert and oriented to person, place, and time.  Psychiatric:        Mood and Affect: Mood normal.     ED Results / Procedures / Treatments   Labs (all labs ordered are listed, but only abnormal results are displayed) Labs Reviewed  BASIC METABOLIC PANEL - Abnormal; Notable for the following components:      Result Value   Potassium 3.3 (*)    Glucose, Bld 115 (*)    Calcium 8.4 (*)    All other components within normal limits  HEPATIC FUNCTION PANEL - Abnormal; Notable for the following components:   AST 116 (*)    ALT 50 (*)    Bilirubin, Direct 0.3 (*)    All other components within normal limits  CBC WITH DIFFERENTIAL/PLATELET - Abnormal; Notable for the following components:   WBC 12.6 (*)    Platelets 428 (*)    Neutro Abs 11.5 (*)    Lymphs Abs 0.5 (*)    All other components within normal limits  CBC WITH DIFFERENTIAL/PLATELET    EKG None  Radiology No results found.  Procedures Procedures   Medications Ordered in ED Medications  sodium chloride 0.9 % bolus 1,000 mL (has no  administration in time range)  ondansetron (ZOFRAN) injection 4 mg (has no administration in time range)    ED Course  I have reviewed the triage vital signs and the nursing notes.  Pertinent labs & imaging results that were available during my care of the patient were reviewed by me and considered in my medical decision making (see chart for details).    MDM Rules/Calculators/A&P                          MDM:  Pt given zofran iv and iv fluids.   Pt's care turned over to [facility] for final disposition  Final Clinical Impression(s) / ED Diagnoses Final diagnoses:  Vomiting and diarrhea    Rx / DC Orders ED Discharge Orders    None    An After Visit Summary was printed and given to the patient.    Osie Cheeks 09/04/20 2118    Derwood Kaplan, MD 09/06/20 [phone]

## 2020-09-04 NOTE — Discharge Instructions (Addendum)
Please read instructions below. Drink clear liquids until your stomach feels better. Then, slowly introduce bland foods into your diet as tolerated, such as bread, rice, apples, bananas. You can take phenergan every 6 hours as needed for nausea. You have been given an additional refill in this prescription as well. Follow up with your primary care if symptoms persist. Return to the ER for severe abdominal pain, fever, uncontrollable vomiting, or new or concerning symptoms.

## 2020-09-04 NOTE — ED Provider Notes (Signed)
Care assumed at shift change from [facility], pending reevaluation and p.o. challenged with anticipated discharge.  Patient presenting with nausea, vomiting, diarrhea.  Family all with similar symptoms of GI virus.  Blood work is overall unremarkable.  Plan to p.o. challenge with anticipated discharge to home. Physical Exam  BP 116/69   Pulse 96   Temp 98.6 F (37 C) (Oral)   Resp 16   Ht 5\' 1"  (1.549 m)   Wt 68 kg   LMP 08/17/2020   SpO2 99%   BMI 28.34 kg/m   Physical Exam Vitals and nursing note reviewed.  Constitutional:      Appearance: She is well-developed.     Comments: Pt resting  Cardiovascular:     Rate and Rhythm: Normal rate.  Pulmonary:     Effort: Pulmonary effort is normal.     ED Course/Procedures     Procedures  MDM  On reevaluation, patient reports improvement in symptoms and feels ready for discharge.  Will discharge with refill for Phenergan suppository, recommend clear liquids and slowly advance diet as tolerated.  Return precautions discussed.        Rob[place], 10/17/2020 N, PA-C 09/04/20 2213    2214, MD 09/06/20 [phone]

## 2020-09-04 NOTE — ED Notes (Signed)
Pt c/o nausea and headache, [name], PA made aware. New orders received.

## 2020-09-05 LAB — SARS CORONAVIRUS 2 (TAT 6-24 HRS): SARS Coronavirus 2: NEGATIVE

## 2020-09-22 ENCOUNTER — Other Ambulatory Visit: Payer: PRIVATE HEALTH INSURANCE | Admitting: Adult Health

## 2022-08-18 ENCOUNTER — Other Ambulatory Visit (HOSPITAL_COMMUNITY): Payer: Self-pay | Admitting: Adult Health Nurse Practitioner

## 2022-08-18 DIAGNOSIS — N63 Unspecified lump in unspecified breast: Secondary | ICD-10-CM

## 2022-09-07 ENCOUNTER — Ambulatory Visit (HOSPITAL_COMMUNITY)
Admission: RE | Admit: 2022-09-07 | Discharge: 2022-09-07 | Disposition: A | Discharge status: Home / Self Care | Payer: PRIVATE HEALTH INSURANCE | Source: Ambulatory Visit | Attending: Adult Health Nurse Practitioner

## 2022-09-07 ENCOUNTER — Encounter (HOSPITAL_COMMUNITY): Payer: Self-pay

## 2022-09-07 ENCOUNTER — Ambulatory Visit (HOSPITAL_COMMUNITY)
Admission: RE | Admit: 2022-09-07 | Discharge: 2022-09-07 | Disposition: A | Discharge status: Home / Self Care | Payer: PRIVATE HEALTH INSURANCE | Source: Ambulatory Visit | Attending: Adult Health Nurse Practitioner | Admitting: Adult Health Nurse Practitioner

## 2022-09-07 DIAGNOSIS — N63 Unspecified lump in unspecified breast: Secondary | ICD-10-CM | POA: Insufficient documentation

## 2022-09-07 DIAGNOSIS — R92333 Mammographic heterogeneous density, bilateral breasts: Secondary | ICD-10-CM | POA: Insufficient documentation

## 2022-09-07 DIAGNOSIS — N6321 Unspecified lump in the left breast, upper outer quadrant: Secondary | ICD-10-CM | POA: Insufficient documentation

## 2022-09-07 DIAGNOSIS — Z1239 Encounter for other screening for malignant neoplasm of breast: Secondary | ICD-10-CM | POA: Insufficient documentation

## 2022-09-08 ENCOUNTER — Other Ambulatory Visit (HOSPITAL_COMMUNITY): Payer: Self-pay | Admitting: Adult Health Nurse Practitioner

## 2022-09-08 ENCOUNTER — Encounter (HOSPITAL_COMMUNITY): Payer: Self-pay | Admitting: Adult Health Nurse Practitioner

## 2022-09-08 DIAGNOSIS — R928 Other abnormal and inconclusive findings on diagnostic imaging of breast: Secondary | ICD-10-CM

## 2022-09-08 DIAGNOSIS — R921 Mammographic calcification found on diagnostic imaging of breast: Secondary | ICD-10-CM

## 2022-09-24 ENCOUNTER — Ambulatory Visit
Admission: RE | Admit: 2022-09-24 | Discharge: 2022-09-24 | Disposition: A | Discharge status: Home / Self Care | Payer: PRIVATE HEALTH INSURANCE | Source: Ambulatory Visit | Attending: Adult Health Nurse Practitioner

## 2022-09-24 ENCOUNTER — Ambulatory Visit
Admission: RE | Admit: 2022-09-24 | Discharge: 2022-09-24 | Disposition: A | Discharge status: Home / Self Care | Payer: PRIVATE HEALTH INSURANCE | Source: Ambulatory Visit | Attending: Adult Health Nurse Practitioner | Admitting: Adult Health Nurse Practitioner

## 2022-09-24 DIAGNOSIS — R921 Mammographic calcification found on diagnostic imaging of breast: Secondary | ICD-10-CM

## 2022-09-24 DIAGNOSIS — R928 Other abnormal and inconclusive findings on diagnostic imaging of breast: Secondary | ICD-10-CM

## 2022-09-24 HISTORY — PX: BREAST BIOPSY: SHX20

## 2022-10-01 ENCOUNTER — Ambulatory Visit: Payer: Self-pay | Admitting: Surgery

## 2022-10-05 ENCOUNTER — Telehealth: Payer: Self-pay | Admitting: Genetic Counselor

## 2022-10-05 NOTE — Telephone Encounter (Signed)
Reached out to patient to schedule per 4/23 IB, left voicemail. Mailing appointment reminders.

## 2022-10-19 ENCOUNTER — Other Ambulatory Visit: Payer: Self-pay | Admitting: Genetic Counselor

## 2022-10-19 ENCOUNTER — Inpatient Hospital Stay: Payer: PRIVATE HEALTH INSURANCE | Attending: Hematology and Oncology

## 2022-10-19 ENCOUNTER — Inpatient Hospital Stay (HOSPITAL_BASED_OUTPATIENT_CLINIC_OR_DEPARTMENT_OTHER): Payer: PRIVATE HEALTH INSURANCE | Admitting: Genetic Counselor

## 2022-10-19 ENCOUNTER — Other Ambulatory Visit: Payer: Self-pay

## 2022-10-19 ENCOUNTER — Other Ambulatory Visit: Payer: Self-pay | Admitting: *Deleted

## 2022-10-19 ENCOUNTER — Inpatient Hospital Stay (HOSPITAL_BASED_OUTPATIENT_CLINIC_OR_DEPARTMENT_OTHER): Payer: PRIVATE HEALTH INSURANCE | Admitting: Hematology and Oncology

## 2022-10-19 ENCOUNTER — Encounter: Payer: Self-pay | Admitting: *Deleted

## 2022-10-19 VITALS — BP 128/78 | HR 105 | Temp 97.5°F | Resp 18 | Ht 61.0 in | Wt 134.6 lb

## 2022-10-19 DIAGNOSIS — D0512 Intraductal carcinoma in situ of left breast: Secondary | ICD-10-CM | POA: Diagnosis not present

## 2022-10-19 DIAGNOSIS — Z800 Family history of malignant neoplasm of digestive organs: Secondary | ICD-10-CM

## 2022-10-19 DIAGNOSIS — Z803 Family history of malignant neoplasm of breast: Secondary | ICD-10-CM

## 2022-10-19 DIAGNOSIS — Z87891 Personal history of nicotine dependence: Secondary | ICD-10-CM | POA: Diagnosis not present

## 2022-10-19 LAB — GENETIC SCREENING ORDER

## 2022-10-19 NOTE — Assessment & Plan Note (Addendum)
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 It is considered a precancerous lesion. DCIS is classified as a 0. It is generally detected through mammograms as calcifications. We discussed the significance of grades and its impact on prognosis. We also discussed the importance of ER and PR receptors and their implications to adjuvant treatment options. Prognosis of DCIS dependence on grade, comedo necrosis. It is anticipated that if not treated, 20-30% of DCIS can develop into invasive breast cancer.  Recommendation: Patient decided to do bilateral mastectomies I discussed with her if the final pathology is DCIS that she does not need radiation and antiestrogen treatments Genetic counseling  Return to clinic after surgery to discuss the final pathology report and come up with an adjuvant treatment plan.

## 2022-10-19 NOTE — Progress Notes (Unsigned)
REFERRING PROVIDER: Serena Croissant, MD  PRIMARY PROVIDER:  Roe Rutherford, NP  PRIMARY REASON FOR VISIT:  Encounter Diagnoses  Name Primary?   Ductal carcinoma in situ (DCIS) of left breast Yes   Family history of breast cancer    Family history of pancreatic cancer    I connected with Marissa Reese on 10/19/2022 at 3:30 EDT by telephone and verified that I am speaking with the correct person using two identifiers.   Patient location: [place], [place] Provider location: [place]  HISTORY OF PRESENT ILLNESS:   Marissa Reese, a 42 y.o. female, was seen for a [facility] cancer genetics consultation at the request of Dr. Pamelia Hoit due to a personal and family history of cancer.  Marissa Reese presents to clinic today to discuss the possibility of a hereditary predisposition to cancer, to discuss genetic testing, and to further clarify her future cancer risks, as well as potential cancer risks for family members.   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 The treatment plan includes a bilateral mastectomy.   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  First live birth at age 46.  OCP use for approximately <1 year.  Ovaries intact: yes.  Uterus intact: yes.  Menopausal status: premenopausal.  HRT use: 0 years. Colonoscopy:  no Mammogram within the last year: yes. Any excessive radiation exposure in the past: no  Past Medical History:  Diagnosis Date   Anxiety    Depression    Essential hypertension    Medication since 04/2015   History of headache    Hypertension    chronic, was on Lisinopril prior to preg.   Pregnant 07/17/2015   Vaginal itching 08/19/2015   Yeast infection 08/19/2015    Past Surgical History:  Procedure Laterality Date   BREAST BIOPSY Left 09/24/2022   MM LT BREAST BX W LOC DEV 1ST LESION IMAGE BX SPEC STEREO GUIDE 09/24/2022 [facility]   BREAST BIOPSY Left 09/24/2022   MM LT BREAST BX W LOC DEV EA AD LESION IMG BX SPEC STEREO GUIDE 09/24/2022 [facility]   CHOLECYSTECTOMY      Social History   Socioeconomic History   Marital status: Married    Spouse name: Not on file   Number of children: Not on file   Years of education: Not on file   Highest education level: Not on file  Occupational History   Not on file  Tobacco Use   Smoking status: Former    Packs/day: 0.00    Years: 17.00    Additional pack years: 0.00    Total pack years: 0.00    Types: Cigarettes    Start date: 07/14/1991    Quit date: 07/13/2015    Years since quitting: 7.2   Smokeless tobacco: Never  Vaping Use   Vaping Use: Never used  Substance and Sexual Activity   Alcohol use: No    Alcohol/week: 0.0 standard drinks of alcohol   Drug use: No   Sexual activity: Yes    Birth control/protection: Coitus interruptus  Other Topics Concern   Not on file  Social History Narrative   Not on file   Social Determinants of Health   Financial Resource Strain: Not on file  Food Insecurity: Not on file  Transportation Needs: Not on file  Physical Activity: Not on file  Stress: Not on file  Social Connections: Not on file     FAMILY HISTORY:  We obtained a detailed, 4-generation family history.  Significant diagnoses are listed below: Family History  Problem Relation Age of Onset   Breast  cancer Reese 47       bilateral   Hypertension Reese    Hyperlipidemia Reese    Diabetes Father    Heart attack Father        Age 6   Hypertension Father    Heart attack Maternal Grandfather    Pancreatic cancer Maternal Grandfather 35   Heart attack Paternal Grandmother    Stroke Paternal Grandmother    Heart attack Paternal Grandfather       Marissa Reese was diagnosed with bilateral breast cancer at age 35, she reportedly had genetic testing but Marissa Reese does not know the results. Her maternal grandfather was diagnosed with pancreatic cancer at age 69, he died at age 59. There is no reported Ashkenazi Jewish ancestry.   GENETIC COUNSELING ASSESSMENT: Marissa Reese is a 42 y.o. female with a personal and family history of cancer which is somewhat suggestive of a hereditary cancer syndrome and predisposition to cancer given her young age at diagnosis. We, therefore, discussed and recommended the following at today's visit.   DISCUSSION: We discussed that 5 - 10% of cancer is hereditary, with most cases of hereditary breast cancer associated with mutations in BRCA1/2.  There are other genes that can be associated with hereditary breast cancer syndromes. Type of cancer risk and level of risk are gene-specific. We discussed that testing is beneficial for several reasons including knowing how to follow individuals after completing their treatment, identifying whether potential treatment options would be beneficial, and understanding if other family members could be at risk for cancer and allowing them to undergo genetic testing.   We reviewed the characteristics, features and inheritance patterns of hereditary cancer syndromes. We also discussed genetic testing, including the appropriate family members to test, the process of testing, insurance coverage and turn-around-time for results. We discussed the implications of a negative, positive and/or variant of uncertain significant  result.   Marissa Reese was offered a common hereditary cancer panel (48 genes) and an expanded pan-cancer panel (70 genes). Marissa Reese was informed of the benefits and limitations of each panel, including that expanded pan-cancer panels contain genes that do not have clear management guidelines at this point in time.  We also discussed that as the number of genes included on a panel increases, the chances of variants of uncertain significance increases.  After considering the benefits and limitations of each gene panel, Marissa Reese elected to have [facility] Common Cancer and determine if she would like to reflex to the Multi-Cancer panel at the time of results disclosure.   The Common Hereditary Cancers Panel offered by [facility] includes sequencing and/or deletion duplication testing of the following 48 genes: APC, ATM, AXIN2, BAP1, BARD1, BMPR1A, BRCA1, BRCA2, BRIP1, CDH1, CDK4, CDKN2A (p14ARF and  p16INK4a only), CHEK2, CTNNA1, DICER1, EPCAM (Deletion/duplication testing only), FH, GREM1 (promoter region duplication testing only), HOXB13, KIT, MBD4, MEN1, MLH1, MSH2, MSH3, MSH6, MUTYH, NF1, NHTL1, PALB2, PDGFRA, PMS2, POLD1, POLE, PTEN, RAD51C, RAD51D, SDHA (sequencing analysis only except exon 14), SDHB, SDHC, SDHD, SMAD4, SMARCA4. STK11, TP53, TSC1, TSC2, and VHL.  Based on Marissa Reese's personal and family history of cancer, she meets medical criteria for genetic testing. Despite that she meets criteria, she may still have an out of pocket cost. We discussed that if her out of pocket cost for testing is over $100, the laboratory should contact them to discuss self-pay prices, patient pay assistance programs, if applicable, and other billing options.   PLAN: After considering the risks, benefits, and limitations, Marissa Reese provided informed consent to pursue genetic testing and the blood sample was sent to [facility] for analysis of the Common Cancer Panel. Results should be available  within approximately 2-3 weeks' time, at which point they will be disclosed by telephone to Marissa Reese, as will any additional recommendations warranted by these results. Marissa Reese will receive a summary of her genetic counseling visit and a copy of her results once available. This information will also be available in Epic.   Marissa Reese questions were answered to her satisfaction today. Our contact information was provided should additional questions or concerns arise. Thank you for the referral and allowing [place] to share in the care of your patient.   Lalla Brothers, MS, [facility] Genetic Counselor [email or web address] (P) [phone]  The patient was seen for a total of 20 minutes in audio genetic counseling. The patient was seen alone.  Drs. Pamelia Hoit and/or Mosetta Putt were available to discuss this case as needed.  _______________________________________________________________________ For Office Staff:  Number of people involved in session: 1 Was an Intern/ student involved with case: no

## 2022-10-19 NOTE — Progress Notes (Signed)
[facility] CONSULT NOTE  Patient Care Team: Roe Rutherford, NP as PCP - General (Adult Health Nurse Practitioner) Roe Rutherford, NP as Nurse Practitioner (Adult Health Nurse Practitioner)  CHIEF COMPLAINTS/PURPOSE OF CONSULTATION:  Newly diagnosed left breast DCIS.  HISTORY OF PRESENTING ILLNESS:  Marissa Reese 42 y.o. female is here because of recent diagnosis of left breast DCIS.  Patient had a palpable lump in the left breast about a month.  She underwent a mammogram and ultrasound that revealed pleomorphic suspicious calcifications in the left breast upper outer quadrant measuring 12 cm.  Stereotactic biopsy came back as intermediate grade DCIS that was ER positive and PR negative.  She was referred to [place] for discussion regarding adjuvant treatment options.  I reviewed her records extensively and collaborated the history with the patient.  SUMMARY OF ONCOLOGIC HISTORY: Oncology History  Ductal carcinoma in situ (DCIS) of left breast  09/24/2022 Initial Diagnosis   Palpable left breast lump for 1 month mammogram and ultrasound suspicious pleomorphic calcifications upper outer left breast 12 cm, axilla negative, stereotactic biopsy: Intermediate grade DCIS ER 95%, PR 0%   10/19/2022 Cancer Staging   Staging form: Breast, AJCC 8th Edition - Clinical: Stage 0 (cTis (DCIS), cN0, cM0, ER+, PR-, HER2: Not Assessed) - Signed by Serena Croissant, MD on 10/19/2022 Stage prefix: Initial diagnosis Nuclear grade: G2      MEDICAL HISTORY:  Past Medical History:  Diagnosis Date   Anxiety    Depression    Essential hypertension    Medication since 04/2015   History of headache    Hypertension    chronic, was on Lisinopril prior to preg.   Pregnant 07/17/2015   Vaginal itching 08/19/2015   Yeast infection 08/19/2015    SURGICAL HISTORY: Past Surgical History:  Procedure Laterality Date   BREAST BIOPSY Left 09/24/2022   MM LT BREAST BX W LOC DEV 1ST LESION IMAGE BX SPEC  STEREO GUIDE 09/24/2022 [facility]   BREAST BIOPSY Left 09/24/2022   MM LT BREAST BX W LOC DEV EA AD LESION IMG BX SPEC STEREO GUIDE 09/24/2022 [facility]   CHOLECYSTECTOMY      SOCIAL HISTORY: Social History   Socioeconomic History   Marital status: Married    Spouse name: Not on file   Number of children: Not on file   Years of education: Not on file   Highest education level: Not on file  Occupational History   Not on file  Tobacco Use   Smoking status: Former    Packs/day: 0.00    Years: 17.00    Additional pack years: 0.00    Total pack years: 0.00    Types: Cigarettes    Start date: 07/14/1991    Quit date: 07/13/2015    Years since quitting: 7.2   Smokeless tobacco: Never  Vaping Use   Vaping Use: Never used  Substance and Sexual Activity   Alcohol use: No    Alcohol/week: 0.0 standard drinks of alcohol   Drug use: No   Sexual activity: Yes    Birth control/protection: Coitus interruptus  Other Topics Concern   Not on file  Social History Narrative   Not on file   Social Determinants of Health   Financial Resource Strain: Not on file  Food Insecurity: Not on file  Transportation Needs: Not on file  Physical Activity: Not on file  Stress: Not on file  Social Connections: Not on file  Intimate Partner Violence: Not on file  FAMILY HISTORY: Family History  Problem Relation Age of Onset   Breast cancer Mother    Hypertension Mother    Hyperlipidemia Mother    Diabetes Father    Heart attack Father        Age 100   Hypertension Father    Heart attack Maternal Grandfather    Heart attack Paternal Grandmother    Stroke Paternal Grandmother    Heart attack Paternal Grandfather     ALLERGIES:  is allergic to zolpidem tartrate and latex.  MEDICATIONS:  Current Outpatient Medications  Medication Sig Dispense Refill   amphetamine-dextroamphetamine (ADDERALL) 30 MG tablet Take 1 tablet by mouth 2 (two) times daily.     atorvastatin  (LIPITOR) 10 MG tablet Take 10 mg by mouth 2 (two) times daily.     buPROPion (WELLBUTRIN XL) 300 MG 24 hr tablet Take 300 mg by mouth daily.     escitalopram (LEXAPRO) 10 MG tablet TAKE ONE (1) TABLET BY MOUTH EVERY DAY 30 tablet 1   ibuprofen (ADVIL) 200 MG tablet Take 200 mg by mouth every 6 (six) hours as needed.     lisinopril-hydrochlorothiazide (ZESTORETIC) 10-12.5 MG tablet Take 1 tablet by mouth daily.     ondansetron (ZOFRAN) 4 MG tablet Take 4 mg by mouth every 8 (eight) hours as needed.     promethazine (PHENERGAN) 25 MG suppository Place 1 suppository (25 mg total) rectally every 6 (six) hours as needed for nausea or vomiting. 12 each 1   No current facility-administered medications for this visit.    REVIEW OF SYSTEMS:   Constitutional: Denies fevers, chills or abnormal night sweats Breast: Palpable abnormality in the left breast All other systems were reviewed with the patient and are negative.  PHYSICAL EXAMINATION: ECOG PERFORMANCE STATUS: 0 - Asymptomatic  Vitals:   10/19/22 1305  BP: 128/78  Pulse: (!) 105  Resp: 18  Temp: (!) 97.5 F (36.4 C)  SpO2: 100%   Filed Weights   10/19/22 1305  Weight: 134 lb 9.6 oz (61.1 kg)    GENERAL:alert, no distress and comfortable     LABORATORY DATA:  I have reviewed the data as listed Lab Results  Component Value Date   WBC 12.6 (H) 09/04/2020   HGB 12.9 09/04/2020   HCT 40.8 09/04/2020   MCV 87.2 09/04/2020   PLT 428 (H) 09/04/2020   Lab Results  Component Value Date   NA 138 09/04/2020   K 3.3 (L) 09/04/2020   CL 105 09/04/2020   CO2 24 09/04/2020    RADIOGRAPHIC STUDIES: I have personally reviewed the radiological reports and agreed with the findings in the report.  ASSESSMENT AND PLAN:  Ductal carcinoma in situ (DCIS) of left breast 09/24/2022: Palpable left breast lump for 1 month mammogram and ultrasound suspicious pleomorphic calcifications upper outer left breast 12 cm, axilla negative,  stereotactic biopsy: Intermediate grade DCIS ER 95%, PR 0%  Pathology review: I discussed with the patient the difference between DCIS and invasive breast cancer. It is considered a precancerous lesion. DCIS is classified as a 0. It is generally detected through mammograms as calcifications. We discussed the significance of grades and its impact on prognosis. We also discussed the importance of ER and PR receptors and their implications to adjuvant treatment options. Prognosis of DCIS dependence on grade, comedo necrosis. It is anticipated that if not treated, 20-30% of DCIS can develop into invasive breast cancer.  Recommendation: Patient decided to do bilateral mastectomies I discussed with her if the  final pathology is DCIS that she does not need radiation and antiestrogen treatments Genetic counseling  Return to clinic after surgery to discuss the final pathology report and come up with an adjuvant treatment plan.   All questions were answered. The patient knows to call the clinic with any problems, questions or concerns.    Tamsen Meek, MD 10/19/22

## 2022-10-19 NOTE — Progress Notes (Signed)
Per MD request, referral placed for genetic counseling.

## 2022-10-20 ENCOUNTER — Encounter: Payer: Self-pay | Admitting: Genetic Counselor

## 2022-10-21 ENCOUNTER — Telehealth: Payer: Self-pay | Admitting: Hematology and Oncology

## 2022-10-21 NOTE — Telephone Encounter (Signed)
Scheduled appointment per 5/7 los. Left voicemail.

## 2022-10-27 ENCOUNTER — Encounter: Payer: Self-pay | Admitting: Genetic Counselor

## 2022-10-27 ENCOUNTER — Telehealth: Payer: Self-pay | Admitting: Genetic Counselor

## 2022-10-27 DIAGNOSIS — Z1379 Encounter for other screening for genetic and chromosomal anomalies: Secondary | ICD-10-CM | POA: Insufficient documentation

## 2022-10-27 NOTE — Telephone Encounter (Signed)
Contacted patient in attempt to disclose results of genetic testing.  LVM with contact information requesting a call back.  

## 2022-11-05 ENCOUNTER — Telehealth: Payer: Self-pay | Admitting: Genetic Counselor

## 2022-11-05 NOTE — Telephone Encounter (Signed)
I contacted Marissa Reese to discuss her genetic testing results. No pathogenic variants were identified in the 48 genes analyzed. Of note, a variant of uncertain significance was identified in the POLE gene. Detailed clinic note to follow.  The test report has been scanned into EPIC and is located under the Molecular Pathology section of the Results Review tab.  A portion of the result report is included below for reference.   Lalla Brothers, MS, [facility] Genetic Counselor [email or web address] (P) [phone]

## 2022-11-11 NOTE — Pre-Procedure Instructions (Signed)
Surgical Instructions    Your procedure is scheduled on Thursday, June 6th.  Report to [facility] Main Entrance "A" at 05:30 A.M., then check in with the Admitting office.  Call this number if you have problems the morning of surgery:  [phone]  If you have any questions prior to your surgery date call [phone]: Open Monday-Friday 8am-4pm If you experience any cold or flu symptoms such as cough, fever, chills, shortness of breath, etc. between now and your scheduled surgery, please notify us at the above number.     Remember:  Do not eat after midnight the night before your surgery  You may drink clear liquids until 04:30 AM the morning of your surgery.   Clear liquids allowed are: Water, Non-Citrus Juices (without pulp), Carbonated Beverages, Clear Tea, Black Coffee Only (NO MILK, CREAM OR POWDERED CREAMER of any kind), and Gatorade.    Take these medicines the morning of surgery with A SIP OF WATER  atorvastatin (LIPITOR)  buPROPion (WELLBUTRIN XL)  escitalopram (LEXAPRO)   If needed: ondansetron (ZOFRAN)   As of today, STOP taking any Aspirin (unless otherwise instructed by your surgeon) Aleve, Naproxen, Ibuprofen, Motrin, Advil, Goody's, BC's, all herbal medications, fish oil, and all vitamins.                     Do NOT Smoke (Tobacco/Vaping) for 24 hours prior to your procedure.  If you use a CPAP at night, you may bring your mask/headgear for your overnight stay.   Contacts, glasses, piercing's, hearing aid's, dentures or partials may not be worn into surgery, please bring cases for these belongings.    For patients admitted to the hospital, discharge time will be determined by your treatment team.   Patients discharged the day of surgery will not be allowed to drive home, and someone needs to stay with them for 24 hours.  SURGICAL WAITING ROOM VISITATION Patients having surgery or a procedure may have no more than 2 support people in the waiting area - these  visitors may rotate.   Children under the age of 36 must have an adult with them who is not the patient. If the patient needs to stay at the hospital during part of their recovery, the visitor guidelines for inpatient rooms apply. Pre-op nurse will coordinate an appropriate time for 1 support person to accompany patient in pre-op.  This support person may not rotate.   Please refer to the [facility] website for the visitor guidelines for Inpatients (after your surgery is over and you are in a regular room).    Special instructions:   [place]- Preparing For Surgery  Before surgery, you can play an important role. Because skin is not sterile, your skin needs to be as free of germs as possible. You can reduce the number of germs on your skin by washing with CHG (chlorahexidine gluconate) Soap before surgery.  CHG is an antiseptic cleaner which kills germs and bonds with the skin to continue killing germs even after washing.    Oral Hygiene is also important to reduce your risk of infection.  Remember - BRUSH YOUR TEETH THE MORNING OF SURGERY WITH YOUR REGULAR TOOTHPASTE  Please do not use if you have an allergy to CHG or antibacterial soaps. If your skin becomes reddened/irritated stop using the CHG.  Do not shave (including legs and underarms) for at least 48 hours prior to first CHG shower. It is OK to shave your face.  Please follow these  instructions carefully.   Shower the NIGHT BEFORE SURGERY and the MORNING OF SURGERY  If you chose to wash your hair, wash your hair first as usual with your normal shampoo.  After you shampoo, rinse your hair and body thoroughly to remove the shampoo.  Use CHG Soap as you would any other liquid soap. You can apply CHG directly to the skin and wash gently with a scrungie or a clean washcloth.   Apply the CHG Soap to your body ONLY FROM THE NECK DOWN.  Do not use on open wounds or open sores. Avoid contact with your eyes, ears, mouth and genitals  (private parts). Wash Face and genitals (private parts)  with your normal soap.   Wash thoroughly, paying special attention to the area where your surgery will be performed.  Thoroughly rinse your body with warm water from the neck down.  DO NOT shower/wash with your normal soap after using and rinsing off the CHG Soap.  Pat yourself dry with a CLEAN TOWEL.  Wear CLEAN PAJAMAS to bed the night before surgery  Place CLEAN SHEETS on your bed the night before your surgery  DO NOT SLEEP WITH PETS.   Day of Surgery: Take a shower with CHG soap. Do not wear jewelry or makeup Do not wear lotions, powders, perfumes, or deodorant. Do not shave 48 hours prior to surgery.   Do not bring valuables to the hospital. [facility] is not responsible for any belongings or valuables. Do not wear nail polish, gel polish, artificial nails, or any other type of covering on natural nails (fingers and toes) If you have artificial nails or gel coating that need to be removed by a nail salon, please have this removed prior to surgery. Artificial nails or gel coating may interfere with anesthesia's ability to adequately monitor your vital signs. Wear Clean/Comfortable clothing the morning of surgery Remember to brush your teeth WITH YOUR REGULAR TOOTHPASTE.   Please read over the following fact sheets that you were given.    If you received a COVID test during your pre-op visit  it is requested that you wear a mask when out in public, stay away from anyone that may not be feeling well and notify your surgeon if you develop symptoms. If you have been in contact with anyone that has tested positive in the last 10 days please notify you surgeon.

## 2022-11-12 ENCOUNTER — Encounter (HOSPITAL_COMMUNITY): Payer: Self-pay | Admitting: *Deleted

## 2022-11-12 ENCOUNTER — Encounter: Payer: Self-pay | Admitting: Genetic Counselor

## 2022-11-12 ENCOUNTER — Encounter (HOSPITAL_COMMUNITY)
Admission: RE | Admit: 2022-11-12 | Discharge: 2022-11-12 | Disposition: A | Discharge status: Home / Self Care | Payer: PRIVATE HEALTH INSURANCE | Source: Ambulatory Visit | Attending: Surgery | Admitting: Surgery

## 2022-11-12 ENCOUNTER — Other Ambulatory Visit: Payer: Self-pay

## 2022-11-12 ENCOUNTER — Ambulatory Visit: Payer: Self-pay | Admitting: Genetic Counselor

## 2022-11-12 VITALS — BP 145/95 | HR 90 | Temp 98.2°F | Resp 17 | Ht 60.0 in | Wt 137.8 lb

## 2022-11-12 DIAGNOSIS — Z01818 Encounter for other preprocedural examination: Secondary | ICD-10-CM | POA: Diagnosis not present

## 2022-11-12 DIAGNOSIS — Z1379 Encounter for other screening for genetic and chromosomal anomalies: Secondary | ICD-10-CM

## 2022-11-12 DIAGNOSIS — I10 Essential (primary) hypertension: Secondary | ICD-10-CM | POA: Diagnosis not present

## 2022-11-12 HISTORY — DX: Attention-deficit hyperactivity disorder, unspecified type: F90.9

## 2022-11-12 HISTORY — DX: Intraductal carcinoma in situ of left breast: D05.12

## 2022-11-12 LAB — BASIC METABOLIC PANEL
Anion gap: 7 (ref 5–15)
BUN: 8 mg/dL (ref 6–20)
CO2: 24 mmol/L (ref 22–32)
Calcium: 8.6 mg/dL — ABNORMAL LOW (ref 8.9–10.3)
Chloride: 102 mmol/L (ref 98–111)
Creatinine, Ser: 0.65 mg/dL (ref 0.44–1.00)
GFR, Estimated: >60 mL/min (ref >60)
Glucose, Bld: 93 mg/dL (ref 70–99)
Potassium: 3.5 mmol/L (ref 3.5–5.1)
Sodium: 133 mmol/L — ABNORMAL LOW (ref 135–145)

## 2022-11-12 LAB — CBC
HCT: 41 % (ref 36.0–46.0)
Hemoglobin: 12.8 g/dL (ref 12.0–15.0)
MCH: 27.7 pg (ref 26.0–34.0)
MCHC: 31.2 g/dL (ref 30.0–36.0)
MCV: 88.7 fL (ref 80.0–100.0)
Platelets: 416 10*3/uL — ABNORMAL HIGH (ref 150–400)
RBC: 4.62 MIL/uL (ref 3.87–5.11)
RDW: 15 % (ref 11.5–15.5)
WBC: 8 10*3/uL (ref 4.0–10.5)
nRBC: 0 % (ref 0.0–0.2)

## 2022-11-12 NOTE — Progress Notes (Signed)
HPI:   Marissa Reese was previously seen in the [facility] due to a personal and family history of cancer and concerns regarding a hereditary predisposition to cancer. Please refer to our prior cancer genetics clinic note for more information regarding our discussion, assessment and recommendations, at the time. Marissa Reese recent genetic test results were disclosed to her, as were recommendations warranted by these results. These results and recommendations are discussed in more detail below.  CANCER HISTORY:  Oncology History  Ductal carcinoma in situ (DCIS) of left breast  09/24/2022 Initial Diagnosis   Palpable left breast lump for 1 month mammogram and ultrasound suspicious pleomorphic calcifications upper outer left breast 12 cm, axilla negative, stereotactic biopsy: Intermediate grade DCIS ER 95%, PR 0%   10/19/2022 Cancer Staging   Staging form: Breast, AJCC 8th Edition - Clinical: Stage 0 (cTis (DCIS), cN0, cM0, ER+, PR-, HER2: Not Assessed) - Signed by Serena Croissant, MD on 10/19/2022 Stage prefix: Initial diagnosis Nuclear grade: G2   10/26/2022 Genetic Testing   Negative [facility] Common Hereditary Cancers +RNA Panel.  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 Report date is 10/26/2022.    The [facility] Common Hereditary Cancers + RNA Panel includes sequencing, deletion/duplication, and RNA analysis of the following 48 genes: APC, ATM, AXIN2, BAP1, BARD1, BMPR1A, BRCA1, BRCA2, BRIP1, CDH1, CDK4*, CDKN2A*, CHEK2, CTNNA1, DICER1, EPCAM* (del/dup only), FH, GREM1* (promoter dup analysis only), HOXB13*, KIT*, MBD4*, MEN1, MLH1, MSH2, MSH3, MSH6, MUTYH, NF1, NTHL1, PALB2, PDGFRA*, PMS2, POLD1, POLE, PTEN, RAD51C, RAD51D, SDHA (sequencing only), SDHB, SDHC, SDHD, SMAD4, SMARCA4, STK11, TP53, TSC1, TSC2, VHL.  *Genes without RNA analysis.      FAMILY HISTORY:  We obtained a detailed, 4-generation family history.  Significant diagnoses are listed below:      Family History   Problem Relation Age of Onset   Breast cancer Mother 53        bilateral   Hypertension Mother     Hyperlipidemia Mother     Diabetes Father     Heart attack Father          Age 78   Hypertension Father     Heart attack Maternal Grandfather     Pancreatic cancer Maternal Grandfather 71   Heart attack Paternal Grandmother     Stroke Paternal Grandmother     Heart attack Paternal Grandfather           Marissa Reese's mother was diagnosed with bilateral breast cancer at age 20, she reportedly had genetic testing but Marissa Reese does not know the results. Her maternal grandfather was diagnosed with pancreatic cancer at age 63, he died at age 25. There is no reported Ashkenazi Jewish ancestry.   GENETIC TEST RESULTS:  The [facility] Common Cancer Panel found no pathogenic mutations.  The Common Hereditary Cancers Panel offered by [facility] includes sequencing and/or deletion duplication testing of the following 48 genes: APC, ATM, AXIN2, BAP1, BARD1, BMPR1A, BRCA1, BRCA2, BRIP1, CDH1, CDK4, CDKN2A (p14ARF and p16INK4a only), CHEK2, CTNNA1, DICER1, EPCAM (Deletion/duplication testing only), FH, GREM1 (promoter region duplication testing only), HOXB13, KIT, MBD4, MEN1, MLH1, MSH2, MSH3, MSH6, MUTYH, NF1, NHTL1, PALB2, PDGFRA, PMS2, POLD1, POLE, PTEN, RAD51C, RAD51D, SDHA (sequencing analysis only except exon 14), SDHB, SDHC, SDHD, SMAD4, SMARCA4. STK11, TP53, TSC1, TSC2, and VHL.  The test report has been scanned into EPIC and is located under the Molecular Pathology section of the Results Review tab.  A portion of the result report is included below for reference. Genetic  testing reported out on 10/26/2022.       Genetic testing identified a variant of uncertain significance (VUS) in the POLE gene called c.5761A>G.  At this time, it is unknown if this variant is associated with an increased risk for cancer or if it is benign, but most uncertain variants are reclassified to benign. It should  not be used to make medical management decisions. With time, we suspect the laboratory will determine the significance of this variant, if any. If the laboratory reclassifies this variant, we will attempt to contact Marissa Reese to discuss it further.   Even though a pathogenic variant was not identified, possible explanations for the cancer in the family may include: There may be no hereditary risk for cancer in the family. The cancers in Marissa Reese and/or her family may be due to other genetic or environmental factors. There may be a gene mutation in one of these genes that current testing methods cannot detect, but that chance is small. There could be another gene that has not yet been discovered, or that we have not yet tested, that is responsible for the cancer diagnoses in the family.  It is also possible there is a hereditary cause for the cancer in the family that Marissa Reese did not inherit.  Therefore, it is important to remain in touch with cancer genetics in the future so that we can continue to offer Marissa Reese the most up to date genetic testing.   ADDITIONAL GENETIC TESTING:  We discussed with Marissa Reese that her genetic testing was fairly extensive.  If there are genes identified to increase cancer risk that can be analyzed in the future, we would be happy to discuss and coordinate this testing at that time.    CANCER SCREENING RECOMMENDATIONS:  Marissa Reese's test result is considered negative (normal).  This means that we have not identified a hereditary cause for her personal and family history of cancer at this time.   An individual's cancer risk and medical management are not determined by genetic test results alone. Overall cancer risk assessment incorporates additional factors, including personal medical history, family history, and any available genetic information that may result in a personalized plan for cancer prevention and surveillance. Therefore, it is  recommended she continue to follow the cancer management and screening guidelines provided by her oncology and primary healthcare provider.  RECOMMENDATIONS FOR FAMILY MEMBERS:   Since she did not inherit a mutation in a cancer predisposition gene included on this panel, her children could not have inherited a mutation from her in one of these genes. Individuals in this family might be at some increased risk of developing cancer, over the general population risk, due to the family history of cancer. We recommend women in this family have a yearly mammogram beginning at age 84, or 14 years younger than the earliest onset of cancer, an annual clinical breast exam, and perform monthly breast self-exams.  Other members of the family may still carry a pathogenic variant in one of these genes that Marissa Reese did not inherit. Based on the family history, we recommend her mother have genetic counseling and testing.  We do not recommend familial testing for the POLE variant of uncertain significance (VUS).  FOLLOW-UP:  Cancer genetics is a rapidly advancing field and it is possible that new genetic tests will be appropriate for her and/or her family members in the future. We encouraged her to remain in contact with cancer genetics on an annual basis so  we can update her personal and family histories and let her know of advances in cancer genetics that may benefit this family.   Our contact number was provided. Marissa Reese questions were answered to her satisfaction, and she knows she is welcome to call us at anytime with additional questions or concerns.   Lalla Brothers, MS, [facility] Genetic Counselor [email or web address] (P) [phone]

## 2022-11-12 NOTE — Progress Notes (Signed)
PCP - Roe Rutherford, NP Cardiologist - denies  PPM/ICD - denies   Chest x-ray - 07/07/15 EKG - 11/12/22 Stress Test - 07/18/15 ECHO - 07/18/15 Cardiac Cath - denies  Sleep Study - denies  DM- denies  ASA/Blood Thinner Instructions: n/a   ERAS Protcol - yes, no drink   COVID TEST- n/a   Anesthesia review: no  Patient denies shortness of breath, fever, cough and chest pain at PAT appointment   All instructions explained to the patient, with a verbal understanding of the material. Patient agrees to go over the instructions while at home for a better understanding.  The opportunity to ask questions was provided.

## 2022-11-15 NOTE — Progress Notes (Signed)
Patient Care Team: Roe Rutherford, NP as PCP - General (Adult Health Nurse Practitioner) Roe Rutherford, NP as Nurse Practitioner (Adult Health Nurse Practitioner) Pershing Proud, RN as Oncology Nurse Navigator Donnelly Angelica, RN as Oncology Nurse Navigator Serena Croissant, MD as Consulting Physician (Hematology and Oncology)  DIAGNOSIS:  Encounter Diagnosis  Name Primary?   Ductal carcinoma in situ (DCIS) of left breast Yes    SUMMARY OF ONCOLOGIC HISTORY: Oncology History  Ductal carcinoma in situ (DCIS) of left breast  09/24/2022 Initial Diagnosis   Palpable left breast lump for 1 month mammogram and ultrasound suspicious pleomorphic calcifications upper outer left breast 12 cm, axilla negative, stereotactic biopsy: Intermediate grade DCIS ER 95%, PR 0%   10/19/2022 Cancer Staging   Staging form: Breast, AJCC 8th Edition - Clinical: Stage 0 (cTis (DCIS), cN0, cM0, ER+, PR-, HER2: Not Assessed) - Signed by Serena Croissant, MD on 10/19/2022 Stage prefix: Initial diagnosis Nuclear grade: G2   10/26/2022 Genetic Testing   Negative [facility] Common Hereditary Cancers +RNA Panel.  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 Report date is 10/26/2022.    The [facility] Common Hereditary Cancers + RNA Panel includes sequencing, deletion/duplication, and RNA analysis of the following 48 genes: APC, ATM, AXIN2, BAP1, BARD1, BMPR1A, BRCA1, BRCA2, BRIP1, CDH1, CDK4*, CDKN2A*, CHEK2, CTNNA1, DICER1, EPCAM* (del/dup only), FH, GREM1* (promoter dup analysis only), HOXB13*, KIT*, MBD4*, MEN1, MLH1, MSH2, MSH3, MSH6, MUTYH, NF1, NTHL1, PALB2, PDGFRA*, PMS2, POLD1, POLE, PTEN, RAD51C, RAD51D, SDHA (sequencing only), SDHB, SDHC, SDHD, SMAD4, SMARCA4, STK11, TP53, TSC1, TSC2, VHL.  *Genes without RNA analysis.    11/18/2022 Surgery   Bilateral mastectomies Left mastectomy: Grade 2-3 DCIS micropapillary and cribriform types with comedonecrosis measuring 12 cm, margins negative, 0/1 lymph node, ER 95%, PR  0% Right mastectomy: Benign     CHIEF COMPLIANT: Follow-up after surgery  INTERVAL HISTORY: MALENY SPEARMAN is a 42 y.o. female is here because of recent diagnosis of left breast DCIS.  She underwent bilateral mastectomies and is here to discuss her pathology report.  She is healing and recovering very well from surgery.  Her only complaint is her drains.  She is unable to sleep well because of the drains.  Overall the amount of fluid from the drains has come down.   ALLERGIES:  is allergic to zolpidem tartrate and latex.  MEDICATIONS:  Current Outpatient Medications  Medication Sig Dispense Refill   acetaminophen (TYLENOL) 500 MG tablet Take 1,000 mg by mouth as needed for moderate pain.     amphetamine-dextroamphetamine (ADDERALL) 30 MG tablet Take 30 mg by mouth 2 (two) times daily.     buPROPion (WELLBUTRIN XL) 150 MG 24 hr tablet Take 150 mg by mouth daily.     escitalopram (LEXAPRO) 10 MG tablet TAKE ONE (1) TABLET BY MOUTH EVERY DAY (Patient taking differently: Take 10 mg by mouth daily.) 30 tablet 1   ibuprofen (ADVIL) 200 MG tablet Take 800 mg by mouth as needed for moderate pain.     lisinopril-hydrochlorothiazide (ZESTORETIC) 10-12.5 MG tablet Take 1 tablet by mouth daily.     ondansetron (ZOFRAN) 4 MG tablet Take 4 mg by mouth as needed for vomiting or nausea.     oxyCODONE (OXY IR/ROXICODONE) 5 MG immediate release tablet Take 1 tablet (5 mg total) by mouth every 6 (six) hours as needed for severe pain. 15 tablet 0   promethazine (PHENERGAN) 25 MG suppository Place 1 suppository (25 mg total) rectally every 6 (six) hours as needed for  nausea or vomiting. (Patient taking differently: Place 25 mg rectally as needed for nausea or vomiting.) 12 each 1   rizatriptan (MAXALT) 10 MG tablet Take 10 mg by mouth as needed for migraine.     No current facility-administered medications for this visit.    PHYSICAL EXAMINATION: ECOG PERFORMANCE STATUS: 1 - Symptomatic but completely  ambulatory  Vitals:   11/29/22 1534  BP: (!) 145/97  Pulse: 84  Resp: 18  Temp: 97.9 F (36.6 C)  SpO2: 100%   Filed Weights   11/29/22 1534  Weight: 136 lb 8 oz (61.9 kg)      LABORATORY DATA:  I have reviewed the data as listed    Latest Ref Rng & Units 11/12/2022    9:48 AM 09/04/2020    7:25 PM 09/04/2020    7:04 PM  CMP  Glucose 70 - 99 mg/dL 93   161   BUN 6 - 20 mg/dL 8   11   Creatinine 0.96 - 1.00 mg/dL 0.45   4.09   Sodium 811 - 145 mmol/L 133   138   Potassium 3.5 - 5.1 mmol/L 3.5   3.3   Chloride 98 - 111 mmol/L 102   105   CO2 22 - 32 mmol/L 24   24   Calcium 8.9 - 10.3 mg/dL 8.6   8.4   Total Protein 6.5 - 8.1 g/dL  6.5    Total Bilirubin 0.3 - 1.2 mg/dL  0.9    Alkaline Phos 38 - 126 U/L  105    AST 15 - 41 U/L  116    ALT 0 - 44 U/L  50      Lab Results  Component Value Date   WBC 8.0 11/12/2022   HGB 12.8 11/12/2022   HCT 41.0 11/12/2022   MCV 88.7 11/12/2022   PLT 416 (H) 11/12/2022   NEUTROABS 11.5 (H) 09/04/2020    ASSESSMENT & PLAN:  Ductal carcinoma in situ (DCIS) of left breast 11/18/2022: Bilateral mastectomies:  Left mastectomy: Grade 2-3 DCIS micropapillary and cribriform types with comedonecrosis measuring 12 cm, margins negative, 0/1 lymph node, ER 95%, PR 0% Right mastectomy: Benign  Pathology counseling: I discussed the final pathology report of the patient provided  a copy of this report. I discussed the margins.  We also discussed the final staging along with previously performed ER/PR testing.  Treatment plan: No role of any additional systemic therapy since she had bilateral mastectomies and final pathology was DCIS.  Return to clinic on an as-needed basis.  No orders of the defined types were placed in this encounter.  The patient has a good understanding of the overall plan. she agrees with it. she will call with any problems that may develop before the next visit here. Total time spent: 30 mins including face to face  time and time spent for planning, charting and co-ordination of care   Tamsen Meek, MD 11/29/22    I Janan Ridge am acting as a Neurosurgeon for [employer]  I have reviewed the above documentation for accuracy and completeness, and I agree with the above.

## 2022-11-17 NOTE — Anesthesia Preprocedure Evaluation (Signed)
Anesthesia Evaluation  Patient identified by MRN, date of birth, ID band Patient awake    Reviewed: Allergy & Precautions, NPO status , Patient's Chart, lab work & pertinent test results  History of Anesthesia Complications Negative for: history of anesthetic complications  Airway Mallampati: III  TM Distance: >3 FB Neck ROM: Full    Dental  (+) Dental Advisory Given   Pulmonary neg shortness of breath, neg sleep apnea, neg COPD, neg recent URI, Current Smoker and Patient abstained from smoking.   Pulmonary exam normal breath sounds clear to auscultation       Cardiovascular hypertension, (-) angina (-) Past MI, (-) Cardiac Stents and (-) CABG (-) dysrhythmias  Rhythm:Regular Rate:Normal     Neuro/Psych  PSYCHIATRIC DISORDERS (ADHD) Anxiety Depression    negative neurological ROS     GI/Hepatic negative GI ROS, Neg liver ROS,,,  Endo/Other  negative endocrine ROS    Renal/GU negative Renal ROS     Musculoskeletal   Abdominal   Peds  Hematology negative hematology ROS (+)   Anesthesia Other Findings Left breast cancer  Reproductive/Obstetrics                             Anesthesia Physical Anesthesia Plan  ASA: 2  Anesthesia Plan: General   Post-op Pain Management: Regional block* and Tylenol PO (pre-op)*   Induction: Intravenous  PONV Risk Score and Plan: 2 and Ondansetron and Dexamethasone  Airway Management Planned: LMA  Additional Equipment:   Intra-op Plan:   Post-operative Plan: Extubation in OR  Informed Consent: I have reviewed the patients History and Physical, chart, labs and discussed the procedure including the risks, benefits and alternatives for the proposed anesthesia with the patient or authorized representative who has indicated his/her understanding and acceptance.     Dental advisory given  Plan Discussed with: Anesthesiologist and CRNA  Anesthesia  Plan Comments: (Discussed potential risks of nerve blocks including, but not limited to, infection, bleeding, nerve damage, seizures, pneumothorax, respiratory depression, and potential failure of the block. Alternatives to nerve blocks discussed. All questions answered.  Risks of general anesthesia discussed including, but not limited to, sore throat, hoarse voice, chipped/damaged teeth, injury to vocal cords, nausea and vomiting, allergic reactions, lung infection, heart attack, stroke, and death. All questions answered. )       Anesthesia Quick Evaluation

## 2022-11-17 NOTE — H&P (Signed)
Chief Complaint: New Consultation ( LEFT breast cancer)  History of Present Illness: Marissa Reese is a 42 y.o. female who is seen today as an office consultation for evaluation of New Consultation ( LEFT breast cancer)  The patient presents for evaluation of left breast DCIS diagnosed by examination and subsequent mammogram with core biopsy. She noticed an area of thickness in February of her left lateral breast. She was seen by her primary care doctor where mammogram revealed 12 cm area of pleomorphic calcifications in the upper outer quadrant left breast extending to the nipple. Core biopsy of to these areas showed intermediate grade DCIS ER positive PR negative. Her mother had breast cancer in her 8s and had bilateral mastectomies. She complains of soreness at the site and some fullness in the left lateral breast. Denies any issues with the right breast.  Review of Systems: A complete review of systems was obtained from the patient. I have reviewed this information and discussed as appropriate with the patient. See HPI as well for other ROS.    Medical History: Past Medical History:  Diagnosis Date  Anxiety  Arthritis  History of cancer  Hypertension   There is no problem list on file for this patient.  Past Surgical History:  Procedure Laterality Date  CHOLECYSTECTOMY 04/2014    Allergies  Allergen Reactions  Latex Itching and Rash  Zolpidem Tartrate Other (See Comments)  Reaction: Hallucinations   Reaction: Hallucinations   Current Outpatient Medications on File Prior to Visit  Medication Sig Dispense Refill  buPROPion (WELLBUTRIN XL) 150 MG XL tablet Take by mouth  dextroamphetamine-amphetamine (ADDERALL) 30 mg tablet TAKE ONE TABLET BY MOUTH TWICE A DAY MAX DAILY AMOUNT: 2 TABLETS  escitalopram oxalate (LEXAPRO) 10 MG tablet TAKE ONE TABLET (10MG  DOSE) BY MOUTH DAILY  lisinopriL-hydroCHLOROthiazide (ZESTORETIC) 10-12.5 mg tablet Take 1 tablet by mouth once daily    No current facility-administered medications on file prior to visit.   History reviewed. No pertinent family history.   Social History   Tobacco Use  Smoking Status Never  Smokeless Tobacco Never    Social History   Socioeconomic History  Marital status: Married  Tobacco Use  Smoking status: Never  Smokeless tobacco: Never  Substance and Sexual Activity  Alcohol use: Yes  Drug use: Never   Social Determinants of Health   Financial Resource Strain: Low Risk (08/16/2022)  Received from [facility]  Overall Financial Resource Strain (CARDIA)  Difficulty of Paying Living Expenses: Not very hard  Food Insecurity: No Food Insecurity (08/16/2022)  Received from [facility]  Hunger Vital Sign  Worried About Running Out of Food in the Last Year: Never true  Ran Out of Food in the Last Year: Never true  Transportation Needs: No Transportation Needs (08/16/2022)  Received from [facility] - Transportation  Lack of Transportation (Medical): No  Lack of Transportation (Non-Medical): No  Physical Activity: Sufficiently Active (08/16/2022)  Received from [facility]  Exercise Vital Sign  Days of Exercise per Week: 5 days  Minutes of Exercise per Session: 30 min  Stress: Stress Concern Present (08/16/2022)  Received from [facility] - Occupational Stress Questionnaire  Feeling of Stress : To some extent  Social Connections: Moderately Integrated (08/16/2022)  Received from [facility]  Social Network  How would you rate your social network (family, work, friends)?: Adequate participation with social networks   Objective:   Vitals:  10/01/22 1025 10/01/22 1032  BP: 126/87  Pulse: 101  Temp: 36.8 C (98.3 F)  SpO2: 96%  Weight: 62 kg (136 lb 9.6 oz)  Height: 152.4 cm (5')  PainSc: 0-No pain  PainLoc: Breast   Body mass index is 26.68 kg/m.  Physical Exam Exam conducted with a chaperone present.  HENT:  Head:  Normocephalic.  Cardiovascular:  Rate and Rhythm: Normal rate.  Pulmonary:  Effort: Pulmonary effort is normal.  Chest:  Breasts: Right: Normal. No inverted nipple, mass, nipple discharge or skin change.  Left: No inverted nipple, mass, nipple discharge or skin change.   Comments: Fullness left breast upper outer quadrant Musculoskeletal:  Cervical back: Normal range of motion.  Lymphadenopathy:  Upper Body:  Right upper body: No supraclavicular or axillary adenopathy.  Left upper body: No supraclavicular or axillary adenopathy.  Skin: General: Skin is warm.  Neurological:  General: No focal deficit present.  Mental Status: She is alert.  Psychiatric:  Mood and Affect: Mood normal.     Labs, Imaging and Diagnostic Testing:  CLINICAL DATA: 42 year old female with a palpable left breast lump for 1 month.  EXAM: DIGITAL DIAGNOSTIC BILATERAL MAMMOGRAM WITH TOMOSYNTHESIS; ULTRASOUND LEFT BREAST LIMITED  TECHNIQUE: Bilateral digital diagnostic mammography and breast tomosynthesis was performed.; Targeted ultrasound examination of the left breast was performed.  COMPARISON: None available.  ACR Breast Density Category c: The breasts are heterogeneously dense, which may obscure small masses.  FINDINGS: Pleomorphic calcifications are demonstrated in the upper outer quadrant in superior central left breast from anterior to posterior depth. Overall, the calcifications span approximately 12 x 11 cm. The patient's palpable lump correlates with the extent of calcifications within the upper outer quadrant.  No suspicious mammographic findings within the right breast.  Targeted ultrasound is performed, showing punctate echogenic foci throughout the upper and upper outer left breast consistent with focal calcifications. There is associated shadowing but no discrete mass is definitively identified during real-time scanning.  Evaluation of the left axilla demonstrates no  suspicious lymphadenopathy.  IMPRESSION: 1. Suspicious, pleomorphic calcifications involving the upper in upper-outer left breast spanning at least 12 cm. Recommendation is for stereotactic biopsies. 2. No suspicious left axillary lymphadenopathy. 3. No mammographic evidence of malignancy on the right.  RECOMMENDATION: Two area stereotactic biopsy of the left breast.  I have discussed the findings and recommendations with the patient. If applicable, a reminder letter will be sent to the patient regarding the next appointment.  BI-RADS CATEGORY 4: Suspicious.   Electronically Signed By: Sande Brothers M.D. On: 09/07/2022 16:28 Assessment and Plan:   Diagnoses and all orders for this visit:  Ductal carcinoma in situ (DCIS) of left breast - Ambulatory Referral to Oncology-Medical - Ambulatory Referral to Plastic Surgery - Ambulatory Referral to Cancer Genetics - [id number]   Reviewed pathophysiology of DCIS. Reviewed her family history.  Given the large area, her best option would be left simple mastectomy. She is considering risk reducing right simple mastectomy given her mother had breast cancer as well. She will see a Dentist, plastic surgery, and the cancer center to review this with medical and radiation oncology. Plan will be for bilateral simple mastectomy and the use of mag trace if she requires lymph node mapping depending on final pathology on the left. Risk of mastectomy include bleeding, infection, cosmetic deformity, skin loss, lymphedema of lymph nodes were removed, shoulder pain, shoulder stiffness, and the need for the treatment standard procedures.   Hayden Rasmussen, MD

## 2022-11-18 ENCOUNTER — Ambulatory Visit (HOSPITAL_BASED_OUTPATIENT_CLINIC_OR_DEPARTMENT_OTHER): Payer: PRIVATE HEALTH INSURANCE | Admitting: Anesthesiology

## 2022-11-18 ENCOUNTER — Other Ambulatory Visit: Payer: Self-pay

## 2022-11-18 ENCOUNTER — Encounter (HOSPITAL_COMMUNITY): Payer: Self-pay | Admitting: Surgery

## 2022-11-18 ENCOUNTER — Ambulatory Visit (HOSPITAL_COMMUNITY): Payer: PRIVATE HEALTH INSURANCE | Admitting: Anesthesiology

## 2022-11-18 ENCOUNTER — Observation Stay (HOSPITAL_COMMUNITY)
Admission: RE | Admit: 2022-11-18 | Discharge: 2022-11-19 | Disposition: A | Discharge status: Home / Self Care | Payer: PRIVATE HEALTH INSURANCE | Attending: Surgery | Admitting: Surgery

## 2022-11-18 ENCOUNTER — Encounter (HOSPITAL_COMMUNITY)
Admission: RE | Disposition: A | Discharge status: Home / Self Care | Payer: Self-pay | Source: Home / Self Care | Attending: Surgery

## 2022-11-18 DIAGNOSIS — I10 Essential (primary) hypertension: Secondary | ICD-10-CM | POA: Insufficient documentation

## 2022-11-18 DIAGNOSIS — Z803 Family history of malignant neoplasm of breast: Secondary | ICD-10-CM

## 2022-11-18 DIAGNOSIS — D0512 Intraductal carcinoma in situ of left breast: Secondary | ICD-10-CM | POA: Diagnosis not present

## 2022-11-18 DIAGNOSIS — D241 Benign neoplasm of right breast: Secondary | ICD-10-CM | POA: Diagnosis not present

## 2022-11-18 DIAGNOSIS — Z91040 Latex allergy status: Secondary | ICD-10-CM | POA: Insufficient documentation

## 2022-11-18 DIAGNOSIS — Z01818 Encounter for other preprocedural examination: Secondary | ICD-10-CM

## 2022-11-18 DIAGNOSIS — Z79899 Other long term (current) drug therapy: Secondary | ICD-10-CM | POA: Insufficient documentation

## 2022-11-18 DIAGNOSIS — F17210 Nicotine dependence, cigarettes, uncomplicated: Secondary | ICD-10-CM

## 2022-11-18 HISTORY — PX: SIMPLE MASTECTOMY WITH AXILLARY SENTINEL NODE BIOPSY: SHX6098

## 2022-11-18 LAB — POCT PREGNANCY, URINE: Preg Test, Ur: NEGATIVE

## 2022-11-18 SURGERY — SIMPLE MASTECTOMY
Anesthesia: General | Site: Breast | Laterality: Bilateral

## 2022-11-18 MED ORDER — OXYCODONE HCL 5 MG PO TABS
ORAL_TABLET | ORAL | Status: AC
  Filled 2022-11-18: qty 2

## 2022-11-18 MED ORDER — AMPHETAMINE-DEXTROAMPHETAMINE 10 MG PO TABS
30.0000 mg | ORAL_TABLET | Freq: Two times a day (BID) | ORAL | Status: DC
  Filled 2022-11-18: qty 3

## 2022-11-18 MED ORDER — DEXTROSE-SODIUM CHLORIDE 5-0.9 % IV SOLN: INTRAVENOUS | Status: DC

## 2022-11-18 MED ORDER — BUPIVACAINE HCL (PF) 0.25 % IJ SOLN
INTRAMUSCULAR | Status: DC | PRN
  Administered 2022-11-18 (×2): 30 mL via PERINEURAL

## 2022-11-18 MED ORDER — OXYCODONE HCL 5 MG PO TABS: 5.0000 mg | ORAL_TABLET | Freq: Once | ORAL | Status: DC | PRN

## 2022-11-18 MED ORDER — METOPROLOL TARTRATE 5 MG/5ML IV SOLN: 5.0000 mg | Freq: Four times a day (QID) | INTRAVENOUS | Status: DC | PRN

## 2022-11-18 MED ORDER — PROPOFOL 10 MG/ML IV BOLUS
INTRAVENOUS | Status: DC | PRN
  Administered 2022-11-18: 100 mg via INTRAVENOUS
  Administered 2022-11-18 (×2): 50 mg via INTRAVENOUS

## 2022-11-18 MED ORDER — OXYCODONE HCL 5 MG/5ML PO SOLN: 5.0000 mg | Freq: Once | ORAL | Status: DC | PRN

## 2022-11-18 MED ORDER — ONDANSETRON HCL 4 MG/2ML IJ SOLN: 4.0000 mg | Freq: Four times a day (QID) | INTRAMUSCULAR | Status: DC | PRN

## 2022-11-18 MED ORDER — ENSURE ENLIVE PO LIQD: 237.0000 mL | Freq: Two times a day (BID) | ORAL | Status: DC

## 2022-11-18 MED ORDER — ORAL CARE MOUTH RINSE: 15.0000 mL | Freq: Once | OROMUCOSAL | Status: AC

## 2022-11-18 MED ORDER — LISINOPRIL 10 MG PO TABS
10.0000 mg | ORAL_TABLET | Freq: Every day | ORAL | Status: DC
  Filled 2022-11-18 (×2): qty 1

## 2022-11-18 MED ORDER — FENTANYL CITRATE (PF) 250 MCG/5ML IJ SOLN
INTRAMUSCULAR | Status: DC | PRN
  Administered 2022-11-18: 50 ug via INTRAVENOUS
  Administered 2022-11-18 (×2): 25 ug via INTRAVENOUS
  Administered 2022-11-18: 100 ug via INTRAVENOUS

## 2022-11-18 MED ORDER — TRAMADOL HCL 50 MG PO TABS: 50.0000 mg | ORAL_TABLET | Freq: Four times a day (QID) | ORAL | Status: DC | PRN

## 2022-11-18 MED ORDER — HYDROMORPHONE HCL 1 MG/ML IJ SOLN: 1.0000 mg | INTRAMUSCULAR | Status: DC | PRN

## 2022-11-18 MED ORDER — PROPOFOL 10 MG/ML IV BOLUS
INTRAVENOUS | Status: AC
  Filled 2022-11-18: qty 20

## 2022-11-18 MED ORDER — MIDAZOLAM HCL 2 MG/2ML IJ SOLN
INTRAMUSCULAR | Status: AC
  Filled 2022-11-18: qty 2

## 2022-11-18 MED ORDER — OXYCODONE HCL 5 MG PO TABS
5.0000 mg | ORAL_TABLET | ORAL | Status: DC | PRN
  Administered 2022-11-18 – 2022-11-19 (×5): 10 mg via ORAL
  Filled 2022-11-18 (×5): qty 2

## 2022-11-18 MED ORDER — FENTANYL CITRATE (PF) 100 MCG/2ML IJ SOLN
25.0000 ug | INTRAMUSCULAR | Status: DC | PRN
  Administered 2022-11-18 (×2): 50 ug via INTRAVENOUS

## 2022-11-18 MED ORDER — CHLORHEXIDINE GLUCONATE CLOTH 2 % EX PADS: 6.0000 | MEDICATED_PAD | Freq: Once | CUTANEOUS | Status: DC

## 2022-11-18 MED ORDER — CEFAZOLIN SODIUM-DEXTROSE 2-4 GM/100ML-% IV SOLN
2.0000 g | INTRAVENOUS | Status: AC
  Administered 2022-11-18: 2 g via INTRAVENOUS

## 2022-11-18 MED ORDER — CEFAZOLIN SODIUM-DEXTROSE 2-4 GM/100ML-% IV SOLN
2.0000 g | Freq: Three times a day (TID) | INTRAVENOUS | Status: AC
  Administered 2022-11-18: 2 g via INTRAVENOUS
  Filled 2022-11-18: qty 100

## 2022-11-18 MED ORDER — BUPROPION HCL ER (XL) 150 MG PO TB24
150.0000 mg | ORAL_TABLET | Freq: Every day | ORAL | Status: DC
  Administered 2022-11-19: 150 mg via ORAL
  Filled 2022-11-18 (×2): qty 1

## 2022-11-18 MED ORDER — OXYCODONE HCL 5 MG PO TABS
ORAL_TABLET | ORAL | Status: AC
  Filled 2022-11-18: qty 1

## 2022-11-18 MED ORDER — CHLORHEXIDINE GLUCONATE 0.12 % MT SOLN
15.0000 mL | Freq: Once | OROMUCOSAL | Status: AC
  Administered 2022-11-18: 15 mL via OROMUCOSAL

## 2022-11-18 MED ORDER — SUMATRIPTAN SUCCINATE 50 MG PO TABS: 50.0000 mg | ORAL_TABLET | ORAL | Status: DC | PRN

## 2022-11-18 MED ORDER — LIDOCAINE 2% (20 MG/ML) 5 ML SYRINGE
INTRAMUSCULAR | Status: DC | PRN
  Administered 2022-11-18: 60 mg via INTRAVENOUS

## 2022-11-18 MED ORDER — TRANEXAMIC ACID 1000 MG/10ML IV SOLN
2000.0000 mg | Freq: Once | INTRAVENOUS | Status: AC
  Administered 2022-11-18: 2000 mg via TOPICAL
  Filled 2022-11-18 (×2): qty 20

## 2022-11-18 MED ORDER — ONDANSETRON 4 MG PO TBDP: 4.0000 mg | ORAL_TABLET | Freq: Four times a day (QID) | ORAL | Status: DC | PRN

## 2022-11-18 MED ORDER — LISINOPRIL-HYDROCHLOROTHIAZIDE 10-12.5 MG PO TABS: 1.0000 | ORAL_TABLET | Freq: Every day | ORAL | Status: DC

## 2022-11-18 MED ORDER — ONDANSETRON HCL 4 MG/2ML IJ SOLN
INTRAMUSCULAR | Status: DC | PRN
  Administered 2022-11-18: 4 mg via INTRAVENOUS

## 2022-11-18 MED ORDER — AMISULPRIDE (ANTIEMETIC) 5 MG/2ML IV SOLN: 10.0000 mg | Freq: Once | INTRAVENOUS | Status: DC | PRN

## 2022-11-18 MED ORDER — FENTANYL CITRATE (PF) 250 MCG/5ML IJ SOLN
INTRAMUSCULAR | Status: AC
  Filled 2022-11-18: qty 5

## 2022-11-18 MED ORDER — TRANEXAMIC ACID-NACL 1000-0.7 MG/100ML-% IV SOLN
INTRAVENOUS | Status: AC
  Filled 2022-11-18: qty 100

## 2022-11-18 MED ORDER — PHENYLEPHRINE 80 MCG/ML (10ML) SYRINGE FOR IV PUSH (FOR BLOOD PRESSURE SUPPORT)
PREFILLED_SYRINGE | INTRAVENOUS | Status: DC | PRN
  Administered 2022-11-18: 160 ug via INTRAVENOUS
  Administered 2022-11-18 (×2): 80 ug via INTRAVENOUS
  Administered 2022-11-18: 160 ug via INTRAVENOUS
  Administered 2022-11-18: 80 ug via INTRAVENOUS

## 2022-11-18 MED ORDER — ENOXAPARIN SODIUM 40 MG/0.4ML IJ SOSY
40.0000 mg | PREFILLED_SYRINGE | INTRAMUSCULAR | Status: DC
  Administered 2022-11-19: 40 mg via SUBCUTANEOUS
  Filled 2022-11-18: qty 0.4

## 2022-11-18 MED ORDER — 0.9 % SODIUM CHLORIDE (POUR BTL) OPTIME
TOPICAL | Status: DC | PRN
  Administered 2022-11-18: 1000 mL

## 2022-11-18 MED ORDER — CEFAZOLIN SODIUM-DEXTROSE 2-4 GM/100ML-% IV SOLN
INTRAVENOUS | Status: AC
  Filled 2022-11-18: qty 100

## 2022-11-18 MED ORDER — DEXAMETHASONE SODIUM PHOSPHATE 10 MG/ML IJ SOLN
INTRAMUSCULAR | Status: DC | PRN
  Administered 2022-11-18: 10 mg via INTRAVENOUS

## 2022-11-18 MED ORDER — ACETAMINOPHEN 500 MG PO TABS
ORAL_TABLET | ORAL | Status: AC
  Filled 2022-11-18: qty 2

## 2022-11-18 MED ORDER — MIDAZOLAM HCL 2 MG/2ML IJ SOLN
INTRAMUSCULAR | Status: DC | PRN
  Administered 2022-11-18: 2 mg via INTRAVENOUS

## 2022-11-18 MED ORDER — METHOCARBAMOL 500 MG PO TABS
500.0000 mg | ORAL_TABLET | Freq: Four times a day (QID) | ORAL | Status: DC | PRN
  Administered 2022-11-18: 500 mg via ORAL
  Filled 2022-11-18 (×2): qty 1

## 2022-11-18 MED ORDER — ESCITALOPRAM OXALATE 10 MG PO TABS
10.0000 mg | ORAL_TABLET | Freq: Every day | ORAL | Status: DC
  Administered 2022-11-18: 10 mg via ORAL
  Filled 2022-11-18 (×2): qty 1

## 2022-11-18 MED ORDER — FENTANYL CITRATE (PF) 100 MCG/2ML IJ SOLN
INTRAMUSCULAR | Status: AC
  Filled 2022-11-18: qty 2

## 2022-11-18 MED ORDER — ACETAMINOPHEN 500 MG PO TABS
1000.0000 mg | ORAL_TABLET | Freq: Once | ORAL | Status: AC
  Administered 2022-11-18: 1000 mg via ORAL

## 2022-11-18 MED ORDER — CHLORHEXIDINE GLUCONATE 0.12 % MT SOLN
OROMUCOSAL | Status: AC
  Filled 2022-11-18: qty 15

## 2022-11-18 MED ORDER — LACTATED RINGERS IV SOLN: INTRAVENOUS | Status: DC

## 2022-11-18 MED ORDER — HYDROCHLOROTHIAZIDE 12.5 MG PO TABS
12.5000 mg | ORAL_TABLET | Freq: Every day | ORAL | Status: DC
  Filled 2022-11-18 (×2): qty 1

## 2022-11-18 SURGICAL SUPPLY — 42 items
ADH SKN CLS APL DERMABOND .7 (GAUZE/BANDAGES/DRESSINGS) ×2
ADH SKN CLS LQ APL DERMABOND (GAUZE/BANDAGES/DRESSINGS) ×2
APL PRP STRL LF DISP 70% ISPRP (MISCELLANEOUS) ×1
APPLIER CLIP 9.375 MED OPEN (MISCELLANEOUS) ×1
APR CLP MED 9.3 20 MLT OPN (MISCELLANEOUS) ×1
BAG COUNTER SPONGE SURGICOUNT (BAG) ×1 IMPLANT
BAG SPNG CNTER NS LX DISP (BAG) ×1
BINDER ABDOMINAL 12 XL 75-84 (SOFTGOODS) IMPLANT
BINDER BREAST LRG (GAUZE/BANDAGES/DRESSINGS) IMPLANT
BINDER BREAST XLRG (GAUZE/BANDAGES/DRESSINGS) IMPLANT
CANISTER SUCT 3000ML PPV (MISCELLANEOUS) ×1 IMPLANT
CHLORAPREP W/TINT 26 (MISCELLANEOUS) ×1 IMPLANT
CLIP APPLIE 9.375 MED OPEN (MISCELLANEOUS) ×1 IMPLANT
COVER SURGICAL LIGHT HANDLE (MISCELLANEOUS) ×1 IMPLANT
DERMABOND ADVANCED .7 DNX12 (GAUZE/BANDAGES/DRESSINGS) ×1 IMPLANT
DERMABOND ADVANCED .7 DNX6 (GAUZE/BANDAGES/DRESSINGS) IMPLANT
DRAIN CHANNEL 19F RND (DRAIN) ×1 IMPLANT
DRAPE LAPAROSCOPIC ABDOMINAL (DRAPES) ×1 IMPLANT
ELECT REM PT RETURN 9FT ADLT (ELECTROSURGICAL) ×1
ELECTRODE REM PT RTRN 9FT ADLT (ELECTROSURGICAL) ×1 IMPLANT
EVACUATOR SILICONE 100CC (DRAIN) ×1 IMPLANT
GAUZE SPONGE 4X4 12PLY STRL (GAUZE/BANDAGES/DRESSINGS) ×1 IMPLANT
GLOVE BIO SURGEON STRL SZ8 (GLOVE) ×1 IMPLANT
GLOVE BIOGEL PI IND STRL 8 (GLOVE) ×1 IMPLANT
GOWN STRL REUS W/ TWL LRG LVL3 (GOWN DISPOSABLE) ×2 IMPLANT
GOWN STRL REUS W/ TWL XL LVL3 (GOWN DISPOSABLE) ×1 IMPLANT
GOWN STRL REUS W/TWL LRG LVL3 (GOWN DISPOSABLE) ×2
GOWN STRL REUS W/TWL XL LVL3 (GOWN DISPOSABLE) ×1
KIT BASIN OR (CUSTOM PROCEDURE TRAY) ×1 IMPLANT
KIT TURNOVER KIT B (KITS) ×1 IMPLANT
NS IRRIG 1000ML POUR BTL (IV SOLUTION) ×1 IMPLANT
PACK GENERAL/GYN (CUSTOM PROCEDURE TRAY) ×1 IMPLANT
PAD ABD 8X10 STRL (GAUZE/BANDAGES/DRESSINGS) IMPLANT
PAD ARMBOARD 7.5X6 YLW CONV (MISCELLANEOUS) ×1 IMPLANT
PENCIL SMOKE EVACUATOR (MISCELLANEOUS) ×1 IMPLANT
SPECIMEN JAR X LARGE (MISCELLANEOUS) ×1 IMPLANT
SUT ETHILON 2 0 FS 18 (SUTURE) IMPLANT
SUT ETHILON 3 0 FSL (SUTURE) ×1 IMPLANT
SUT MNCRL AB 4-0 PS2 18 (SUTURE) ×1 IMPLANT
SUT VIC AB 3-0 SH 18 (SUTURE) ×1 IMPLANT
TOWEL GREEN STERILE (TOWEL DISPOSABLE) ×1 IMPLANT
TOWEL GREEN STERILE FF (TOWEL DISPOSABLE) ×1 IMPLANT

## 2022-11-18 NOTE — Interval H&P Note (Signed)
History and Physical Interval Note:  11/18/2022 7:14 AM  Marissa Reese  has presented today for surgery, with the diagnosis of LEFT DCIS, FAMILY HX BREAST CANCER.  The various methods of treatment have been discussed with the patient and family. After consideration of risks, benefits and other options for treatment, the patient has consented to  Procedure(s): BILATERAL SIMPLE MASTECTOMY (Bilateral) as a surgical intervention.  The patient's history has been reviewed, patient examined, no change in status, stable for surgery.  I have reviewed the patient's chart and labs.  Questions were answered to the patient's satisfaction.    The surgical and non surgical options have been discussed with the patient.  Risks of surgery include bleeding,  Infection,  Flap necrosis,  Tissue loss,  Chronic pain, death, Numbness,  And the need for additional procedures.  Reconstruction options also have been discussed with the patient as well.  The patient agrees to proceed.  Misao Fackrell A Jacyln Carmer

## 2022-11-18 NOTE — Progress Notes (Signed)
Pt arrived to room 5N16 via bed after surgery. See assessment. Will continue to monitor.

## 2022-11-18 NOTE — Transfer of Care (Signed)
Immediate Anesthesia Transfer of Care Note  Patient: Marissa Reese  Procedure(s) Performed: BILATERAL SIMPLE MASTECTOMY (Bilateral: Breast)  Patient Location: PACU  Anesthesia Type:General and Regional  Level of Consciousness: awake, alert , and oriented  Airway & Oxygen Therapy: Patient Spontanous Breathing  Post-op Assessment: Report given to RN and Post -op Vital signs reviewed and stable  Post vital signs: Reviewed and stable  Last Vitals:  Vitals Value Taken Time  BP 117/70 11/18/22 0941  Temp    Pulse 93 11/18/22 0942  Resp 16 11/18/22 0942  SpO2 97 % 11/18/22 0942  Vitals shown include unvalidated device data.  Last Pain:  Vitals:   11/18/22 0618  TempSrc:   PainSc: 0-No pain         Complications: No notable events documented.

## 2022-11-18 NOTE — Op Note (Signed)
Preoperative diagnosis: Left breast DCIS with a family history of breast cancer  Postoperative diagnosis: Same  Procedure: Left simple mastectomy with injection of mag trace and right risk reducing mastectomy  Surgeon: Harriette Bouillon, MD  Anesthesia: General with lateral pectoral blocks  EBL: 20 cc  Specimen: Bilateral breast tissue to pathology  Drains: 219 round  IV fluids: Per anesthesia record  Indications for procedure: The patient is a 42 year old female with extensive left breast DCIS.  She has a history of breast cancer in her family and opted for left mastectomy with right risk reducing mastectomy after reviewing all of her options including breast conserving surgery with reconstruction and mastectomy.  Due to the large area of involvement she wished to have a mastectomy and not pursue breast conserving surgery.The surgical and non surgical options have been discussed with the patient.  Risks of surgery include bleeding,  Infection,  Flap necrosis,  Tissue loss,  Chronic pain, death, Numbness,  And the need for additional procedures.  Reconstruction options also have been discussed with the patient as well.  The patient agrees to proceed.    Description of procedure: The patient was met in the holding area and questions were answered.  She underwent bilateral pectoral blocks per anesthesia protocol.  She was then taken back to the operative room.  She was placed supine upon the operating room table.  After induction of general anesthesia, a timeout was performed and 2 cc of MAC tracer injected into the left breast.  Both breasts were then prepped and draped in sterile fashion and a second timeout performed.  The right mastectomy was performed first.  A curved linear incision was made above and below the nipple areolar complex in a fishmouth configuration.  A superior skin flap was taken to the clavicle, it was taken more medially to the sternum, taken down to the inframammary fold and  out toward the lateral attachments of the breast.  The breast was then dissected off the chest wall in a medial to lateral fashion using cautery for hemostasis.  Once the lateral attachments were identified they were divided and the specimen was oriented and sent to pathology.  A TXA sponge was placed in the wound.  The left side was done in a similar fashion.  A fishmouth configuration incision was made above and below the nipple areolar complex.  A superior skin flap was taken the clavicle, sternum medially, toward the inframammary fold and toward the lateral attachments.  The breast was then dissected off the chest wall in a medial lateral fashion until the lateral attachments were identified and divided.  This was oriented as well with a single stitch of 2-0 nylon.  Irrigation was used.  TXA soaked sponge was placed for 5 minutes.  These were removed and hemostasis was assessed which was excellent.  Any oozing was controlled with cautery.  Through a separate stab incision 19 round drains were placed on both sides and secured to skin with 2-0 nylon.  We then closed both wounds with a combination of 3-0 Vicryl and 4 Monocryl.  Dermabond applied.  Breast binder placed.  All counts found to be correct.  The patient was awoke extubated taken to recovery in satisfactory condition.

## 2022-11-18 NOTE — Anesthesia Procedure Notes (Signed)
Procedure Name: LMA Insertion Date/Time: 11/18/2022 7:35 AM  Performed by: Lelon Perla, CRNAPre-anesthesia Checklist: Patient identified, Emergency Drugs available, Suction available and Patient being monitored Patient Re-evaluated:Patient Re-evaluated prior to induction Oxygen Delivery Method: Circle System Utilized Preoxygenation: Pre-oxygenation with 100% oxygen Induction Type: IV induction Ventilation: Mask ventilation without difficulty LMA: LMA inserted LMA Size: 4.0 Number of attempts: 1 Airway Equipment and Method: Bite block Placement Confirmation: positive ETCO2 Tube secured with: Tape Dental Injury: Teeth and Oropharynx as per pre-operative assessment

## 2022-11-18 NOTE — Anesthesia Postprocedure Evaluation (Signed)
Anesthesia Post Note  Patient: Marissa Reese  Procedure(s) Performed: BILATERAL SIMPLE MASTECTOMY (Bilateral: Breast)     Patient location during evaluation: PACU Anesthesia Type: General Level of consciousness: awake Pain management: pain level controlled Vital Signs Assessment: post-procedure vital signs reviewed and stable Respiratory status: spontaneous breathing, nonlabored ventilation and respiratory function stable Cardiovascular status: blood pressure returned to baseline and stable Postop Assessment: no apparent nausea or vomiting Anesthetic complications: no   No notable events documented.  Last Vitals:  Vitals:   11/18/22 1145 11/18/22 1200  BP: 102/67 102/65  Pulse: 77 83  Resp: 14 16  Temp: 36.9 C   SpO2: 94% 96%    Last Pain:  Vitals:   11/18/22 1145  TempSrc:   PainSc: Asleep   Pain Goal:                   Linton Rump

## 2022-11-18 NOTE — Discharge Instructions (Signed)
[facility] [phone]  MASTECTOMY: POST OP INSTRUCTIONS  Always review your discharge instruction sheet given to you by the facility where your surgery was performed. IF YOU HAVE DISABILITY OR FAMILY LEAVE FORMS, YOU MUST BRING THEM TO THE OFFICE FOR PROCESSING.   DO NOT GIVE THEM TO YOUR DOCTOR. A prescription for pain medication may be given to you upon discharge.  Take your pain medication as prescribed, if needed.  If narcotic pain medicine is not needed, then you may take acetaminophen (Tylenol) or ibuprofen (Advil) as needed. Take your usually prescribed medications unless otherwise directed. If you need a refill on your pain medication, please contact your pharmacy.  They will contact our office to request authorization.  Prescriptions will not be filled after 5pm or on week-ends. You should follow a light diet the first few days after arrival home, such as soup and crackers, etc.  Resume your normal diet the day after surgery. Most patients will experience some swelling and bruising on the chest and underarm.  Ice packs will help.  Swelling and bruising can take several days to resolve.  It is common to experience some constipation if taking pain medication after surgery.  Increasing fluid intake and taking a stool softener (such as Colace) will usually help or prevent this problem from occurring.  A mild laxative (Milk of Magnesia or Miralax) should be taken according to package instructions if there are no bowel movements after 48 hours. Unless discharge instructions indicate otherwise, leave your bandage dry and in place until your next appointment in 3-5 days.  You may take a limited sponge bath.  No tube baths or showers until the drains are removed.  You may have steri-strips (small skin tapes) in place directly over the incision.  These strips should be left on the skin for 7-10 days.  If your surgeon used skin glue on the incision, you may shower in 24 hours.   The glue will flake off over the next 2-3 weeks.  Any sutures or staples will be removed at the office during your follow-up visit. DRAINS:  If you have drains in place, it is important to keep a list of the amount of drainage produced each day in your drains.  Before leaving the hospital, you should be instructed on drain care.  Call our office if you have any questions about your drains. ACTIVITIES:  You may resume regular (light) daily activities beginning the next day--such as daily self-care, walking, climbing stairs--gradually increasing activities as tolerated.  You may have sexual intercourse when it is comfortable.  Refrain from any heavy lifting or straining until approved by your doctor. You may drive when you are no longer taking prescription pain medication, you can comfortably wear a seatbelt, and you can safely maneuver your car and apply brakes. RETURN TO WORK:  __________________________________________________________ You should see your doctor in the office for a follow-up appointment approximately 3-5 days after your surgery.  Your doctor's nurse will typically make your follow-up appointment when she calls you with your pathology report.  Expect your pathology report 2-3 business days after your surgery.  You may call to check if you do not hear from us after three days.   OTHER INSTRUCTIONS: ______________________________________________________________________________________________ ____________________________________________________________________________________________ WHEN TO CALL YOUR DOCTOR: Fever over 101.0 Nausea and/or vomiting Extreme swelling or bruising Continued bleeding from incision. Increased pain, redness, or drainage from the incision. The clinic staff is available to answer your questions during regular business hours.  Please don't hesitate   to call and ask to speak to one of the nurses for clinical concerns.  If you have a medical emergency, go to the  nearest emergency room or call 911.  A surgeon from [facility] is always on call at the hospital. [address], [address], [place], [place]  [address] ? [address] [address], , [place]   [address] [phone] ? [phone] ? FAX [phone] Web site: [email or web address]  

## 2022-11-18 NOTE — Plan of Care (Signed)
  Problem: Pain Managment: Goal: General experience of comfort will improve Outcome: Progressing   Problem: Safety: Goal: Ability to remain free from injury will improve Outcome: Progressing   Problem: Skin Integrity: Goal: Risk for impaired skin integrity will decrease Outcome: Progressing   

## 2022-11-18 NOTE — Anesthesia Procedure Notes (Signed)
Anesthesia Regional Block: Pectoralis block   Pre-Anesthetic Checklist: , timeout performed,  Correct Patient, Correct Site, Correct Laterality,  Correct Procedure, Correct Position, site marked,  Risks and benefits discussed,  Surgical consent,  Pre-op evaluation,  At surgeon's request and post-op pain management  Laterality: Left and Right  Prep: chloraprep       Needles:  Injection technique: Single-shot  Needle Type: Echogenic Stimulator Needle     Needle Length: 9cm  Needle Gauge: 21     Additional Needles:   Procedures:,,,, ultrasound used (permanent image in chart),,    Narrative:  Start time: 11/18/2022 7:00 AM End time: 11/18/2022 7:10 AM Injection made incrementally with aspirations every 5 mL.  Performed by: Personally  Anesthesiologist: Linton Rump, MD  Additional Notes: Discussed risks and benefits of nerve block including, but not limited to, prolonged and/or permanent nerve injury involving sensory and/or motor function. Monitors were applied and a time-out was performed. The nerve and associated structures were visualized under ultrasound guidance. After negative aspiration, local anesthetic was slowly injected around the nerve. There was no evidence of high pressure during the procedure. There were no paresthesias. VSS remained stable and the patient tolerated the procedure well.

## 2022-11-19 ENCOUNTER — Encounter (HOSPITAL_COMMUNITY): Payer: Self-pay | Admitting: Surgery

## 2022-11-19 DIAGNOSIS — D0512 Intraductal carcinoma in situ of left breast: Secondary | ICD-10-CM | POA: Diagnosis not present

## 2022-11-19 MED ORDER — METHOCARBAMOL 750 MG PO TABS: 750.0000 mg | ORAL_TABLET | Freq: Three times a day (TID) | ORAL | 0 refills | Status: AC | PRN

## 2022-11-19 MED ORDER — OXYCODONE HCL 5 MG PO TABS
5.0000 mg | ORAL_TABLET | Freq: Four times a day (QID) | ORAL | 0 refills | Status: AC | PRN
Start: 2022-11-19 — End: ?

## 2022-11-19 NOTE — Progress Notes (Signed)
Marissa Reese to be D/C'd home per MD order. Discussed with the patient and husband and all questions fully answered.  Skin clean and dry without evidence of skin break down, no evidence of skin tears noted. Surgical site clean and intact and family educated and voiced understanding on emptying JP drain. IV catheter discontinued intact. Site without signs and symptoms of complications. Dressing and pressure applied.  An After Visit Summary was printed and given to the patient.   [name]  11/19/2022

## 2022-11-19 NOTE — Discharge Summary (Signed)
Physician Discharge Summary  Patient ID: Marissa Reese MRN: [id number] DOB/AGE: [birth year] 42 y.o.  Admit date: 11/18/2022 Discharge date: 11/19/2022  Admission Diagnoses:DCIS LEFT   Discharge Diagnoses:  Principal Problem:   Ductal carcinoma in situ (DCIS) of left breast   Discharged Condition: good  Hospital Course: PT DID WELL AFTER BILATERAL MASTECTOMY  SHE HAD NO POST OP ISSUES   Consults: None    Treatments: surgery: BILATERAL SIMPLE MASTECTOMY   Discharge Exam: Blood pressure (!) 89/52, pulse 71, temperature 98.5 F (36.9 C), resp. rate 16, height 5' (1.524 m), weight 63.5 kg, last menstrual period 11/13/2022, SpO2 97 %. General appearance: alert and cooperative Resp: clear to auscultation bilaterally Incision/Wound:INCISIONS CDI no hematoma or flap necrosis JP serous and appropriate   Disposition: Discharge disposition: 01-Home or Self Care       Discharge Instructions     Diet - low sodium heart healthy   Complete by: As directed    Increase activity slowly   Complete by: As directed       Allergies as of 11/19/2022       Reactions   Zolpidem Tartrate Other (See Comments)    Hallucinations    Latex Itching, Rash        Medication List     TAKE these medications    acetaminophen 500 MG tablet Commonly known as: TYLENOL Take 1,000 mg by mouth as needed for moderate pain.   amphetamine-dextroamphetamine 30 MG tablet Commonly known as: ADDERALL Take 30 mg by mouth 2 (two) times daily.   buPROPion 150 MG 24 hr tablet Commonly known as: WELLBUTRIN XL Take 150 mg by mouth daily.   escitalopram 10 MG tablet Commonly known as: LEXAPRO TAKE ONE (1) TABLET BY MOUTH EVERY DAY What changed: how much to take   ibuprofen 200 MG tablet Commonly known as: ADVIL Take 800 mg by mouth as needed for moderate pain.   lisinopril-hydrochlorothiazide 10-12.5 MG tablet Commonly known as: ZESTORETIC Take 1 tablet by mouth daily.   methocarbamol  750 MG tablet Commonly known as: Robaxin-750 Take 1 tablet (750 mg total) by mouth every 8 (eight) hours as needed for up to 7 days for muscle spasms.   ondansetron 4 MG tablet Commonly known as: ZOFRAN Take 4 mg by mouth as needed for vomiting or nausea.   oxyCODONE 5 MG immediate release tablet Commonly known as: Oxy IR/ROXICODONE Take 1 tablet (5 mg total) by mouth every 6 (six) hours as needed for severe pain.   promethazine 25 MG suppository Commonly known as: PHENERGAN Place 1 suppository (25 mg total) rectally every 6 (six) hours as needed for nausea or vomiting. What changed: when to take this   rizatriptan 10 MG tablet Commonly known as: MAXALT Take 10 mg by mouth as needed for migraine.         Signed: Clovis Pu Tytionna Cloyd 11/19/2022, 8:54 AM

## 2022-11-22 ENCOUNTER — Encounter: Payer: Self-pay | Admitting: Surgery

## 2022-11-22 LAB — SURGICAL PATHOLOGY

## 2022-11-25 ENCOUNTER — Encounter: Payer: Self-pay | Admitting: *Deleted

## 2022-11-25 ENCOUNTER — Other Ambulatory Visit: Payer: PRIVATE HEALTH INSURANCE

## 2022-11-25 ENCOUNTER — Encounter: Payer: PRIVATE HEALTH INSURANCE | Admitting: Genetic Counselor

## 2022-11-25 DIAGNOSIS — D0512 Intraductal carcinoma in situ of left breast: Secondary | ICD-10-CM

## 2022-11-29 ENCOUNTER — Inpatient Hospital Stay: Payer: PRIVATE HEALTH INSURANCE | Attending: Hematology and Oncology | Admitting: Hematology and Oncology

## 2022-11-29 ENCOUNTER — Other Ambulatory Visit: Payer: Self-pay

## 2022-11-29 VITALS — BP 145/97 | HR 84 | Temp 97.9°F | Resp 18 | Ht 60.0 in | Wt 136.5 lb

## 2022-11-29 DIAGNOSIS — Z9013 Acquired absence of bilateral breasts and nipples: Secondary | ICD-10-CM | POA: Diagnosis not present

## 2022-11-29 DIAGNOSIS — D0512 Intraductal carcinoma in situ of left breast: Secondary | ICD-10-CM | POA: Diagnosis not present

## 2022-11-29 NOTE — Assessment & Plan Note (Signed)
11/18/2022: Bilateral mastectomies:  Left mastectomy: Grade 2-3 DCIS micropapillary and cribriform types with comedonecrosis measuring 12 cm, margins negative, 0/1 lymph node, ER 95%, PR 0% Right mastectomy: Benign  Pathology counseling: I discussed the final pathology report of the patient provided  a copy of this report. I discussed the margins.  We also discussed the final staging along with previously performed ER/PR testing.  Treatment plan: No role of any additional systemic therapy since she had bilateral mastectomies and final pathology was DCIS.  Return to clinic in 3 months for survivorship care plan visit and after that she could be followed by long-term survivorship.

## 2023-01-07 ENCOUNTER — Telehealth: Payer: Self-pay | Admitting: Adult Health

## 2023-01-07 NOTE — Telephone Encounter (Signed)
Left Patient a message in regards to upcoming appointment times/dates

## 2023-04-08 ENCOUNTER — Inpatient Hospital Stay: Payer: PRIVATE HEALTH INSURANCE | Attending: Adult Health | Admitting: Adult Health
